# Patient Record
Sex: Female | Born: 1965 | Race: Black or African American | Hispanic: No | Marital: Married | Smoking: Never smoker
Health system: Southern US, Community
[De-identification: ages and names within clinical notes are randomized; demographics above are authoritative.]

## PROBLEM LIST (undated history)

## (undated) DIAGNOSIS — I10 Essential (primary) hypertension: Secondary | ICD-10-CM

## (undated) DIAGNOSIS — C50919 Malignant neoplasm of unspecified site of unspecified female breast: Secondary | ICD-10-CM

## (undated) DIAGNOSIS — H547 Unspecified visual loss: Secondary | ICD-10-CM

## (undated) DIAGNOSIS — N189 Chronic kidney disease, unspecified: Secondary | ICD-10-CM

## (undated) DIAGNOSIS — D571 Sickle-cell disease without crisis: Secondary | ICD-10-CM

## (undated) DIAGNOSIS — D649 Anemia, unspecified: Secondary | ICD-10-CM

## (undated) HISTORY — DX: Chronic kidney disease, unspecified: N18.9

## (undated) HISTORY — DX: Malignant neoplasm of unspecified site of unspecified female breast: C50.919

## (undated) HISTORY — PX: COLONOSCOPY: SHX174

## (undated) HISTORY — DX: Sickle-cell disease without crisis: D57.1

## (undated) HISTORY — PX: GANGLION CYST EXCISION: SHX1691

## (undated) HISTORY — PX: EYE SURGERY: SHX253

## (undated) HISTORY — PX: RETINAL DETACHMENT SURGERY: SHX105

---

## 1998-10-23 ENCOUNTER — Other Ambulatory Visit: Admission: RE | Admit: 1998-10-23 | Discharge: 1998-10-23 | Payer: Self-pay | Admitting: Obstetrics

## 1999-08-12 ENCOUNTER — Encounter: Payer: Self-pay | Admitting: Cardiology

## 1999-08-12 ENCOUNTER — Ambulatory Visit (HOSPITAL_COMMUNITY): Admission: RE | Admit: 1999-08-12 | Discharge: 1999-08-12 | Payer: Self-pay | Admitting: Cardiology

## 2000-05-19 ENCOUNTER — Other Ambulatory Visit: Admission: RE | Admit: 2000-05-19 | Discharge: 2000-05-19 | Payer: Self-pay | Admitting: Obstetrics

## 2000-05-27 ENCOUNTER — Encounter: Payer: Self-pay | Admitting: Obstetrics

## 2000-05-27 ENCOUNTER — Ambulatory Visit (HOSPITAL_COMMUNITY): Admission: RE | Admit: 2000-05-27 | Discharge: 2000-05-27 | Payer: Self-pay | Admitting: Obstetrics

## 2000-05-28 ENCOUNTER — Encounter (INDEPENDENT_AMBULATORY_CARE_PROVIDER_SITE_OTHER): Payer: Self-pay

## 2000-05-28 ENCOUNTER — Ambulatory Visit (HOSPITAL_COMMUNITY): Admission: RE | Admit: 2000-05-28 | Discharge: 2000-05-28 | Payer: Self-pay | Admitting: Obstetrics

## 2000-06-05 ENCOUNTER — Encounter: Admission: RE | Admit: 2000-06-05 | Discharge: 2000-06-05 | Payer: Self-pay | Admitting: Internal Medicine

## 2000-06-05 ENCOUNTER — Encounter: Payer: Self-pay | Admitting: Internal Medicine

## 2000-09-28 ENCOUNTER — Emergency Department (HOSPITAL_COMMUNITY): Admission: EM | Admit: 2000-09-28 | Discharge: 2000-09-28 | Payer: Self-pay | Admitting: Emergency Medicine

## 2000-12-24 ENCOUNTER — Encounter: Admission: RE | Admit: 2000-12-24 | Discharge: 2000-12-24 | Payer: Self-pay | Admitting: Gynecology

## 2000-12-24 ENCOUNTER — Encounter: Payer: Self-pay | Admitting: Gynecology

## 2003-11-24 ENCOUNTER — Emergency Department (HOSPITAL_COMMUNITY): Admission: EM | Admit: 2003-11-24 | Discharge: 2003-11-24 | Payer: Self-pay | Admitting: Emergency Medicine

## 2007-06-23 ENCOUNTER — Other Ambulatory Visit: Admission: RE | Admit: 2007-06-23 | Discharge: 2007-06-23 | Payer: Self-pay | Admitting: Gynecology

## 2007-06-28 ENCOUNTER — Ambulatory Visit (HOSPITAL_COMMUNITY): Admission: RE | Admit: 2007-06-28 | Discharge: 2007-06-28 | Payer: Self-pay | Admitting: Gynecology

## 2008-03-10 ENCOUNTER — Emergency Department (HOSPITAL_COMMUNITY): Admission: EM | Admit: 2008-03-10 | Discharge: 2008-03-10 | Payer: Self-pay | Admitting: Emergency Medicine

## 2009-12-07 ENCOUNTER — Ambulatory Visit (HOSPITAL_COMMUNITY): Admission: RE | Admit: 2009-12-07 | Discharge: 2009-12-07 | Payer: Self-pay | Admitting: Obstetrics & Gynecology

## 2011-02-04 ENCOUNTER — Other Ambulatory Visit: Payer: Self-pay | Admitting: Family Medicine

## 2011-02-04 DIAGNOSIS — Z1231 Encounter for screening mammogram for malignant neoplasm of breast: Secondary | ICD-10-CM

## 2011-02-07 ENCOUNTER — Ambulatory Visit (HOSPITAL_COMMUNITY)
Admission: RE | Admit: 2011-02-07 | Discharge: 2011-02-07 | Disposition: A | Discharge status: Home / Self Care | Payer: Medicaid Other | Source: Ambulatory Visit | Attending: Family Medicine | Admitting: Family Medicine

## 2011-02-07 DIAGNOSIS — Z1231 Encounter for screening mammogram for malignant neoplasm of breast: Secondary | ICD-10-CM | POA: Insufficient documentation

## 2011-04-18 NOTE — Op Note (Signed)
[facility]  Patient:    Sarah Kline                      MRN: [id number] Proc. Date: 05/28/00 Adm. Date:  [id number] Attending:  Beatrix Fetters                           Operative Report  PREOPERATIVE DIAGNOSIS:       Dysfunctional uterine bleeding.  POSTOPERATIVE DIAGNOSIS:  OPERATION:                    Diagnostic D&C.  SURGEON:                      Frederico Hamman, M.D.  ASSISTANT:  ANESTHESIA:                   General anesthesia.  ESTIMATED BLOOD LOSS:  DESCRIPTION OF PROCEDURE:     Under general anesthesia with the patient in the lithotomy position, the perineum and vagina were prepped and draped.  The bladder was emptied with a straight catheter.  Bimanual examination revealed the uterus to be 12 to 14 weeks size.  Weighted speculum was placed in the vagina.  The cervix injected with 1% Xylocaine at 3 and 9 oclock.  The endocervical canal was curetted.  A small amount of tissue obtained.  Endometrial cavity sounded to 10 cm and the cervix dilated to #29 [name].  Sharp curettage was performed.  A moderate amount of tissue obtained.  Polyp forceps were inserted and no polyps were obtained. The patient tolerated the procedure well and taken to the recovery room in good condition. DD:  05/28/00 TD:  05/28/00 Job: [id number]

## 2012-07-30 ENCOUNTER — Emergency Department (HOSPITAL_COMMUNITY)
Admission: EM | Admit: 2012-07-30 | Discharge: 2012-07-31 | Disposition: A | Discharge status: Home / Self Care | Payer: Medicaid Other | Attending: Emergency Medicine | Admitting: Emergency Medicine

## 2012-07-30 ENCOUNTER — Encounter (HOSPITAL_COMMUNITY): Payer: Self-pay | Admitting: Family Medicine

## 2012-07-30 DIAGNOSIS — R42 Dizziness and giddiness: Secondary | ICD-10-CM | POA: Insufficient documentation

## 2012-07-30 DIAGNOSIS — H532 Diplopia: Secondary | ICD-10-CM | POA: Insufficient documentation

## 2012-07-30 DIAGNOSIS — E119 Type 2 diabetes mellitus without complications: Secondary | ICD-10-CM | POA: Insufficient documentation

## 2012-07-30 DIAGNOSIS — R51 Headache: Secondary | ICD-10-CM | POA: Insufficient documentation

## 2012-07-30 LAB — BASIC METABOLIC PANEL
CO2: 27 mEq/L (ref 19–32)
Calcium: 9.5 mg/dL (ref 8.4–10.5)
Creatinine, Ser: 0.69 mg/dL (ref 0.50–1.10)
GFR calc Af Amer: >90 mL/min (ref >90)
GFR calc non Af Amer: >90 mL/min (ref >90)
Sodium: 135 mEq/L (ref 135–145)

## 2012-07-30 LAB — CBC
MCH: 29.1 pg (ref 26.0–34.0)
MCHC: 34.9 g/dL (ref 30.0–36.0)
MCV: 83.5 fL (ref 78.0–100.0)
Platelets: 322 10*3/uL (ref 150–400)
RBC: 4.12 MIL/uL (ref 3.87–5.11)
RDW: 11.9 % (ref 11.5–15.5)

## 2012-07-30 NOTE — ED Notes (Signed)
Pt waiting quietly, no complaints

## 2012-07-30 NOTE — ED Notes (Signed)
Resident at bedside.  

## 2012-07-30 NOTE — ED Notes (Addendum)
Patient complaining of dizziness upon standing, blurry/double vision, and left sided facial pain for the last two days.  Describes pain as "pressure" or a "weight"; rates pain 6/10.  Patient reports that most the pain feels that it is behind the eye.  Patient feels that she can see better out of her right eye than her left eye.  Patient denies weakness, sensory loss, chest pain, shortness of breath, and nausea/vomiting.  Patient alert and oriented x4; PERRL present.  Hand grips are bilaterally equal and strong.  No facial droop present; smile symmetrical.  Denies increased urination; denies increased thirst.  iNo slurred speech noted.  Denies history of diabetes.  Reports family history of stroke (mother).  Family present at bedside.  Will continue to monitor.

## 2012-07-30 NOTE — ED Notes (Signed)
Per pt she has had double vision x 2 days. Pt also having pain in left eye, face and pressure.

## 2012-07-30 NOTE — ED Provider Notes (Signed)
History     CSN: [id number]  Arrival date & time 07/30/12  1803   First MD Initiated Contact with Patient 07/30/12 2203      Chief Complaint  Patient presents with  . Diplopia    (Consider location/radiation/quality/duration/timing/severity/associated sxs/prior treatment) HPI This is a 46 year old woman, with a past medical history of untreated diabetes for the last 4 years, who presents with "pressure behind her left eye." The symptoms started suddenly yesterday, when she was driving her car and they were associated with sensations of tingling on the left side of her face. She also reports in seeing double in her eyes and reduced vision in both eyes. However, the left eye is more affected than the right. She denies difficulty with speech, tremors, facial asymmetry, or weakness in and of her limbs. She reports feeling dizzy on standing out, but she has not passed out. She has no previous history of a stroke. She denies headache. The symptoms have remained the same since yesterday. She does not have history of similar symptoms.  She has not been seeing doctors since she lost her job 4 years ago. However, she reports that she has been feeling well despite no treatment for Diabetes.   Past Medical History  Diagnosis Date  . Diabetes mellitus     History reviewed. No pertinent past surgical history.  History reviewed. No pertinent family history.  History  Substance Use Topics  . Smoking status: Never Smoker   . Smokeless tobacco: Not on file  . Alcohol Use: No    OB History    Grav Para Term Preterm Abortions TAB SAB Ect Mult Living                  Review of Systems  Constitutional: Negative.   Eyes: Negative.   Respiratory: Negative for stridor.   Cardiovascular: Negative.   Gastrointestinal: Negative.   Genitourinary: Negative.   Musculoskeletal: Negative.   Skin: Negative.   Neurological: Negative.   Hematological: Negative.   Psychiatric/Behavioral: Negative.       Allergies  Bactrim  Home Medications  No current outpatient prescriptions on file.  BP 170/76  Pulse 86  Temp 98.1 F (36.7 C) (Oral)  Resp 16  SpO2 99%  LMP 07/17/2012  Physical Exam  Constitutional: She is oriented to person, place, and time. She appears well-developed and well-nourished.  HENT:  Head: Normocephalic and atraumatic.  Eyes: Conjunctivae and EOM are normal. Pupils are equal, round, and reactive to light.  Neck: Normal range of motion.  Cardiovascular: Normal rate, regular rhythm and normal heart sounds.   Pulmonary/Chest: Effort normal and breath sounds normal.  Abdominal: Soft. Bowel sounds are normal.  Neurological: She is alert and oriented to person, place, and time. She has normal strength. She displays no tremor. No cranial nerve deficit or sensory deficit. She displays no seizure activity. GCS eye subscore is 4. GCS verbal subscore is 5. GCS motor subscore is 6.  Skin: Skin is warm and dry.  Psychiatric: She has a normal mood and affect.    ED Course  Procedures (including critical care time)  Labs Reviewed  CBC - Abnormal; Notable for the following:    HCT 34.4 (*)     All other components within normal limits  BASIC METABOLIC PANEL - Abnormal; Notable for the following:    Glucose, Bld 344 (*)     All other components within normal limits   Ct Head Wo Contrast  07/31/2012  *RADIOLOGY REPORT*  Clinical  Data: Blurry vision, dizziness and left-sided facial pain.  CT HEAD WITHOUT CONTRAST  Technique:  Contiguous axial images were obtained from the base of the skull through the vertex without contrast.  Comparison: None.  Findings: The lateral ventricles and third ventricle are somewhat prominent without evidence of surrounding edema.  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  No evidence of infarction, hemorrhage or extra-axial fluid collection.  No visible mass lesions.  The skull is  within normal limits.  IMPRESSION: Prominent lateral ventricles and third ventricle.  This is of unclear significance.  There is no evidence to suggest acute transependymal edema.  Prior imaging would be helpful, if available.   Original Report Authenticated By: Azzie Roup, M.D.      No diagnosis found.    MDM  In this 46 year old woman, who presents with unspecific symptoms including pressure behind her left eye, and numbness/tingling on the left side of her face, with a pretty normal physical examination, there is a remote possibility for this to be a stroke. Patient however, doesn't have significant risk factors except untreated diabetes. Her ABCD2 Score is 4 which indicates a 7 day risk of a stroke of 5.9%.  I would do brain CT scan. Patient has declined a CT scan even though she understands the risk for a stroke. She has been found to have high blood sugar. I have advised her to call the numbers provide in her discharge documents for a Primary care physician.  12:39 AM Negative Brain CT scan.  I have discharged her. She will follow up with an ophthalmologist Dr Zadie Rhine for more evaluation.           Jessee Avers, MD 07/31/12 [phone]

## 2012-07-31 ENCOUNTER — Emergency Department (HOSPITAL_COMMUNITY): Payer: Medicaid Other

## 2012-07-31 MED ORDER — OXYCODONE-ACETAMINOPHEN 7.5-500 MG PO TABS: 1.0000 | ORAL_TABLET | ORAL | Status: AC | PRN

## 2012-07-31 MED ORDER — OXYCODONE-ACETAMINOPHEN 5-325 MG PO TABS
2.0000 | ORAL_TABLET | Freq: Once | ORAL | Status: AC
  Administered 2012-07-31: 2 via ORAL
  Filled 2012-07-31: qty 2

## 2012-07-31 MED ORDER — BENAZEPRIL-HYDROCHLOROTHIAZIDE 10-12.5 MG PO TABS: 1.0000 | ORAL_TABLET | Freq: Every day | ORAL | Status: DC

## 2012-07-31 NOTE — ED Notes (Signed)
Resident at bedside speaking with pt.  Awaiting Rx for blood pressure medication.

## 2012-07-31 NOTE — ED Provider Notes (Signed)
Medical screening examination/treatment/procedure(s) were conducted as a shared visit with non-physician practitioner(s) and myself.  I personally evaluated the patient during the encounter.  No stroke-like findings reproduced on exam.  No clinical evidence of acute glaucoma.  Plan d/c home.  Perlie Mayo, MD 07/31/12 [phone]

## 2012-07-31 NOTE — ED Notes (Signed)
Resident states pt ready for discharge.  Informed resident of pt's BP and no additional orders received.  States pt can be discharged.

## 2012-11-12 ENCOUNTER — Ambulatory Visit (INDEPENDENT_AMBULATORY_CARE_PROVIDER_SITE_OTHER): Payer: Self-pay | Admitting: Family Medicine

## 2012-11-12 ENCOUNTER — Encounter: Payer: Self-pay | Admitting: Family Medicine

## 2012-11-12 VITALS — BP 168/86 | HR 100 | Temp 99.1°F | Ht 64.0 in | Wt 225.3 lb

## 2012-11-12 DIAGNOSIS — E119 Type 2 diabetes mellitus without complications: Secondary | ICD-10-CM | POA: Insufficient documentation

## 2012-11-12 DIAGNOSIS — H4920 Sixth [abducent] nerve palsy, unspecified eye: Secondary | ICD-10-CM | POA: Insufficient documentation

## 2012-11-12 DIAGNOSIS — I10 Essential (primary) hypertension: Secondary | ICD-10-CM | POA: Insufficient documentation

## 2012-11-12 DIAGNOSIS — E1165 Type 2 diabetes mellitus with hyperglycemia: Secondary | ICD-10-CM | POA: Insufficient documentation

## 2012-11-12 LAB — POCT GLYCOSYLATED HEMOGLOBIN (HGB A1C): Hemoglobin A1C: 7.7

## 2012-11-12 MED ORDER — LISINOPRIL-HYDROCHLOROTHIAZIDE 10-12.5 MG PO TABS: 1.0000 | ORAL_TABLET | Freq: Every day | ORAL | Status: DC

## 2012-11-12 MED ORDER — GLIPIZIDE 5 MG PO TABS: 5.0000 mg | ORAL_TABLET | Freq: Every day | ORAL | Status: DC

## 2012-11-12 MED ORDER — METFORMIN HCL 500 MG PO TABS: 1500.0000 mg | ORAL_TABLET | Freq: Every day | ORAL | Status: DC

## 2012-11-12 NOTE — Patient Instructions (Signed)
Thank you for coming in, today! It was nice to meet you. For now, keep taking your metformin and glipizide as before. We might adjust it in the future. We will check an A1c and your kidney and liver function labs, today. Make a lab appointment to come back next week for a fasting lab draw (come in the morning before breakfast and do not eat or drink anything after midnight). Make an appointment to come back to see me a week or two after that. If you have any questions/concerns, please feel free to call at any time. --Dr. Venetia Maxon

## 2012-11-12 NOTE — Assessment & Plan Note (Signed)
A: Uncontrolled, not currently taking any medications. Elevated today, systolic to [id number], diastolic to [id number]. P: Rx for lisinopril-HCTZ given. Will check CMP today for renal function baseline, then recheck in 1 week to assess for increased Cr or K. Will follow closely and may consider referral to Dr. Valentina Lucks for ambulatory monitoring.

## 2012-11-12 NOTE — Assessment & Plan Note (Signed)
A/P: Not specifically evaluated by me, today. Pt to keep following up with ophthalmology. Will request records/re-evaluate at follow-up, here.

## 2012-11-12 NOTE — Assessment & Plan Note (Signed)
A: Unclear control, but documented blood glucose over 300 in ED in August. Reports compliance with metformin but is only taking it once per day (non-XL formulation). Also on glipizide. P: Will check A1c today and fasting lipid panel at lab draw appointment for recheck of renal function after starting ACE (see HTN problem list note). Will adjust medications as appropriate.

## 2012-11-12 NOTE — Progress Notes (Signed)
  Subjective:    Patient ID: Sarah Kline, female    DOB: 10/20/1966, 46 y.o.   MRN: [id number]  HPI: Pt is a 46yo female presenting to establish care. She has not seen a physician in several years.  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 She denies headaches, chest pain, SOB, or swelling in her LE.  DM - Pt has known type 2 DM. She does not check her sugar at home regularly; her daughter has DM and a meter, and she occasionally uses her daughter's meter. She reports compliance with metformin 1500 mg once per day (regular, not XR) and glipizide 5 mg once per day. She takes these medications at night and does not report GI upset with the metformin. 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 Of note, BP was elevated at the time and pt had a headache with double vision/blurred vision. She is being followed by ophthalmology and had to cancel an appointment last week, which is to be rescheduled. She wears a left eye patch at home and glasses with a left darkened lens when she drives.  Review of Systems: As above. Otherwise denies fever/chills, N/V, but does have chronic constipation, with only "a couple BM's a week, usually." She states she occasionally breaks into a sweat when she eats, only in her face and head. She also complains of occasional numbness in the 4th and 5th digits of her left hand, usually worse when she wakes up.     Objective:   Physical Exam BP 156/102  Pulse 100  Temp 99.1 F (37.3 C) (Oral)  Ht 5\' 4"  (1.626 m)  Wt 225 lb 4.8 oz (102.195 kg)  BMI 38.67 kg/m2 BP rechecked manually by me to be 168/86,  172/88. Gen: well-appearing adult female in NAD, overweight Pulm: CTAB, no wheezes Card: RRR, soft blowing systolic murmur (known to pt, states she has had it "since she was a child") Abd: Soft, nontender, nondistended, BS+ Ext: distal pulses 2+ and intact/symmetric     Assessment & Plan:

## 2012-11-13 LAB — COMPREHENSIVE METABOLIC PANEL
ALT: 14 U/L (ref 0–35)
Alkaline Phosphatase: 97 U/L (ref 39–117)
Creat: 0.74 mg/dL (ref 0.50–1.10)
Sodium: 139 mEq/L (ref 135–145)
Total Bilirubin: 0.5 mg/dL (ref 0.3–1.2)
Total Protein: 6.6 g/dL (ref 6.0–8.3)

## 2012-11-19 ENCOUNTER — Encounter: Payer: Self-pay | Admitting: Emergency Medicine

## 2012-11-19 ENCOUNTER — Ambulatory Visit (INDEPENDENT_AMBULATORY_CARE_PROVIDER_SITE_OTHER): Payer: Self-pay | Admitting: Emergency Medicine

## 2012-11-19 VITALS — BP 155/95 | HR 91 | Ht 64.0 in | Wt 218.0 lb

## 2012-11-19 DIAGNOSIS — I10 Essential (primary) hypertension: Secondary | ICD-10-CM

## 2012-11-19 DIAGNOSIS — E1165 Type 2 diabetes mellitus with hyperglycemia: Secondary | ICD-10-CM

## 2012-11-19 LAB — BASIC METABOLIC PANEL
BUN: 17 mg/dL (ref 6–23)
Chloride: 104 mEq/L (ref 96–112)
Creat: 0.91 mg/dL (ref 0.50–1.10)
Potassium: 4.3 mEq/L (ref 3.5–5.3)

## 2012-11-19 LAB — LIPID PANEL
Cholesterol: 190 mg/dL (ref 0–200)
LDL Cholesterol: 132 mg/dL — ABNORMAL HIGH (ref 0–99)
Total CHOL/HDL Ratio: 4 Ratio
VLDL: 11 mg/dL (ref 0–40)

## 2012-11-19 MED ORDER — METFORMIN HCL 500 MG PO TABS: 1000.0000 mg | ORAL_TABLET | Freq: Two times a day (BID) | ORAL | Status: DC

## 2012-11-19 MED ORDER — LISINOPRIL-HYDROCHLOROTHIAZIDE 20-25 MG PO TABS: 1.0000 | ORAL_TABLET | Freq: Every day | ORAL | Status: DC

## 2012-11-19 NOTE — Progress Notes (Signed)
  Subjective:    Patient ID: Sarah Kline, female    DOB: 10/14/1966, 46 y.o.   MRN: [id number]  HPI RONEE RISHER is here for f/u of HTN and DM.  Hypertension Well controlled: yes Compliant with medication: yes Side effects from medication: no Check BP at home: no  Chest pain: no Palpitations: no Vision changes: no Leg edema: no Dizziness: no  Diabetes Well controlled: no Compliant with medications: yes Side effects from medications: no Check sugars at home: no  Hypoglycemic symptoms: yes sometimes feels jittery in the early afternoon  I have reviewed and updated the following as appropriate: allergies and current medications SHx: never smoker   Review of Systems see HPI   Objective:   Physical Exam BP 155/95  Pulse 91  Ht 5\' 4"  (1.626 m)  Wt 218 lb (98.884 kg)  BMI 37.42 kg/m2  LMP 11/19/2012 Gen: alert, cooperative, NAD HEENT: AT/[place], sclera white, MMM Neck: supple, no LAD CV: RRR, no murmurs Pulm: CTAB, no wheezes or rales Ext: no edema, 2+ DP pulses bilaterally      Assessment & Plan:

## 2012-11-19 NOTE — Assessment & Plan Note (Signed)
Poorly controlled today despite starting lisinopril-HCTZ 10-12.5mg .  Will increase to 20-25mg  daily.  BMP and lipids drawn today.  Follow up with Dr. Venetia Maxon in 1-2 weeks.

## 2012-11-19 NOTE — Patient Instructions (Addendum)
It was nice to see you! We are increasing your blood pressure medicine.  Please take 2 tablets daily.  When you refill the prescription it will be the stronger strength and you can go back to taking 1 a day. Please take Metformin 2 tablets with breakfast and 2 tablets with dinner.  If you are having a very light dinner just take 1 tablet. Follow up with Dr. Venetia Maxon in 1-2 weeks to go over your lab results.

## 2012-11-19 NOTE — Assessment & Plan Note (Signed)
A1c of 7.7%.  Will change metformin to 1,000mg  BID.  Discussed with patient that taking it once a day only covers breakfast and lunch.  Okay to take 1 tablet at dinner if eating a small meal.  Follow up with PCP.

## 2015-05-07 ENCOUNTER — Emergency Department (HOSPITAL_COMMUNITY): Payer: Medicaid Other

## 2015-05-07 ENCOUNTER — Emergency Department (HOSPITAL_COMMUNITY)
Admission: EM | Admit: 2015-05-07 | Discharge: 2015-05-08 | Disposition: A | Discharge status: Home / Self Care | Payer: Medicaid Other | Attending: Emergency Medicine | Admitting: Emergency Medicine

## 2015-05-07 DIAGNOSIS — Z79899 Other long term (current) drug therapy: Secondary | ICD-10-CM | POA: Insufficient documentation

## 2015-05-07 DIAGNOSIS — N179 Acute kidney failure, unspecified: Secondary | ICD-10-CM

## 2015-05-07 DIAGNOSIS — E119 Type 2 diabetes mellitus without complications: Secondary | ICD-10-CM | POA: Insufficient documentation

## 2015-05-07 DIAGNOSIS — Z862 Personal history of diseases of the blood and blood-forming organs and certain disorders involving the immune mechanism: Secondary | ICD-10-CM | POA: Insufficient documentation

## 2015-05-07 DIAGNOSIS — M549 Dorsalgia, unspecified: Secondary | ICD-10-CM | POA: Insufficient documentation

## 2015-05-07 DIAGNOSIS — I10 Essential (primary) hypertension: Secondary | ICD-10-CM | POA: Insufficient documentation

## 2015-05-07 LAB — BASIC METABOLIC PANEL
Anion gap: 12 (ref 5–15)
BUN: 25 mg/dL — AB (ref 6–20)
CALCIUM: 8.8 mg/dL — AB (ref 8.9–10.3)
CHLORIDE: 103 mmol/L (ref 101–111)
CO2: 22 mmol/L (ref 22–32)
CREATININE: 2.12 mg/dL — AB (ref 0.44–1.00)
GFR calc non Af Amer: 26 mL/min — ABNORMAL LOW (ref >60)
GFR, EST AFRICAN AMERICAN: 30 mL/min — AB (ref >60)
GLUCOSE: 147 mg/dL — AB (ref 65–99)
Potassium: 4.8 mmol/L (ref 3.5–5.1)
SODIUM: 137 mmol/L (ref 135–145)

## 2015-05-07 LAB — I-STAT TROPONIN, ED: TROPONIN I, POC: 0.04 ng/mL (ref 0.00–0.08)

## 2015-05-07 LAB — CBC
HCT: 30.3 % — ABNORMAL LOW (ref 36.0–46.0)
Hemoglobin: 10.2 g/dL — ABNORMAL LOW (ref 12.0–15.0)
MCH: 28.7 pg (ref 26.0–34.0)
MCHC: 33.7 g/dL (ref 30.0–36.0)
MCV: 85.4 fL (ref 78.0–100.0)
PLATELETS: 453 10*3/uL — AB (ref 150–400)
RBC: 3.55 MIL/uL — ABNORMAL LOW (ref 3.87–5.11)
RDW: 12.7 % (ref 11.5–15.5)
WBC: 11.1 10*3/uL — AB (ref 4.0–10.5)

## 2015-05-07 MED ORDER — LISINOPRIL 10 MG PO TABS: 10.0000 mg | ORAL_TABLET | Freq: Every day | ORAL | Status: DC

## 2015-05-07 MED ORDER — SODIUM CHLORIDE 0.9 % IV BOLUS (SEPSIS)
1000.0000 mL | Freq: Once | INTRAVENOUS | Status: AC
  Administered 2015-05-07: 1000 mL via INTRAVENOUS

## 2015-05-07 NOTE — Discharge Instructions (Signed)
As discussed, it is important that you follow up with your physician for continued management of your condition.  Please stop taking the Zestoretic, and initiate lisinopril therapy until you have seen your physician.  If you develop any new, or concerning changes in your condition, please return to the emergency department immediately.   Acute Kidney Injury Acute kidney injury is a disease in which there is sudden (acute) damage to the kidneys. The kidneys are 2 organs that lie on either side of the spine between the middle of the back and the front of the abdomen. The kidneys:  Remove wastes and extra water from the blood.   Produce important hormones. These help keep bones strong, regulate blood pressure, and help create red blood cells.   Balance the fluids and chemicals in the blood and tissues. A small amount of kidney damage may not cause problems, but a large amount of damage may make it difficult or impossible for the kidneys to work the way they should. Acute kidney injury may develop into long-lasting (chronic) kidney disease. It may also develop into a life-threatening disease called end-stage kidney disease. Acute kidney injury can get worse very quickly, so it should be treated right away. Early treatment may prevent other kidney diseases from developing.  CAUSES   A problem with blood flow to the kidneys. This may be caused by:   Blood loss.   Heart disease.   Severe burns.   Liver disease.  Direct damage to the kidneys. This may be caused by:  Some medicines.   A kidney infection.   Poisoning or consuming toxic substances.   A surgical wound.   A blow to the kidney area.   A problem with urine flow. This may be caused by:   Cancer.   Kidney stones.   An enlarged prostate. SYMPTOMS   Swelling (edema) of the legs, ankles, or feet.   Tiredness (lethargy).   Nausea or vomiting.   Confusion.   Problems with urination, such as:    Painful or burning feeling during urination.   Decreased urine production.   Frequent accidents in children who are potty trained.   Bloody urine.   Muscle twitches and cramps.   Shortness of breath.   Seizures.   Chest pain or pressure. Sometimes, no symptoms are present. DIAGNOSIS Acute kidney injury may be detected and diagnosed by tests, including blood, urine, imaging, or kidney biopsy tests.  TREATMENT Treatment of acute kidney injury varies depending on the cause and severity of the kidney damage. In mild cases, no treatment may be needed. The kidneys may heal on their own. If acute kidney injury is more severe, your caregiver will treat the cause of the kidney damage, help the kidneys heal, and prevent complications from occurring. Severe cases may require a procedure to remove toxic wastes from the body (dialysis) or surgery to repair kidney damage. Surgery may involve:   Repair of a torn kidney.   Removal of an obstruction. Most of the time, you will need to stay overnight at the hospital.  HOME CARE INSTRUCTIONS:  Follow your prescribed diet.  Only take over-the-counter or prescription medicines as directed by your caregiver.  Do not take any new medicines (prescription, over-the-counter, or nutritional supplements) unless approved by your caregiver. Many medicines can worsen your kidney damage or need to have the dose adjusted.   Keep all follow-up appointments as directed by your caregiver.  Observe your condition to make sure you are healing as expected. SEEK  IMMEDIATE MEDICAL CARE IF:  You are feeling ill or have severe pain in the back or side.   Your symptoms return or you have new symptoms.  You have any symptoms of end-stage kidney disease. These include:   Persistent itchiness.   Loss of appetite.   Headaches.   Abnormally dark or light skin.  Numbness in the hands or feet.   Easy bruising.   Frequent hiccups.    Menstruation stops.   You have a fever.  You have increased urine production.  You have pain or bleeding when urinating. MAKE SURE YOU:   Understand these instructions.  Will watch your condition.  Will get help right away if you are not doing well or get worse Document Released: 06/02/2011 Document Revised: 03/14/2013 Document Reviewed: 07/16/2012 [facility] Patient Information 2015 [place], [place]. This information is not intended to replace advice given to you by your health care provider. Make sure you discuss any questions you have with your health care provider.

## 2015-05-07 NOTE — ED Provider Notes (Signed)
CSN: [id number]     Arrival date & time 05/07/15  1746 History   First MD Initiated Contact with Patient 05/07/15 2045     Chief Complaint  Patient presents with  . Chest Pain     (Consider location/radiation/quality/duration/timing/severity/associated sxs/prior Treatment) HPI Patient presents with concern of urinary changes, intermittent abdominal pain, chest pain. Patient was well until about one week ago. Initially she has a left mid back pain. Subsequent to lower abdominal pain, and several days later has had episodic left chest pain. No pain is persistent, and currently she has no pain at all. During this time the patient has noticed decreased urine output. States all medication as directed, has not taken any new medication for her pain. No ongoing dyspnea, fever, chills, confusion, disorientation. History of renal disease, though she does have history of hypertension, and diabetes.  Past Medical History  Diagnosis Date  . Diabetes mellitus   . Sickle cell disease, type S    Past Surgical History  Procedure Laterality Date  . Ganglion cyst excision      Left dorsal wrist   Family History  Problem Relation Age of Onset  . Asthma Mother   . COPD Mother   . Hypertension Mother   . Stroke Mother    History  Substance Use Topics  . Smoking status: Never Smoker   . Smokeless tobacco: Never Used  . Alcohol Use: No   OB History    No data available     Review of Systems  Constitutional:       Per HPI, otherwise negative  HENT:       Per HPI, otherwise negative  Respiratory:       Per HPI, otherwise negative  Cardiovascular:       Per HPI, otherwise negative  Gastrointestinal: Negative for vomiting.  Endocrine:       Negative aside from HPI  Genitourinary:       Neg aside from HPI   Musculoskeletal:       Per HPI, otherwise negative  Skin: Negative.   Neurological: Negative for syncope.      Allergies  Bactrim  Home Medications   Prior to  Admission medications   Medication Sig Start Date End Date Taking? Authorizing Provider  lisinopril-hydrochlorothiazide (PRINZIDE,ZESTORETIC) 10-12.5 MG per tablet Take 1 tablet by mouth daily. 04/18/15  Yes Historical Provider, MD  metFORMIN (GLUCOPHAGE) 500 MG tablet Take 2 tablets (1,000 mg total) by mouth 2 (two) times daily with a meal. 11/19/12  Yes Melony Overly, MD  glipiZIDE (GLUCOTROL) 5 MG tablet Take 1 tablet (5 mg total) by mouth daily. Patient not taking: Reported on 05/07/2015 11/12/12   Sharon [address], MD   BP 150/69 mmHg  Pulse 86  Temp(Src) 99.4 F (37.4 C) (Oral)  Resp 19  SpO2 99%  LMP 04/06/2015 (Approximate) Physical Exam  Constitutional: She is oriented to person, place, and time. She appears well-developed and well-nourished. No distress.  HENT:  Head: Normocephalic and atraumatic.  Eyes: Conjunctivae and EOM are normal.  Cardiovascular: Normal rate and regular rhythm.   Pulmonary/Chest: Effort normal and breath sounds normal. No stridor. No respiratory distress.  Abdominal: She exhibits no distension. There is no tenderness. There is no rebound and no guarding.  Musculoskeletal: She exhibits no edema.  Neurological: She is alert and oriented to person, place, and time. No cranial nerve deficit.  Skin: Skin is warm and dry.  Psychiatric: She has a normal mood and affect.  Nursing note  and vitals reviewed.   ED Course  Procedures (including critical care time) Labs Review Labs Reviewed  CBC - Abnormal; Notable for the following:    WBC 11.1 (*)    RBC 3.55 (*)    Hemoglobin 10.2 (*)    HCT 30.3 (*)    Platelets 453 (*)    All other components within normal limits  BASIC METABOLIC PANEL - Abnormal; Notable for the following:    Glucose, Bld 147 (*)    BUN 25 (*)    Creatinine, Ser 2.12 (*)    Calcium 8.8 (*)    GFR calc non Af Amer 26 (*)    GFR calc Af Amer 30 (*)    All other components within normal limits  I-STAT TROPOININ, ED    Imaging  Review Dg Chest 2 View  05/07/2015   CLINICAL DATA:  Right-sided chest tightness.  Nonsmoker.  EXAM: CHEST  2 VIEW  COMPARISON:  None  FINDINGS: The heart size and mediastinal contours are within normal limits. Both lungs are clear. Spondylosis noted within the thoracic spine.  IMPRESSION: No active cardiopulmonary disease.   Electronically Signed   By: Kerby Moors M.D.   On: 05/07/2015 19:03     EKG Interpretation   Date/Time:  Monday May 07 2015 19:08:57 EDT Ventricular Rate:  79 PR Interval:  133 QRS Duration: 68 QT Interval:  374 QTC Calculation: 429 R Axis:   50 Text Interpretation:  Sinus rhythm Left atrial enlargement Sinus rhythm T  wave abnormality Left atrial enlargement Borderline ECG Confirmed by  Carmin Muskrat  MD ([id number]) on 05/07/2015 8:46:35 PM     The patient received IV fluids after initial labs were notable for decreased renal function. A lengthy conversation, and I advised for CT scan to exclude obstruction, versus other renal pathology. Patient deferred this recommendation, will have fluid resuscitation, follow up with her physician in 2 days.  11:31 PM Patient smiling MDM   Patient presents with new episodic abdominal and chest pain, as well as oliguria. Patient's initial labs demonstrate acute renal decline in function. With concern for possible obstruction or other etiology, I advocated for CT scan. Patient deferred this recommendation, did acquiesce to fluid resuscitation, monitoring. This occurred without complication. After a lengthy conversation with her aunt to family members about return precautions, follow-up instructions she was discharged in stable condition.   Carmin Muskrat, MD 05/07/15 [phone]

## 2015-05-07 NOTE — ED Notes (Signed)
Pt c/o right sided chest tightness and oliguria. Denies SOB.

## 2015-12-02 DIAGNOSIS — I639 Cerebral infarction, unspecified: Secondary | ICD-10-CM

## 2015-12-02 HISTORY — DX: Cerebral infarction, unspecified: I63.9

## 2016-10-30 ENCOUNTER — Encounter: Payer: Medicaid Other | Admitting: Family Medicine

## 2016-11-12 ENCOUNTER — Encounter: Payer: Self-pay | Admitting: Family Medicine

## 2016-11-12 ENCOUNTER — Ambulatory Visit (INDEPENDENT_AMBULATORY_CARE_PROVIDER_SITE_OTHER): Payer: Medicaid Other | Admitting: Family Medicine

## 2016-11-12 VITALS — BP 176/92 | HR 78 | Temp 98.9°F | Ht 64.0 in | Wt 215.8 lb

## 2016-11-12 DIAGNOSIS — R229 Localized swelling, mass and lump, unspecified: Secondary | ICD-10-CM

## 2016-11-12 DIAGNOSIS — Z23 Encounter for immunization: Secondary | ICD-10-CM | POA: Diagnosis not present

## 2016-11-12 DIAGNOSIS — H548 Legal blindness, as defined in USA: Secondary | ICD-10-CM | POA: Diagnosis not present

## 2016-11-12 DIAGNOSIS — E1165 Type 2 diabetes mellitus with hyperglycemia: Secondary | ICD-10-CM | POA: Diagnosis present

## 2016-11-12 DIAGNOSIS — Z1159 Encounter for screening for other viral diseases: Secondary | ICD-10-CM

## 2016-11-12 DIAGNOSIS — D649 Anemia, unspecified: Secondary | ICD-10-CM | POA: Diagnosis not present

## 2016-11-12 DIAGNOSIS — I10 Essential (primary) hypertension: Secondary | ICD-10-CM

## 2016-11-12 DIAGNOSIS — IMO0001 Reserved for inherently not codable concepts without codable children: Secondary | ICD-10-CM

## 2016-11-12 DIAGNOSIS — IMO0002 Reserved for concepts with insufficient information to code with codable children: Secondary | ICD-10-CM | POA: Insufficient documentation

## 2016-11-12 LAB — CBC
HCT: 34.3 % — ABNORMAL LOW (ref 35.0–45.0)
Hemoglobin: 10.5 g/dL — ABNORMAL LOW (ref 11.7–15.5)
MCH: 27.3 pg (ref 27.0–33.0)
MCHC: 30.6 g/dL — ABNORMAL LOW (ref 32.0–36.0)
MCV: 89.1 fL (ref 80.0–100.0)
MPV: 10.4 fL (ref 7.5–12.5)
PLATELETS: 426 10*3/uL — AB (ref 140–400)
RBC: 3.85 MIL/uL (ref 3.80–5.10)
RDW: 14.9 % (ref 11.0–15.0)
WBC: 6.9 10*3/uL (ref 3.8–10.8)

## 2016-11-12 LAB — BASIC METABOLIC PANEL WITH GFR
BUN: 34 mg/dL — ABNORMAL HIGH (ref 7–25)
CHLORIDE: 108 mmol/L (ref 98–110)
CO2: 23 mmol/L (ref 20–31)
Calcium: 9.3 mg/dL (ref 8.6–10.4)
Creat: 2.19 mg/dL — ABNORMAL HIGH (ref 0.50–1.05)
GFR, EST AFRICAN AMERICAN: 29 mL/min — AB (ref >=60)
GFR, EST NON AFRICAN AMERICAN: 25 mL/min — AB (ref >=60)
Glucose, Bld: 111 mg/dL — ABNORMAL HIGH (ref 65–99)
POTASSIUM: 4.6 mmol/L (ref 3.5–5.3)
Sodium: 139 mmol/L (ref 135–146)

## 2016-11-12 LAB — POCT GLYCOSYLATED HEMOGLOBIN (HGB A1C): HEMOGLOBIN A1C: 5.8

## 2016-11-12 MED ORDER — METFORMIN HCL 500 MG PO TABS: 1000.0000 mg | ORAL_TABLET | Freq: Two times a day (BID) | ORAL | 3 refills | Status: DC

## 2016-11-12 MED ORDER — LISINOPRIL 5 MG PO TABS: 5.0000 mg | ORAL_TABLET | Freq: Every day | ORAL | 0 refills | Status: DC

## 2016-11-12 MED ORDER — LISINOPRIL 5 MG PO TABS: 5.0000 mg | ORAL_TABLET | Freq: Every day | ORAL | 2 refills | Status: DC

## 2016-11-12 NOTE — Assessment & Plan Note (Signed)
Patient presenting with subcutaneous mass/nodule that has progressively gotten larger.  - advised heat for now, will re-assess size at next appt. - may ultimately need U/S to further characterize. Given the depth, this does not seem like something I could remove myself in clinic if needed in the future.

## 2016-11-12 NOTE — Assessment & Plan Note (Signed)
Due to retinal detachment

## 2016-11-12 NOTE — Assessment & Plan Note (Addendum)
A1c 5.8 today, improved from previous. - will write Rx for metfomin 1000mg  BID - will hold off on glipizide for now given A1c - dicussed lifestyle modifications  - given flu, Tdap, and pneumococcal vaccine today - f/u in 1 month for foot exam and pap smear

## 2016-11-12 NOTE — Progress Notes (Signed)
Subjective: CC: cpe HPI: Patient is a 50 y.o. female with a past medical history of HTN and DM presenting to clinic today for a CPE.  The last time she was seen in our clinic was 12/20/12013.  Current concerns include:  Small knot on her back that was present over the last year that has progressively gotten bigger and more painful. She denies warmth to touch, no drainage.  No fevers/chills. Hasn't done anything for it. No h/o of anything like this before.  Diabetes:  CBGs at home: 90-100s.  Taking medications: takes metformin 1000mg  daily (states she gets it from her husband who gets it from the [place] and doesn't check his regularly).  Side effects: no ROS: denies fever, chills, dizziness, LOC, polyuria, polydipsia, numbness or tingling in extremities or chest pain. Last eye exam: patient is legally blind since 2015 per her report due to retinal detachment, sees [facility]  Last foot exam: never in our system Last A1c: 7.7 (10/2012).  Nephropathy screen indicated?: was on lisinopril in the past.  Last flu, zoster and/or pneumovax: due for all vaccines.  Hypertension Blood pressure at home: doesn't check Meds: hasn't taken lisinopril in > 1 year (was getting from [facility]) Side effects: no ROS: Denies headache, dizziness, visual changes, nausea, vomiting, chest pain, abdominal pain or shortness of breath.   Social History: never smoker   Flu Vaccine: no, needs  Tdap Vaccine: no, needs  Pneumonia Vaccine: no, needs PPSV-23   ROS: All other systems reviewed and are negative.  Past Medical History Patient Active Problem List   Diagnosis Date Noted  . Legally blind 11/12/2016  . Mass 11/12/2016  . Diabetes mellitus ([place] [facility]) 11/12/2012  . HTN (hypertension) 11/12/2012  . 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 Asthma Mother   . COPD Mother   . Hypertension Mother   . Stroke Mother      Medications-  reviewed and updated  Objective: Office vital signs reviewed. BP (!) 176/92   Pulse 78   Temp 98.9 F (37.2 C) (Oral)   Ht 5\' 4"  (1.626 m)   Wt 215 lb 12.8 oz (97.9 kg)   LMP 11/02/2016   BMI 37.04 kg/m    Physical Examination:  General: Awake, alert, well- nourished, NAD Eyes: Conjunctiva non-injected. PERRL.  Cardio: RRR, no m/r/g noted.  Pulm: No increased WOB.  CTAB, without wheezes, rhonchi or crackles noted.  GI: soft, NT/ND,+BS x4, no hepatomegaly, no splenomegaly Extremities: no pitting edema. MSK: Normal gait and station Skin: dry, intact, no rashes or lesions over exposed skin. Back: 3x1cm mildly mobile deep subcutaneous mass on the right lateral thoracic region, mildly tender to palpation.  Assessment/Plan: Legally blind Due to retinal detachment  HTN (hypertension) BP not controlled, however not currently on any medications. - baseline BMET today - will re-start lisinopril at 5mg  daily. - pt to f/u with lab appt in 2 weeks for repeat BMET - pt to f/u in 1 month with me  Diabetes mellitus ([place]) A1c 5.8 today, improved from previous. - will write Rx for metfomin 1000mg  BID - will hold off on glipizide for now given A1c - dicussed lifestyle modifications  - given flu, Tdap, and pneumococcal vaccine today - f/u in 1 month for foot exam and pap smear  Mass Patient presenting with subcutaneous mass/nodule that has progressively gotten larger.  - advised heat for now, will re-assess size at next appt. -  may ultimately need U/S to further characterize. Given the depth, this does not seem like something I could remove myself in clinic if needed in the future.   Patient to f/u in 1 month for pap smear.   Orders Placed This Encounter  Procedures  . Flu Vaccine QUAD 36+ mos IM  . Tdap vaccine greater than or equal to 7yo IM  . Pneumococcal polysaccharide vaccine 23-valent greater than or equal to 2yo subcutaneous/IM  . BASIC METABOLIC PANEL WITH GFR  .  Hepatitis C antibody  . CBC  . HIV antibody  . BASIC METABOLIC PANEL WITH GFR    Standing Status:   Future    Standing Expiration Date:   11/12/2017  . HgB A1c    Meds ordered this encounter  Medications  . DISCONTD: lisinopril (PRINIVIL,ZESTRIL) 5 MG tablet    Sig: Take 1 tablet (5 mg total) by mouth daily.    Dispense:  30 tablet    Refill:  0  . metFORMIN (GLUCOPHAGE) 500 MG tablet    Sig: Take 2 tablets (1,000 mg total) by mouth 2 (two) times daily with a meal.    Dispense:  120 tablet    Refill:  3  . lisinopril (PRINIVIL,ZESTRIL) 5 MG tablet    Sig: Take 1 tablet (5 mg total) by mouth daily.    Dispense:  30 tablet    Refill:  [place] PGY-3, [place]

## 2016-11-12 NOTE — Patient Instructions (Signed)
I have refilled her lisinopril at a low dose. I see refilled metformin, take 1000 mg twice daily. I'm checking baseline lab work today. Please return in 2 weeks for a lab appointment for a repeat check on your kidneys.  Please follow-up with me in one month for a Pap smear and a follow-up in your blood pressure.  For the knot on your back, start using a heating pad at least 3 times a day for 15-20 minutes.

## 2016-11-12 NOTE — Assessment & Plan Note (Addendum)
BP not controlled, however not currently on any medications. - baseline BMET today - will re-start lisinopril at 5mg  daily. - pt to f/u with lab appt in 2 weeks for repeat BMET - pt to f/u in 1 month with me

## 2016-11-13 LAB — HIV ANTIBODY (ROUTINE TESTING W REFLEX): HIV: NONREACTIVE

## 2016-11-13 LAB — HEPATITIS C ANTIBODY: HCV AB: NEGATIVE

## 2016-11-17 ENCOUNTER — Encounter: Payer: Self-pay | Admitting: Family Medicine

## 2016-11-26 ENCOUNTER — Other Ambulatory Visit: Payer: Medicaid Other

## 2016-11-26 DIAGNOSIS — I10 Essential (primary) hypertension: Secondary | ICD-10-CM

## 2016-11-26 LAB — BASIC METABOLIC PANEL WITH GFR
BUN: 28 mg/dL — AB (ref 7–25)
CHLORIDE: 110 mmol/L (ref 98–110)
CO2: 19 mmol/L — ABNORMAL LOW (ref 20–31)
CREATININE: 1.85 mg/dL — AB (ref 0.50–1.05)
Calcium: 8.8 mg/dL (ref 8.6–10.4)
GFR, EST AFRICAN AMERICAN: 36 mL/min — AB (ref >=60)
GFR, Est Non African American: 31 mL/min — ABNORMAL LOW (ref >=60)
Glucose, Bld: 103 mg/dL — ABNORMAL HIGH (ref 65–99)
POTASSIUM: 4.9 mmol/L (ref 3.5–5.3)
SODIUM: 140 mmol/L (ref 135–146)

## 2016-11-27 ENCOUNTER — Encounter: Payer: Self-pay | Admitting: Family Medicine

## 2016-12-10 ENCOUNTER — Ambulatory Visit (INDEPENDENT_AMBULATORY_CARE_PROVIDER_SITE_OTHER): Payer: Medicaid Other | Admitting: Family Medicine

## 2016-12-10 ENCOUNTER — Encounter: Payer: Self-pay | Admitting: Family Medicine

## 2016-12-10 DIAGNOSIS — I10 Essential (primary) hypertension: Secondary | ICD-10-CM

## 2016-12-10 MED ORDER — LISINOPRIL 5 MG PO TABS: 5.0000 mg | ORAL_TABLET | Freq: Every day | ORAL | 2 refills | Status: DC

## 2016-12-10 MED ORDER — TRAMADOL HCL 50 MG PO TABS: ORAL_TABLET | ORAL | 1 refills | Status: DC

## 2016-12-10 MED ORDER — AMLODIPINE BESYLATE 2.5 MG PO TABS: 2.5000 mg | ORAL_TABLET | Freq: Every day | ORAL | 3 refills | Status: DC

## 2016-12-10 MED ORDER — LISINOPRIL 5 MG PO TABS: ORAL_TABLET | ORAL | 2 refills | Status: DC

## 2016-12-10 NOTE — Patient Instructions (Signed)
Continue on lisinopril at 5 mg a day Continue to check your BP---if it is over 180 on top number take a second lisinoril I am adding another BP medicine---amlodipine--start that today I am giving you some tramadol for headache Nice to meet you!

## 2016-12-10 NOTE — Assessment & Plan Note (Signed)
Uncontrolled blood pressure by her home readings. In recent ophthalmology visit. I will add amlodipine 2.5 mg daily. Opted to do this instead of increasing her lisinopril to 10 mg a day as we are also watching her creatinine. I did give her the option of adding a second 5 mg dose if her evening blood pressure readings are still elevated. She has follow-up later in the week with her PCP. I will let her get some lab recheck of creatinine at that point.

## 2016-12-10 NOTE — Progress Notes (Signed)
    CHIEF COMPLAINT / HPI:   On day of her eye surgery they had to give her some IV medication for her elevated blood pressure which that day was 220/110. Blood pressure elevations last night per her home meter to systolic 015. She's been having headache. She saw her eye doctor yesterday said that the headaches do not seem to be related to her recent eye surgery. She's not had chest pain, no shortness of breath. Headache feels a little bit like the migraine she used to have when she was a teenager, start with frontal headache and become global. No visual phenomenon with these. No nausea or vomiting. Last night when her blood pressure was that high she took a second lisinopril tablet. The headache improved some but did not totally resolve.  REVIEW OF SYSTEMS:  See history of present illness.  OBJECTIVE:  Vital signs are reviewed.   GEN.: Well-developed overweight female no acute distress NECK: No JVD, no bruits over the carotid arteries. CV: Regular rate and rhythm  ASSESSMENT / PLAN: #1. Headache: Unclear if this was triggered by hypertension, triggered by recent surgery or other. Does have some symptoms similar to her migraines that she had as an adolescent but not clearly migrainous. I do think this is impacting her blood pressure. We'll treat her pretty aggressively with tramadol for the next few days to see if we can get the headache to resolve.   Additionally, Please see problem oriented charting for details of hypertension

## 2016-12-12 ENCOUNTER — Encounter: Payer: Self-pay | Admitting: Internal Medicine

## 2016-12-12 ENCOUNTER — Ambulatory Visit (INDEPENDENT_AMBULATORY_CARE_PROVIDER_SITE_OTHER): Payer: Medicaid Other | Admitting: Internal Medicine

## 2016-12-12 ENCOUNTER — Other Ambulatory Visit (HOSPITAL_COMMUNITY)
Admission: RE | Admit: 2016-12-12 | Discharge: 2016-12-12 | Disposition: A | Discharge status: Home / Self Care | Payer: Medicaid Other | Source: Ambulatory Visit | Attending: Family Medicine | Admitting: Family Medicine

## 2016-12-12 VITALS — BP 165/90 | HR 84 | Temp 97.6°F | Ht 64.0 in | Wt 215.0 lb

## 2016-12-12 DIAGNOSIS — Z01419 Encounter for gynecological examination (general) (routine) without abnormal findings: Secondary | ICD-10-CM | POA: Diagnosis present

## 2016-12-12 DIAGNOSIS — I10 Essential (primary) hypertension: Secondary | ICD-10-CM | POA: Diagnosis not present

## 2016-12-12 DIAGNOSIS — Z1151 Encounter for screening for human papillomavirus (HPV): Secondary | ICD-10-CM | POA: Diagnosis present

## 2016-12-12 MED ORDER — AMLODIPINE BESYLATE 5 MG PO TABS: 5.0000 mg | ORAL_TABLET | Freq: Every day | ORAL | 0 refills | Status: DC

## 2016-12-12 NOTE — Patient Instructions (Addendum)
Sarah Kline,  I am going to increase your Norvasc to 5 mg. Continue lisinopril, as well. You can take two 2.5 mg tablets of the norvasc, then pick up the 5 mg prescription when you run out.  Please follow-up in the next 1-2 weeks for blood pressure recheck. This can be a full appointment if you have any questions or a nurse visit if things are going well.   I will call you with your pap smear results.  Best, Dr. Ola Spurr  DASH Eating Plan DASH stands for "Dietary Approaches to Stop Hypertension." The DASH eating plan is a healthy eating plan that has been shown to reduce high blood pressure (hypertension). Additional health benefits may include reducing the risk of type 2 diabetes mellitus, heart disease, and stroke. The DASH eating plan may also help with weight loss. What do I need to know about the DASH eating plan? For the DASH eating plan, you will follow these general guidelines:  Choose foods with less than 150 milligrams of sodium per serving (as listed on the food label).  Use salt-free seasonings or herbs instead of table salt or sea salt.  Check with your health care provider or pharmacist before using salt substitutes.  Eat lower-sodium products. These are often labeled as "low-sodium" or "no salt added."  Eat fresh foods. Avoid eating a lot of canned foods.  Eat more vegetables, fruits, and low-fat dairy products.  Choose whole grains. Look for the word "whole" as the first word in the ingredient list.  Choose fish and skinless chicken or [place] more often than red meat. Limit fish, poultry, and meat to 6 oz (170 g) each day.  Limit sweets, desserts, sugars, and sugary drinks.  Choose heart-healthy fats.  Eat more home-cooked food and less restaurant, buffet, and fast food.  Limit fried foods.  Do not fry foods. Cook foods using methods such as baking, boiling, grilling, and broiling instead.  When eating at a restaurant, ask that your food be prepared with  less salt, or no salt if possible. What foods can I eat? Seek help from a dietitian for individual calorie needs. Grains  Whole grain or whole wheat bread. Brown rice. Whole grain or whole wheat pasta. Quinoa, bulgur, and whole grain cereals. Low-sodium cereals. Corn or whole wheat flour tortillas. Whole grain cornbread. Whole grain crackers. Low-sodium crackers. Vegetables  Fresh or frozen vegetables (raw, steamed, roasted, or grilled). Low-sodium or reduced-sodium tomato and vegetable juices. Low-sodium or reduced-sodium tomato sauce and paste. Low-sodium or reduced-sodium canned vegetables. Fruits  All fresh, canned (in natural juice), or frozen fruits. Meat and Other Protein Products  Ground beef (85% or leaner), grass-fed beef, or beef trimmed of fat. Skinless chicken or [place]. Ground chicken or [place]. Pork trimmed of fat. All fish and seafood. Eggs. Dried beans, peas, or lentils. Unsalted nuts and seeds. Unsalted canned beans. Dairy  Low-fat dairy products, such as skim or 1% milk, 2% or reduced-fat cheeses, low-fat ricotta or cottage cheese, or plain low-fat yogurt. Low-sodium or reduced-sodium cheeses. Fats and Oils  Tub margarines without trans fats. Light or reduced-fat mayonnaise and salad dressings (reduced sodium). Avocado. Safflower, olive, or canola oils. Natural peanut or almond butter. Other  Unsalted popcorn and pretzels. The items listed above may not be a complete list of recommended foods or beverages. Contact your dietitian for more options.  What foods are not recommended? Grains  White bread. White pasta. White rice. Refined cornbread. Bagels and croissants. Crackers that contain trans fat. Vegetables  Creamed or fried vegetables. Vegetables in a cheese sauce. Regular canned vegetables. Regular canned tomato sauce and paste. Regular tomato and vegetable juices. Fruits  Canned fruit in light or heavy syrup. Fruit juice. Meat and Other Protein Products  Fatty cuts of  meat. Ribs, chicken wings, bacon, sausage, bologna, salami, chitterlings, fatback, hot dogs, bratwurst, and packaged luncheon meats. Salted nuts and seeds. Canned beans with salt. Dairy  Whole or 2% milk, cream, half-and-half, and cream cheese. Whole-fat or sweetened yogurt. Full-fat cheeses or blue cheese. Nondairy creamers and whipped toppings. Processed cheese, cheese spreads, or cheese curds. Condiments  Onion and garlic salt, seasoned salt, table salt, and sea salt. Canned and packaged gravies. Worcestershire sauce. Tartar sauce. Barbecue sauce. Teriyaki sauce. Soy sauce, including reduced sodium. Steak sauce. Fish sauce. Oyster sauce. Cocktail sauce. Horseradish. Ketchup and mustard. Meat flavorings and tenderizers. Bouillon cubes. Hot sauce. Tabasco sauce. Marinades. Taco seasonings. Relishes. Fats and Oils  Butter, stick margarine, lard, shortening, ghee, and bacon fat. Coconut, palm kernel, or palm oils. Regular salad dressings. Other  Pickles and olives. Salted popcorn and pretzels. The items listed above may not be a complete list of foods and beverages to avoid. Contact your dietitian for more information.  Where can I find more information? National Heart, Lung, and Blood Institute: travelstabloid.com This information is not intended to replace advice given to you by your health care provider. Make sure you discuss any questions you have with your health care provider. Document Released: 11/06/2011 Document Revised: 04/24/2016 Document Reviewed: 09/21/2013 Elsevier Interactive Patient Education  2017 [employer].

## 2016-12-12 NOTE — Progress Notes (Signed)
[facility] Progress Note  Subjective:  Sarah Kline is a 51 y.o. legally blind female who presents for pap smear and to follow-up high blood pressure.  Elevated BP: - Was seen at [facility] for this 1/10 and was started on amlodipine 2.5 mg in addition to her regular 5 mg lisinopril; has not missed any doses  - BP to 220/110 when had retinal detachment repair surgery of L eye 10/8 and had to have IV BP medication - BP readings at home have been elevated per patient, though she could not give specific readings - Headache has improved - Does not add salt to her food and only canned foods are green beans, which she washes off before eating - Had been off medication for years while not receiving medical care until 10/2016 ROS: No LE swelling, no improvement in vision since eye surgery  Need for pap smear: - Does not desire STD testing at same time - Denies increased vaginal discharge - Still having periods  Past Medical History:  Diagnosis Date  . Diabetes mellitus   . Sickle cell disease, type S ([place])    Social: Never smoker. Lives with husband.   Objective: Blood pressure (!) 165/90, pulse 84, temperature 97.6 F (36.4 C), temperature source Oral, height 5\' 4"  (1.626 m), weight 215 lb (97.5 kg), last menstrual period 11/02/2016, SpO2 95 %. Body mass index is 36.9 kg/m. Constitutional: Obese female, in NAD HENT: Scleral erythema of L eye Cardiovascular: RRR, S1, S2, no m/r/g.  Pulmonary/Chest: Effort normal and breath sounds normal. No respiratory distress.   Musculoskeletal: No LE edema Skin: Skin is warm and dry. No rash noted.  GU: No lesions or increased discharge noted on speculum exam. No cervical motion tenderness on bimanual exam.  Vitals reviewed  Assessment/Plan: HTN (hypertension) - Not at BP goal of < 140/90. Will increase amlodipine to 5 mg daily. Take 2 of the 2.5 mg tablets daily. Provided refills of 5 mg amlodipine. - Asked patient to keep log  of BP readings at home. - Return in 1-2 weeks for BP recheck. Anticipate needing to make further increases.   Pap smear performed. Will call patient with results.   Follow-up in 1-2 weeks for blood pressure recheck.  Olene Floss, MD [place], PGY-2

## 2016-12-13 NOTE — Assessment & Plan Note (Signed)
-   Not at BP goal of < 140/90. Will increase amlodipine to 5 mg daily. Take 2 of the 2.5 mg tablets daily. Provided refills of 5 mg amlodipine. - Asked patient to keep log of BP readings at home. - Return in 1-2 weeks for BP recheck. Anticipate needing to make further increases.

## 2016-12-16 LAB — CYTOLOGY - PAP
Diagnosis: NEGATIVE
HPV (WINDOPATH): NOT DETECTED

## 2016-12-23 ENCOUNTER — Ambulatory Visit (INDEPENDENT_AMBULATORY_CARE_PROVIDER_SITE_OTHER): Payer: Medicaid Other | Admitting: *Deleted

## 2016-12-23 VITALS — BP 142/72 | HR 75

## 2016-12-23 DIAGNOSIS — I10 Essential (primary) hypertension: Secondary | ICD-10-CM

## 2016-12-23 DIAGNOSIS — Z0130 Encounter for examination of blood pressure without abnormal findings: Secondary | ICD-10-CM

## 2016-12-23 NOTE — Progress Notes (Signed)
     Patient in nurse clinic for blood pressure check.  Patient was a little confused on what medications to take. Reviewed blood pressure medication when to take and how many to take a day.  Patient denies chest pain, SOB, dizziness, headache, visual changes.  Will forward to PCP.  Derl Barrow, RN   Today's Vitals   12/23/16 1530 12/23/16 1541  BP: (!) 160/82 (!) 142/72  Pulse: 75   SpO2: 98%   PainSc: 0-No pain

## 2017-01-19 ENCOUNTER — Encounter (HOSPITAL_COMMUNITY): Payer: Self-pay | Admitting: Emergency Medicine

## 2017-01-19 ENCOUNTER — Emergency Department (HOSPITAL_COMMUNITY): Payer: Medicaid Other

## 2017-01-19 ENCOUNTER — Observation Stay (HOSPITAL_COMMUNITY)
Admission: EM | Admit: 2017-01-19 | Discharge: 2017-01-20 | Disposition: A | Discharge status: Home / Self Care | Payer: Medicaid Other | Attending: Family Medicine | Admitting: Family Medicine

## 2017-01-19 DIAGNOSIS — H4920 Sixth [abducent] nerve palsy, unspecified eye: Secondary | ICD-10-CM | POA: Diagnosis present

## 2017-01-19 DIAGNOSIS — Z8249 Family history of ischemic heart disease and other diseases of the circulatory system: Secondary | ICD-10-CM | POA: Insufficient documentation

## 2017-01-19 DIAGNOSIS — Z882 Allergy status to sulfonamides status: Secondary | ICD-10-CM | POA: Insufficient documentation

## 2017-01-19 DIAGNOSIS — S0502XA Injury of conjunctiva and corneal abrasion without foreign body, left eye, initial encounter: Secondary | ICD-10-CM

## 2017-01-19 DIAGNOSIS — Z8673 Personal history of transient ischemic attack (TIA), and cerebral infarction without residual deficits: Secondary | ICD-10-CM | POA: Diagnosis not present

## 2017-01-19 DIAGNOSIS — Z823 Family history of stroke: Secondary | ICD-10-CM | POA: Diagnosis not present

## 2017-01-19 DIAGNOSIS — R32 Unspecified urinary incontinence: Secondary | ICD-10-CM | POA: Diagnosis not present

## 2017-01-19 DIAGNOSIS — Z7984 Long term (current) use of oral hypoglycemic drugs: Secondary | ICD-10-CM | POA: Diagnosis not present

## 2017-01-19 DIAGNOSIS — K5900 Constipation, unspecified: Secondary | ICD-10-CM | POA: Insufficient documentation

## 2017-01-19 DIAGNOSIS — I10 Essential (primary) hypertension: Secondary | ICD-10-CM | POA: Diagnosis not present

## 2017-01-19 DIAGNOSIS — Z825 Family history of asthma and other chronic lower respiratory diseases: Secondary | ICD-10-CM | POA: Insufficient documentation

## 2017-01-19 DIAGNOSIS — X58XXXA Exposure to other specified factors, initial encounter: Secondary | ICD-10-CM | POA: Diagnosis not present

## 2017-01-19 DIAGNOSIS — G459 Transient cerebral ischemic attack, unspecified: Principal | ICD-10-CM | POA: Diagnosis present

## 2017-01-19 DIAGNOSIS — H4020X0 Unspecified primary angle-closure glaucoma, stage unspecified: Secondary | ICD-10-CM | POA: Insufficient documentation

## 2017-01-19 DIAGNOSIS — H548 Legal blindness, as defined in USA: Secondary | ICD-10-CM | POA: Diagnosis not present

## 2017-01-19 DIAGNOSIS — Z79899 Other long term (current) drug therapy: Secondary | ICD-10-CM | POA: Diagnosis not present

## 2017-01-19 DIAGNOSIS — E119 Type 2 diabetes mellitus without complications: Secondary | ICD-10-CM | POA: Insufficient documentation

## 2017-01-19 DIAGNOSIS — D571 Sickle-cell disease without crisis: Secondary | ICD-10-CM | POA: Diagnosis not present

## 2017-01-19 DIAGNOSIS — E1165 Type 2 diabetes mellitus with hyperglycemia: Secondary | ICD-10-CM

## 2017-01-19 DIAGNOSIS — Z9889 Other specified postprocedural states: Secondary | ICD-10-CM | POA: Insufficient documentation

## 2017-01-19 DIAGNOSIS — H5712 Ocular pain, left eye: Secondary | ICD-10-CM

## 2017-01-19 HISTORY — DX: Essential (primary) hypertension: I10

## 2017-01-19 LAB — CBC WITH DIFFERENTIAL/PLATELET
BASOS ABS: 0.1 10*3/uL (ref 0.0–0.1)
BASOS PCT: 1 %
EOS ABS: 0.1 10*3/uL (ref 0.0–0.7)
EOS PCT: 2 %
HCT: 32.2 % — ABNORMAL LOW (ref 36.0–46.0)
Hemoglobin: 10.7 g/dL — ABNORMAL LOW (ref 12.0–15.0)
LYMPHS ABS: 1.6 10*3/uL (ref 0.7–4.0)
LYMPHS PCT: 21 %
MCH: 28 pg (ref 26.0–34.0)
MCHC: 33.2 g/dL (ref 30.0–36.0)
MCV: 84.3 fL (ref 78.0–100.0)
Monocytes Absolute: 0.3 10*3/uL (ref 0.1–1.0)
Monocytes Relative: 4 %
Neutro Abs: 5.5 10*3/uL (ref 1.7–7.7)
Neutrophils Relative %: 72 %
Platelets: 341 10*3/uL (ref 150–400)
RBC: 3.82 MIL/uL — ABNORMAL LOW (ref 3.87–5.11)
RDW: 14.2 % (ref 11.5–15.5)
WBC: 7.6 10*3/uL (ref 4.0–10.5)

## 2017-01-19 LAB — COMPREHENSIVE METABOLIC PANEL
ALBUMIN: 3.7 g/dL (ref 3.5–5.0)
ALT: 14 U/L (ref 14–54)
ANION GAP: 10 (ref 5–15)
AST: 16 U/L (ref 15–41)
Alkaline Phosphatase: 86 U/L (ref 38–126)
BUN: 23 mg/dL — AB (ref 6–20)
CHLORIDE: 109 mmol/L (ref 101–111)
CO2: 21 mmol/L — AB (ref 22–32)
Calcium: 9.3 mg/dL (ref 8.9–10.3)
Creatinine, Ser: 1.87 mg/dL — ABNORMAL HIGH (ref 0.44–1.00)
GFR calc Af Amer: 35 mL/min — ABNORMAL LOW (ref >60)
GFR calc non Af Amer: 30 mL/min — ABNORMAL LOW (ref >60)
Glucose, Bld: 112 mg/dL — ABNORMAL HIGH (ref 65–99)
POTASSIUM: 4.2 mmol/L (ref 3.5–5.1)
SODIUM: 140 mmol/L (ref 135–145)
Total Bilirubin: 1.1 mg/dL (ref 0.3–1.2)
Total Protein: 7.1 g/dL (ref 6.5–8.1)

## 2017-01-19 MED ORDER — FLUORESCEIN SODIUM 0.6 MG OP STRP
1.0000 | ORAL_STRIP | Freq: Once | OPHTHALMIC | Status: AC
  Administered 2017-01-19: 1 via OPHTHALMIC
  Filled 2017-01-19: qty 1

## 2017-01-19 MED ORDER — ERYTHROMYCIN 5 MG/GM OP OINT
TOPICAL_OINTMENT | Freq: Four times a day (QID) | OPHTHALMIC | Status: DC
  Administered 2017-01-20: 1 via OPHTHALMIC
  Administered 2017-01-20 (×3): via OPHTHALMIC
  Filled 2017-01-19: qty 3.5

## 2017-01-19 MED ORDER — TETRACAINE HCL 0.5 % OP SOLN
2.0000 [drp] | Freq: Once | OPHTHALMIC | Status: AC
  Administered 2017-01-19: 2 [drp] via OPHTHALMIC
  Filled 2017-01-19: qty 2

## 2017-01-19 NOTE — ED Triage Notes (Addendum)
Pt to ED with c/o HTN.  St's she check it at home because she just didn't feel well.  Pt st's she has not missed any of her HTN meds.  Pt also c/o redness in left eye (had eye surgery 6 weeks ago) Pt also c/o pain to this eye

## 2017-01-19 NOTE — H&P (Signed)
Family Medicine Teaching [facility] Admission History and Physical Service Pager: [phone]  Patient name: Sarah Kline Medical record number: [id number] Date of birth: [birth year] Age: 51 y.o. Gender: female  Primary Care Provider: Rodrigo Ran, MD Consultants: none Code Status: full  Chief Complaint: high blood pressure, episode of weakness  Assessment and Plan: Sarah Kline is a 51 y.o. female presenting with an episode of weakness and high blood pressures. PMH is significant for DM, HTN, Blindness.   Hx of transient left arm weakness, left facial droop, and slurred speech- resolved- Concern for TIA. Patient has several risk factors including uncontrolled HTN and T2DM. Not on a statin or daily aspirin. Has had one other episode of hand weakness in December that also resolved. Initial head CT negative. BP Elevated up to 204/100 in ED.  182/89 on our exam.  Will admit for TIA work up and accelerated HTN.  -place in observation, attending Dr. Gwendolyn Grant -monitor on telemetry -vitals per floor protocol -up with assistance  -risk stratification labs, A1C, TSH, lipid panel -carotid US -echocardiogram -MRI/ MRA head ordered, pending -initiate daily aspirin -am CBC, BMP -PT/OT consult -bedside SLP eval.  Ok to order HH/Carb mod diet.  Accelerated HTN- Has been 190-200's/90's at home. 183/93 in ED. Has been taking 10 mg lisinopril daily, has run out of Norvasc in beginning of February.  Took Lisinopril this am.  -permissive HTN for now for next 24 hrs. -Hydralazine IV 5mg  q4h PRN SBP>220, DBP>110 -resume home BP meds in morning, titrate up as needed  Left corneal abrasion- wood's lamp test demonstrated corneal abrasion. had recent retinal reattachment surgery January 8th. Eye has been irritated since but in past week has become more red and painful. Spoke with [facility] opthalmology on call, Dr Vernona Rieger. With patient's permission, photographs taken and sent to opthalmologist.   Ocular pressure at follow up on 1/16 was 12.  Pressure normal in ED (15, 16, 11).  Closed angle glaucoma unlikely to be cause of ocular pain at this time.  No evidence of endophthalmitis.  Discussed findings with Dr Vernona Rieger, who agreed with lubricating abx ointment.  No need for pseudomonal coverage at this time.  [name].  Inpatient team encouraged to call if any needs arise. -erythromycin ointment QID, can increase to 6x daily if needed for comfort -continue to monitor  T2DM- last A1C December 2017 5.8. Takes metformin 1000 mg BID.  Last dose 2/18 pm -hold home metformin -recheck A1C -CBGs ACHS -sensitive SSI   Elevated Creatinine- on admission Cr 1.87, similar value in December 2017. Previously elevated to 2.12-2.19. Patient without diagnosis of CKD.   -continue to monitor -avoid nephrotoxic agents -consider outpatient workup/ renal ultrasound  Urinary incontinence: Per patient, she was unaware that she wet herself.  No back pain. No saddle anaesthesia. No fecal incontinence.  At this time, I doubt cauda equina. -obtain bladder scan/ PVR to evaluate for possible overflow incontinence  FEN/GI: NPO pending swallow study, then heart healthy carb modified diet Prophylaxis: SCD's  Disposition: pending clinical improvement.  Anticipate dc in next 24-48 hours.  History of Present Illness:  Sarah Kline is a 52 y.o. female presenting with accelerated HTN, left eye pain and resolved neurologic symptoms.  She notes that on Saturday, [name].  She reports left sided facial numbness/ tingling.  She notes that symptoms lasted about 2 minutes.  She reports that last evening she noted her left eye was irritating  her a little more.  She reports that she felt generalized weakness last night.  She reports she came to the ED because her BPs were elevated at home (to 200s systolic).  She reports compliance with Norvasc and am  Lisinopril.  She reports that she ran out of Norvasc about 2 weeks ago because she was doubling up on Norvasc for a while instead of Lisinopril.  She lately has been taking Lisinopril 10mg  now.  Last dose this morning.  Last dose of Metformin was last night.  She denies CP, SOB, headaches.  She has had Left ocular pain in the past such that it has caused headaches and made her nauseous.  She reports she is experiencing pain in that eye today.  She had surgery to attempt to reattach the retina 12/08/16.  She denies nausea, pain elsewhere.  She reports constipation at baseline.  She is legally blind at baseline.  Review Of Systems: Per HPI with the following additions:   Review of Systems  Constitutional: Negative for chills and fever.  Eyes: Positive for pain and redness.  Respiratory: Negative for cough, shortness of breath and wheezing.   Cardiovascular: Negative for chest pain, palpitations and leg swelling.  Gastrointestinal: Positive for constipation. Negative for abdominal pain, blood in stool, diarrhea, nausea and vomiting.  Genitourinary: Negative for dysuria.  Musculoskeletal: Negative for falls and myalgias.  Neurological: Positive for sensory change, speech change, focal weakness and headaches.  Psychiatric/Behavioral: Negative for substance abuse.    Patient Active Problem List   Diagnosis Date Noted  . TIA (transient ischemic attack) 01/19/2017  . Accelerated hypertension 01/19/2017  . Left eye pain 01/19/2017  . Legally blind 11/12/2016  . Mass 11/12/2016  . Diabetes mellitus (HCC) 11/12/2012  . HTN (hypertension) 11/12/2012  . 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 Diabetes mellitus   . Hypertension   . Sickle cell disease, type [facility])     Past Surgical History: Past Surgical History:  Procedure Laterality Date  . EYE SURGERY    . GANGLION CYST EXCISION     Left dorsal wrist    Social History: Social  History  Substance Use Topics  . Smoking status: Never Smoker  . Smokeless tobacco: Never Used  . Alcohol use 1.2 oz/week    2 Shots of liquor per week     Comment: twice per month   Additional social history: resides with husband , daughter Please also refer to relevant sections of EMR.  Family History: Family History  Problem Relation Age of Onset  . Asthma Mother   . COPD Mother   . Hypertension Mother   . Stroke Mother     Allergies and Medications: Allergies  Allergen Reactions  . Bactrim [Sulfamethoxazole-Trimethoprim] Rash   No current facility-administered medications on file prior to encounter.    Current Outpatient Prescriptions on File Prior to Encounter  Medication Sig Dispense Refill  . lisinopril (PRINIVIL,ZESTRIL) 5 MG tablet Take on by mouth in morning and take a second tab in evening if systolic BP is above 180 (Patient taking differently: Take 5 mg by mouth daily. And may take another 5 mg in the evening if Systolic B/P is 180 or greater) 60 tablet 2  . metFORMIN (GLUCOPHAGE) 500 MG tablet Take 2 tablets (1,000 mg total) by mouth 2 (two) times daily with a meal. (Patient taking differently: Take 500 mg by mouth 2 (two) times daily. ) 120 tablet 3  .  traMADol (ULTRAM) 50 MG tablet Take or or two tabs by mouth  at beginning of headache and may repeat every 8 hours max 6 tabs a day (Patient taking differently: Take 50-100 mg by mouth See admin instructions. At the beginning of a headache and may repeat every 8 hours if needed (max 6 tabs a day)) 45 tablet 1  . amLODipine (NORVASC) 5 MG tablet Take 1 tablet (5 mg total) by mouth daily. (Patient not taking: Reported on 01/19/2017) 90 tablet 0    Objective: BP 182/89   Pulse 80   Temp 98.2 F (36.8 C)   Resp 18   Ht 5\' 4"  (1.626 m)   Wt 215 lb (97.5 kg)   LMP 01/01/2017 (Exact Date)   SpO2 95%   BMI 36.90 kg/m  Exam: General: pleasant lady sitting up in bed in NAD, family at bedside Eyes: left eye with  conjunctival injection without edema. EOMI. PERRL. No increased pain with extraocular movement.  No hypopyon.  Under wood's lamp evaluation, a corneal abrasion appreciated around the 6 o'clock position ENTM: moist mucous membranes no erythema or discharge noted in oropharynx, symmetric rise of palate  Neck: supple, non-tender, no lymphadenopathy  Cardiovascular: RRR. 3/6 systolic murmur. Respiratory: CTAB no increased work of breathing Gastrointestinal: +BS. Soft, non-tender, non-distended MSK: no edema or cyanosis Derm: warm , dry, no rashes or lesions noted Neuro: CN2-12 grossly in tact with the exception of vision, UE/ LE strength 5/5 bilaterally, light touch sensation grossly in tact throughout. Heel to shin testing normal bilaterally. Speech normal.  Follows commands. Patellar DTRs 1/4 bilaterally. Psych: pleasant, appropriate mood and affect  Labs and Imaging: CBC BMET   Recent Labs Lab 01/19/17 1646  WBC 7.6  HGB 10.7*  HCT 32.2*  PLT 341    Recent Labs Lab 01/19/17 1646  NA 140  K 4.2  CL 109  CO2 21*  BUN 23*  CREATININE 1.87*  GLUCOSE 112*  CALCIUM 9.3     Ct Head Wo Contrast  Result Date: 01/19/2017 CLINICAL DATA:  Left arm, leg and face weakness and tingling since 01/17/2017. EXAM: CT HEAD WITHOUT CONTRAST TECHNIQUE: Contiguous axial images were obtained from the base of the skull through the vertex without intravenous contrast. COMPARISON:  Head CT scan 07/31/2012. FINDINGS: Brain: Prominence of the ventricular system is stable in appearance. No evidence of hemorrhage, infarct, mass lesion, midline shift or abnormal extra-axial fluid collection is identified. Vascular: Negative. Skull: Intact. Sinuses/Orbits: Prosthetic left globe is noted. Other: None. 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 No acute intracranial abnormality. Electronically Signed   By: Drusilla Kanner M.D.   On: 01/19/2017  20:41     Tillman Sers, DO 01/19/2017, 11:39 PM PGY-1, [place] [facility] FPTS Intern pager: [phone], text pages welcome  I have separately seen and examined the patient. I have discussed the findings and exam with Dr Wonda Olds and agree with the above note.  My changes/additions are outlined in BLUE.   Jovanie Verge M. Nadine Counts, DO PGY-3, [facility] Residency

## 2017-01-19 NOTE — ED Notes (Signed)
PT states on Saturday you have numbness to left arm from elbow down, slurred speech, and left facial numbness and last 2 minutes then resolved.  All symptoms have resolved

## 2017-01-19 NOTE — ED Notes (Signed)
EDP at bedside doing eye exam, checking pressure, and cornea.

## 2017-01-20 ENCOUNTER — Observation Stay (HOSPITAL_COMMUNITY): Payer: Medicaid Other

## 2017-01-20 ENCOUNTER — Observation Stay (HOSPITAL_BASED_OUTPATIENT_CLINIC_OR_DEPARTMENT_OTHER): Payer: Medicaid Other

## 2017-01-20 DIAGNOSIS — G459 Transient cerebral ischemic attack, unspecified: Secondary | ICD-10-CM

## 2017-01-20 DIAGNOSIS — E1122 Type 2 diabetes mellitus with diabetic chronic kidney disease: Secondary | ICD-10-CM | POA: Diagnosis not present

## 2017-01-20 DIAGNOSIS — H548 Legal blindness, as defined in USA: Secondary | ICD-10-CM

## 2017-01-20 DIAGNOSIS — I10 Essential (primary) hypertension: Secondary | ICD-10-CM | POA: Diagnosis not present

## 2017-01-20 DIAGNOSIS — H5712 Ocular pain, left eye: Secondary | ICD-10-CM

## 2017-01-20 DIAGNOSIS — R32 Unspecified urinary incontinence: Secondary | ICD-10-CM

## 2017-01-20 DIAGNOSIS — H4920 Sixth [abducent] nerve palsy, unspecified eye: Secondary | ICD-10-CM | POA: Diagnosis not present

## 2017-01-20 DIAGNOSIS — S0502XA Injury of conjunctiva and corneal abrasion without foreign body, left eye, initial encounter: Secondary | ICD-10-CM

## 2017-01-20 LAB — GLUCOSE, CAPILLARY
GLUCOSE-CAPILLARY: 129 mg/dL — AB (ref 65–99)
GLUCOSE-CAPILLARY: 87 mg/dL (ref 65–99)

## 2017-01-20 LAB — ECHOCARDIOGRAM COMPLETE
Height: 64 in
Weight: 3440 oz

## 2017-01-20 LAB — LIPID PANEL
CHOL/HDL RATIO: 4.5 ratio
Cholesterol: 181 mg/dL (ref 0–200)
HDL: 40 mg/dL — AB (ref >40)
LDL CALC: 130 mg/dL — AB (ref 0–99)
TRIGLYCERIDES: 57 mg/dL (ref <150)
VLDL: 11 mg/dL (ref 0–40)

## 2017-01-20 LAB — CBC
HCT: 28.4 % — ABNORMAL LOW (ref 36.0–46.0)
HEMOGLOBIN: 9.5 g/dL — AB (ref 12.0–15.0)
MCH: 28.2 pg (ref 26.0–34.0)
MCHC: 33.5 g/dL (ref 30.0–36.0)
MCV: 84.3 fL (ref 78.0–100.0)
PLATELETS: 320 10*3/uL (ref 150–400)
RBC: 3.37 MIL/uL — AB (ref 3.87–5.11)
RDW: 14 % (ref 11.5–15.5)
WBC: 7.5 10*3/uL (ref 4.0–10.5)

## 2017-01-20 LAB — BASIC METABOLIC PANEL
Anion gap: 8 (ref 5–15)
BUN: 25 mg/dL — AB (ref 6–20)
CHLORIDE: 109 mmol/L (ref 101–111)
CO2: 20 mmol/L — ABNORMAL LOW (ref 22–32)
Calcium: 8.8 mg/dL — ABNORMAL LOW (ref 8.9–10.3)
Creatinine, Ser: 1.79 mg/dL — ABNORMAL HIGH (ref 0.44–1.00)
GFR calc non Af Amer: 31 mL/min — ABNORMAL LOW (ref >60)
GFR, EST AFRICAN AMERICAN: 36 mL/min — AB (ref >60)
Glucose, Bld: 123 mg/dL — ABNORMAL HIGH (ref 65–99)
POTASSIUM: 4.1 mmol/L (ref 3.5–5.1)
SODIUM: 137 mmol/L (ref 135–145)

## 2017-01-20 LAB — TSH: TSH: 0.553 u[IU]/mL (ref 0.350–4.500)

## 2017-01-20 MED ORDER — ATORVASTATIN CALCIUM 80 MG PO TABS
80.0000 mg | ORAL_TABLET | Freq: Every day | ORAL | Status: DC
  Administered 2017-01-20: 80 mg via ORAL
  Filled 2017-01-20: qty 1

## 2017-01-20 MED ORDER — ERYTHROMYCIN 5 MG/GM OP OINT: TOPICAL_OINTMENT | Freq: Four times a day (QID) | OPHTHALMIC | 0 refills | Status: DC

## 2017-01-20 MED ORDER — TRAMADOL HCL 50 MG PO TABS
50.0000 mg | ORAL_TABLET | Freq: Three times a day (TID) | ORAL | Status: DC | PRN
Start: 2017-01-20 — End: 2017-01-21
  Administered 2017-01-20: 50 mg via ORAL
  Filled 2017-01-20: qty 1

## 2017-01-20 MED ORDER — ASPIRIN 325 MG PO TABS: 325.0000 mg | ORAL_TABLET | Freq: Every day | ORAL | 0 refills | Status: DC

## 2017-01-20 MED ORDER — LISINOPRIL 5 MG PO TABS
5.0000 mg | ORAL_TABLET | Freq: Every day | ORAL | Status: DC
  Administered 2017-01-20: 5 mg via ORAL
  Filled 2017-01-20: qty 1

## 2017-01-20 MED ORDER — ASPIRIN 300 MG RE SUPP
300.0000 mg | Freq: Every day | RECTAL | Status: DC
  Filled 2017-01-20: qty 1

## 2017-01-20 MED ORDER — STROKE: EARLY STAGES OF RECOVERY BOOK
Freq: Once | Status: AC
  Administered 2017-01-20: 03:00:00
  Filled 2017-01-20 (×2): qty 1

## 2017-01-20 MED ORDER — AMLODIPINE BESYLATE 5 MG PO TABS
5.0000 mg | ORAL_TABLET | Freq: Every day | ORAL | Status: DC
  Administered 2017-01-20: 5 mg via ORAL
  Filled 2017-01-20: qty 1

## 2017-01-20 MED ORDER — ACETAMINOPHEN 160 MG/5ML PO SOLN: 650.0000 mg | ORAL | Status: DC | PRN

## 2017-01-20 MED ORDER — ACETAMINOPHEN 650 MG RE SUPP: 650.0000 mg | RECTAL | Status: DC | PRN

## 2017-01-20 MED ORDER — INSULIN ASPART 100 UNIT/ML ~~LOC~~ SOLN
0.0000 [IU] | Freq: Three times a day (TID) | SUBCUTANEOUS | Status: DC
  Administered 2017-01-20 (×2): 1 [IU] via SUBCUTANEOUS

## 2017-01-20 MED ORDER — LISINOPRIL 5 MG PO TABS: 5.0000 mg | ORAL_TABLET | Freq: Every day | ORAL | Status: DC

## 2017-01-20 MED ORDER — SENNOSIDES-DOCUSATE SODIUM 8.6-50 MG PO TABS: 1.0000 | ORAL_TABLET | Freq: Every evening | ORAL | Status: DC | PRN

## 2017-01-20 MED ORDER — ATORVASTATIN CALCIUM 80 MG PO TABS: 80.0000 mg | ORAL_TABLET | Freq: Every day | ORAL | 0 refills | Status: DC

## 2017-01-20 MED ORDER — ACETAMINOPHEN 325 MG PO TABS: 650.0000 mg | ORAL_TABLET | ORAL | Status: DC | PRN

## 2017-01-20 MED ORDER — ASPIRIN 325 MG PO TABS
325.0000 mg | ORAL_TABLET | Freq: Every day | ORAL | Status: DC
  Administered 2017-01-20: 325 mg via ORAL
  Filled 2017-01-20: qty 1

## 2017-01-20 MED ORDER — HYDRALAZINE HCL 20 MG/ML IJ SOLN: 5.0000 mg | INTRAMUSCULAR | Status: DC | PRN

## 2017-01-20 MED ORDER — AMLODIPINE BESYLATE 5 MG PO TABS: 5.0000 mg | ORAL_TABLET | Freq: Every day | ORAL | Status: DC

## 2017-01-20 NOTE — Progress Notes (Addendum)
Transitions of Care Pharmacy Note  Plan:  Educated on new ASA, atorvastatin; PTA amlodipine, lisinopril Addressed concerns regarding statin side effects, hypertension  Per MD note, permissive HTN for next 24 hours, but recommend increasing amlodipine and/or lisinopril before discharge. Counseled pt to monitor BP at home and always contact MD for refills before medication runs out. --------------------------------------------- Sarah Kline is an 51 y.o. female who presents with TIA. In anticipation of discharge, pharmacy has reviewed this patient's prior to admission medication history, as well as current inpatient medications listed per the [facility].  Current medication indications, dosing, frequency, and notable side effects reviewed with patient and caregiver. patient and caregiver verbalized understanding of current inpatient medication regimen and are aware that the After Visit Summary when presented, will represent the most accurate medication list at discharge.   Sarah Kline expressed concerns regarding statin side effects and her blood pressure being consistently high.  Assessment: Understanding of regimen: good Understanding of indications: good Potential of compliance: good Barriers to Obtaining Medications: Yes - pt ran out of amlodipine prior to admission  Patient instructed to contact inpatient pharmacy team with further questions or concerns if needed.    Time spent preparing for discharge counseling: 15 mins Time spent counseling patient: 15 mins   Thank you for allowing pharmacy to be a part of this patient's care.  Gwenlyn Perking, PharmD PGY1 Pharmacy Resident Pager: [phone] 01/20/2017 5:42 PM

## 2017-01-20 NOTE — Progress Notes (Signed)
Patient arrived to unit via ED staff with daughter and belongings at bedside.  Vitals stable, questions answered, admission completed. Tele applied and verified. No complaints at this time. Continue to monitor patient.

## 2017-01-20 NOTE — Care Management Note (Signed)
Case Management Note  Patient Details  Name: Sarah Kline MRN: [id number] Date of Birth: 1966/09/13  Subjective/Objective:    Pt in with TIA. She is from home with spouse. Pt is legally blind at baseline.                 Action/Plan: No f/u per PT/OT but cane recommended. CM following for d/c needs, MD orders.  Expected Discharge Date:                  Expected Discharge Plan:  Home/Self Care  In-House Referral:     Discharge planning Services     Post Acute Care Choice:    Choice offered to:     DME Arranged:    DME Agency:     HH Arranged:    HH Agency:     Status of Service:  In process, will continue to follow  If discussed at Long Length of Stay Meetings, dates discussed:    Additional Comments:  Pollie Friar, RN 01/20/2017, 4:16 PM

## 2017-01-20 NOTE — Progress Notes (Signed)
OT Cancellation Note  Patient Details Name: Sarah Kline MRN: [id number] DOB: Sep 22, 1966   Cancelled Treatment:    Reason Eval/Treat Not Completed: Patient at procedure or test/ unavailable. Pt off unit at [facility] at this time. Will check back as able for OT evaluation. Thank you! Norman Herrlich, MS OTR/L  Pager: [name] 01/20/2017, 9:10 AM

## 2017-01-20 NOTE — Evaluation (Signed)
Occupational Therapy Evaluation Patient Details Name: Sarah Kline MRN: [id number] DOB: [birth year] Today's Date: 01/20/2017    History of Present Illness Patient presented to the hospital with left sided weakness and facial droop. Her MRA showed potential hydrocephalus but no occlusion. She is blind and her left eye and has difficulty seeing out of her right. She can seemostly shadows. PMH: DMII, HTN, Sickle cell trait,    Clinical Impression   PTA, pt was independent with basic ADL and functional mobility. Pt's husband completes all home management tasks. Pt is legally blind at baseline and required supervision during functional mobility in unfamiliar environment but continues to decline use of cane. Pt is functioning at baseline for ADL otherwise and feel in her typicaly home environment she will be independent. Pt and family report no questions or concerns. Sensation deficits and weakness of her L side have resolved. No further acute OT needs identified and OT will sign off acutely.    Follow Up Recommendations  No OT follow up;Supervision - Intermittent    Equipment Recommendations  None recommended by OT    Recommendations for Other Services       Precautions / Restrictions Precautions Precautions: Fall Precaution Comments: Pt legally blind Restrictions Weight Bearing Restrictions: No      Mobility Bed Mobility Overal bed mobility: Independent             General bed mobility comments: No difficulty   Transfers Overall transfer level: Needs assistance Equipment used: None Transfers: Sit to/from Stand Sit to Stand: Supervision         General transfer comment: Due to visual impairment at baseline.    Balance Overall balance assessment: Needs assistance Sitting-balance support: No upper extremity supported Sitting balance-Leahy Scale: Good     Standing balance support: Single extremity supported Standing balance-Leahy Scale: Good Standing balance  comment: Does reach for wall and furniture to guide herself rather than for support due to visual deficits.                            ADL Overall ADL's : At baseline                                       General ADL Comments: Requires verbal cues to navigate environment due to visual problems in new environment.     Vision Baseline Vision/History: Legally blind Patient Visual Report: No change from baseline Additional Comments: Pt legally blind at baseline.     Perception     Praxis      Pertinent Vitals/Pain Pain Assessment: No/denies pain     Hand Dominance Right   Extremity/Trunk Assessment Upper Extremity Assessment Upper Extremity Assessment: Overall WFL for tasks assessed   Lower Extremity Assessment Lower Extremity Assessment: Overall WFL for tasks assessed       Communication Communication Communication: No difficulties   Cognition Arousal/Alertness: Awake/alert Behavior During Therapy: WFL for tasks assessed/performed Overall Cognitive Status: Within Functional Limits for tasks assessed                     General Comments       Exercises       Shoulder Instructions      Home Living Family/patient expects to be discharged to:: Private residence Living Arrangements: Spouse/significant other;Children Available Help at Discharge: Family Type of Home: House Home Access:  Stairs to enter [employer] of Steps: 3   Home Layout: One level;Laundry or work area in basement     [employer] Shower/Tub: Teacher, early years/pre: [employer]: None          Prior Functioning/Environment Level of Independence: Independent        Comments: Pt able to ambulate in her home independently. Able to dress herself independently. Husband completes home making tasks.        OT Problem List: Impaired vision/perception      OT Treatment/Interventions:      OT Goals(Current goals can  be found in the care plan section) Acute Rehab OT Goals Patient Stated Goal: to go home OT Goal Formulation: With patient/family Time For Goal Achievement: 01/27/17 Potential to Achieve Goals: Good  OT Frequency:     Barriers to D/C:            Co-evaluation              End of Session    Activity Tolerance: Patient tolerated treatment well Patient left: in bed;with bed alarm set;with family/visitor present  OT Visit Diagnosis: Low vision, both eyes (H54.2);Hemiplegia and hemiparesis Hemiplegia - Right/Left: Left Hemiplegia - dominant/non-dominant: Non-Dominant Hemiplegia - caused by: Unspecified (Has since resolved)                ADL either performed or assessed with clinical judgement  Time: 1152-1208 OT Time Calculation (min): 16 min Charges:  OT General Charges $OT Visit: 1 Procedure OT Evaluation $OT Eval Moderate Complexity: 1 Procedure G-Codes: OT G-codes **NOT FOR INPATIENT CLASS** Functional Assessment Tool Used: AM-PAC 6 Clicks Daily Activity Functional Limitation: Self care Self Care Current Status (Z6109): At least 1 percent but less than 20 percent impaired, limited or restricted Self Care Goal Status (U0454): At least 1 percent but less than 20 percent impaired, limited or restricted Self Care Discharge Status [phone]): At least 1 percent but less than 20 percent impaired, limited or restricted   Norman Herrlich, MS OTR/L  Pager: [place] 01/20/2017, 1:56 PM

## 2017-01-20 NOTE — Progress Notes (Signed)
Physical Therapy Evaluation Patient Details Name: Sarah Kline MRN: [id number] DOB: January 31, 1966 Today's Date: 01/20/2017   History of Present Illness  Patient presented to the hospital with left sided weakness and facial droop. 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 She can seemostly shadows. PMH: DMII, HTN, Sickle cell trait,   Clinical Impression  Patient requires supervision and tactile cuing for ambulation and transfers but this is baseline 2nd to vision. Therapy suggested the use of a cane but patient feels like when she is home in her own environment she will likely not need it. The patient has no need for further skilled therapy at this time.     Follow Up Recommendations No PT follow up    Equipment Recommendations  Cane    Recommendations for Other Services       Precautions / Restrictions Restrictions Weight Bearing Restrictions: No      Mobility  Bed Mobility Overal bed mobility: Independent             General bed mobility comments: No difficulty   Transfers Overall transfer level: Needs assistance Equipment used: None Transfers: Sit to/from Stand Sit to Stand: Supervision         General transfer comment: family helped patient to the bathroom 2nd to vision impairment  Ambulation/Gait Ambulation/Gait assistance: Supervision Ambulation Distance (Feet): 100 Feet Assistive device: None     Gait velocity interpretation: Below normal speed for age/gender General Gait Details: Patien required guidance of therpist 2nd to vision. She had more of a drift to the left nut had no balance loss. She may benefit from the use of a cane at home    Stairs            Wheelchair Mobility    Modified Rankin (Stroke Patients Only) Modified Rankin (Stroke Patients Only) Pre-Morbid Rankin Score: No significant disability Modified Rankin: Slight disability      Balance Overall balance assessment: Needs assistance Sitting-balance support: No upper extremity supported Sitting balance-Leahy Scale: Good     Standing balance support: Single extremity supported Standing balance-Leahy Scale: Fair Standing balance comment: felt more comfortable holding onto the wall but that may have been more for direction.                              Pertinent Vitals/Pain Pain Assessment: No/denies pain (No haedache today )    Home Living Family/patient expects to be discharged to:: Private residence Living Arrangements: Spouse/significant other;Children Available Help at Discharge: Family Type of Home: House Home Access: Stairs to enter   Technical brewer of Steps: 3 Home Layout: One level;Laundry or work area in basement        Prior Function Level of Independence: Independent         Comments: was able to walk around her room indepdently. Her husband reported he helped her but patient reports she was able to do most things on her own.      Hand Dominance   Dominant Hand: Right    Extremity/Trunk Assessment   Upper Extremity Assessment Upper Extremity Assessment: Defer to OT evaluation    Lower Extremity Assessment Lower Extremity Assessment: Overall WFL for tasks assessed       Communication   Communication: No difficulties  Cognition Arousal/Alertness: Awake/alert Behavior During Therapy: WFL for tasks assessed/performed Overall Cognitive Status: Within Functional Limits for tasks  assessed                      General Comments      Exercises     Assessment/Plan    PT Assessment Patent does not need any further PT services  PT Problem List         PT Treatment Interventions      PT Goals (Current goals can be found in the Care Plan section)  Acute Rehab PT Goals Patient Stated Goal: to go home PT Goal Formulation: With patient Time For Goal Achievement: 01/27/17 Potential to Achieve  Goals: Good    Frequency     Barriers to discharge        Co-evaluation               End of Session Equipment Utilized During Treatment: Gait belt Activity Tolerance: Patient tolerated treatment well Patient left: in bed (sitting edge of bed with student nurse and family ) Nurse Communication: Mobility status PT Visit Diagnosis: Other abnormalities of gait and mobility (R26.89)    Functional Assessment Tool Used: AM-PAC 6 Clicks Basic Mobility;Clinical judgement Functional Limitation: Mobility: Walking and moving around Mobility: Walking and Moving Around Current Status (P5916): At least 20 percent but less than 40 percent impaired, limited or restricted Mobility: Walking and Moving Around Goal Status [phone]): At least 20 percent but less than 40 percent impaired, limited or restricted Mobility: Walking and Moving Around Discharge Status [phone]): At least 20 percent but less than 40 percent impaired, limited or restricted    Time: 1052-1106 PT Time Calculation (min) (ACUTE ONLY): 14 min   Charges:   PT Evaluation $PT Eval Moderate Complexity: 1 Procedure     PT G Codes:   PT G-Codes **NOT FOR INPATIENT CLASS** Functional Assessment Tool Used: AM-PAC 6 Clicks Basic Mobility;Clinical judgement Functional Limitation: Mobility: Walking and moving around Mobility: Walking and Moving Around Current Status (T0177): At least 20 percent but less than 40 percent impaired, limited or restricted Mobility: Walking and Moving Around Goal Status [phone]): At least 20 percent but less than 40 percent impaired, limited or restricted Mobility: Walking and Moving Around Discharge Status [phone]): At least 20 percent but less than 40 percent impaired, limited or restricted     Carney Living PT DPT  01/20/2017, 11:35 AM

## 2017-01-20 NOTE — Progress Notes (Signed)
  Echocardiogram 2D Echocardiogram has been performed.  Diamond Nickel 01/20/2017, 9:27 AM

## 2017-01-20 NOTE — Progress Notes (Signed)
*  PRELIMINARY RESULTS* Vascular Ultrasound Carotid Duplex (Doppler) has been completed.  Preliminary findings: Bilateral: No significant (1-39%) ICA stenosis. Antegrade vertebral flow.     Landry Mellow, RDMS, RVT  01/20/2017, 1:45 PM

## 2017-01-20 NOTE — Progress Notes (Signed)
MD orders to D/c patient. IV removed. Tele d/c'd.  Discharge instructions given, questions answered; daughter signed on patient's behalf as patient is legally blind.  Patient taken to car via wheelchair.

## 2017-01-20 NOTE — Progress Notes (Signed)
SLP Cancellation Note  Patient Details Name: RONDA RAJKUMAR MRN: [id number] DOB: 06-Aug-1966   Cancelled treatment:       Reason Eval/Treat Not Completed: SLP screened, no needs identified, will sign off  Nimesh Riolo B. Rutherford Nail, M.S., CCC-SLP Speech-Language Pathologist  Yanuel Tagg 01/20/2017, 12:32 PM

## 2017-01-20 NOTE — Progress Notes (Signed)
Family Medicine Teaching Service Daily Progress Note Intern Pager: [phone]  Patient name: Sarah Kline Medical record number: [id number] Date of birth: 11-03-1966 Age: 51 y.o. Gender: female  Primary Care Provider: Kathrine Cords, MD Consultants: None Code Status: Full code  Pt Overview and Major Events to Date:  1. Admitted to [facility] on 01/19/17   Assessment and Plan: Sarah Kline is a 51 y.o. female presenting with an episode of weakness and high blood pressures. PMH is significant for DM, HTN, Blindness.   Hx of transient left arm weakness, left facial droop, and slurred speech- resolved: Concern for TIA. Patient has several risk factors including uncontrolled HTN and T2DM and >20% ASCVD risk. Not on a statin or daily aspirin. Has had one other episode of hand weakness in December that also resolved. Initial head CT negative. MRI pending. BP Elevated up to 204/100 in ED and 171/90 this AM. -monitor on telemetry - start amlodipine and lisinopril 5mg  -vitals per floor protocol -up with assistance  -risk stratification labs, A1C, TSH, lipid panel- abnormal - started on lipitor 80mg  daily - f/u carotid US -echocardiogram- pending -MRI/ MRA head ordered, pending - ASA 325mg  daily - PT/OT consult  Accelerated HTN- Has been 190-200's/90's at home. BP 171/90 this AM. Denies any CP, SOB or headaches.  -permissive HTN for now for next 24 hrs. -Hydralazine IV 5mg  q4h PRN SBP>220, DBP>110 -amlodipine 5mg  and lisinopril 5mg  daily  Left corneal abrasion- wood's lamp test demonstrated corneal abrasion. had recent retinal reattachment surgery January 8th. Eye has been irritated since but in past week has become more red and painful. Spoke with [facility] opthalmology on call, Dr Hedwig Morton. With patient's permission, photographs taken and sent to opthalmologist.  Ocular pressure at follow up on 1/16 was 12.  Pressure normal in ED (15, 16, 11).  Closed angle glaucoma unlikely to be cause of  ocular pain at this time.  No evidence of endophthalmitis.  Discussed findings with Dr Hedwig Morton, who agreed with lubricating abx ointment.  No need for pseudomonal coverage at this time.  [name].  -erythromycin ointment QID, can increase to 6x daily if needed for comfort -continue to monitor  T2DM- last A1C December 2017 5.8. Takes metformin 1000 mg BID.  Last dose 2/18 pm. CBG this AM 123 -hold home metformin -recheck A1C -CBGs ACHS -sensitive SSI   Elevated Creatinine- on admission Cr 1.87 down to 1.79 this AM. Previously elevated to 2.12-2.19. Patient without diagnosis of CKD.   -continue to monitor -avoid nephrotoxic agents -consider outpatient workup/ renal ultrasound  Urinary incontinence: Per patient, she was unaware that she wet herself.  No back pain. No saddle anaesthesia. No fecal incontinence.  -obtain bladder scan/ PVR to evaluate for possible overflow incontinence  FEN/GI: NPO pending swallow study, then heart healthy carb modified diet Prophylaxis: SCD's  Disposition: pending clinical improvement.  Anticipate dc in next 24-48 hours.  Subjective:  Patient feels well this morning and denies any weakness, chest pain or shortness of breath.   Objective: Temp:  [97.9 F (36.6 C)-99.2 F (37.3 C)] 98 F (36.7 C) (02/20 0645) Pulse Rate:  [67-83] 77 (02/20 0645) Resp:  [12-20] 16 (02/20 0645) BP: (155-205)/(79-100) 171/90 (02/20 0645) SpO2:  [93 %-100 %] 100 % (02/20 0645) Weight:  [215 lb (97.5 kg)] 215 lb (97.5 kg) (02/19 1619) Physical Exam:  General: 51 year old woman sitting up in bed appearing comfortable, in no acute distress Cardiovascular: Regular rate and rhythm, no murmurs  rubs or gallops, 2+ pulses palpable  Respiratory: No increased work of breathing, clear to auscultation bilaterally, no wheezing or rhonchi Abdomen: Soft, nontender nondistended, no organomegaly, positive bowel sounds MSK: No gross deformities,  full range of motion Neuro: CN 2-12 grossly intact with exception of vision, 5 out of 5 strength in upper and lower extremities bilaterally, sensation intact throughout, speech normal and alert and oriented 3 Psych: Normal mood and affect  Laboratory:  Recent Labs Lab 01/19/17 1646 01/20/17 0450  WBC 7.6 7.5  HGB 10.7* 9.5*  HCT 32.2* 28.4*  PLT 341 320    Recent Labs Lab 01/19/17 1646 01/20/17 0450  NA 140 137  K 4.2 4.1  CL 109 109  CO2 21* 20*  BUN 23* 25*  CREATININE 1.87* 1.79*  CALCIUM 9.3 8.8*  PROT 7.1  --   BILITOT 1.1  --   ALKPHOS 86  --   ALT 14  --   AST 16  --   GLUCOSE 112* 123*    Imaging/Diagnostic Tests: Ct Head Wo Contrast  Result Date: 01/19/2017 CLINICAL DATA:  Left arm, leg and face weakness and tingling since 01/17/2017. EXAM: CT HEAD WITHOUT CONTRAST TECHNIQUE: Contiguous axial images were obtained from the base of the skull through the vertex without intravenous contrast. COMPARISON:  Head CT scan 07/31/2012. FINDINGS: Brain: Prominence of the ventricular system is stable in appearance. No evidence of hemorrhage, infarct, mass lesion, midline shift or abnormal extra-axial fluid collection is identified. Vascular: Negative. Skull: Intact. Sinuses/Orbits: Prosthetic left globe is noted. Other: None. 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 No acute intracranial abnormality. Electronically Signed   By: Inge Rise M.D.   On: 01/19/2017 20:41   Mr Brain Wo Contrast  Result Date: 01/20/2017 CLINICAL DATA:  High blood pressure. Episodic weakness. History of diabetes. History of sickle cell. EXAM: MRI HEAD WITHOUT CONTRAST MRA HEAD WITHOUT CONTRAST TECHNIQUE: Multiplanar, multiecho pulse sequences of the brain and surrounding structures were obtained without intravenous contrast. Angiographic images of the head were obtained using MRA technique without contrast. COMPARISON:  CT  head 01/19/2017.  CT head 07/31/2012. FINDINGS: MRI HEAD FINDINGS Brain: No evidence for acute stroke, acute hemorrhage, or mass lesion. Abnormally prominent lateral, third, and fourth ventricles, out of proportion to white matter disease or cortical volume loss, suggesting communicating hydrocephalus. Small extra-axial collection LEFT middle cranial fossa, mass effect on the medial temporal lobe, 7 x 21 mm, consistent with a small incidental arachnoid cyst. Prominent cisterna magna. T2 and FLAIR hyperintensities throughout the periventricular and subcortical white matter could represent a combination of small vessel disease as well as transependymal absorption. Small chronic lacunar infarcts in the RIGHT thalamus. Vascular: Flow voids are maintained throughout the carotid, basilar, and vertebral arteries. There are no areas of chronic hemorrhage. Skull and upper cervical spine: Unremarkable visualized calvarium, skullbase, and cervical vertebrae. Pituitary, pineal, cerebellar tonsils unremarkable. No upper cervical cord lesions. Sinuses/Orbits: Prosthetic LEFT globe.  No acute findings. Other: None. Compared with 2013, biventricular diameter is fairly stable. MRA HEAD FINDINGS The internal carotid arteries are widely patent. The basilar artery is widely patent. Vertebrals are codominant. No intracranial stenosis or aneurysm. IMPRESSION: Abnormally prominent lateral, third, and fourth ventricles, out of proportion to white matter disease and cortical volume loss suggesting communicating hydrocephalus. Small arachnoid cyst LEFT middle cranial fossa. T2 and FLAIR hyperintensities throughout the white matter could represent a combination of small vessel disease as well as transependymal absorption. No proximal  stenosis or occlusion on MRA. Electronically Signed   By: Staci Righter M.D.   On: 01/20/2017 09:19   Mr Jodene Nam Head/brain GP Cm  Result Date: 01/20/2017 CLINICAL DATA:  High blood pressure. Episodic weakness.  History of diabetes. History of sickle cell. EXAM: MRI HEAD WITHOUT CONTRAST MRA HEAD WITHOUT CONTRAST TECHNIQUE: Multiplanar, multiecho pulse sequences of the brain and surrounding structures were obtained without intravenous contrast. Angiographic images of the head were obtained using MRA technique without contrast. COMPARISON:  CT head 01/19/2017.  CT head 07/31/2012. FINDINGS: MRI HEAD FINDINGS Brain: No evidence for acute stroke, acute hemorrhage, or mass lesion. Abnormally prominent lateral, third, and fourth ventricles, out of proportion to white matter disease or cortical volume loss, suggesting communicating hydrocephalus. Small extra-axial collection LEFT middle cranial fossa, mass effect on the medial temporal lobe, 7 x 21 mm, consistent with a small incidental arachnoid cyst. Prominent cisterna magna. T2 and FLAIR hyperintensities throughout the periventricular and subcortical white matter could represent a combination of small vessel disease as well as transependymal absorption. Small chronic lacunar infarcts in the RIGHT thalamus. Vascular: Flow voids are maintained throughout the carotid, basilar, and vertebral arteries. There are no areas of chronic hemorrhage. Skull and upper cervical spine: Unremarkable visualized calvarium, skullbase, and cervical vertebrae. Pituitary, pineal, cerebellar tonsils unremarkable. No upper cervical cord lesions. Sinuses/Orbits: Prosthetic LEFT globe.  No acute findings. Other: None. Compared with 2013, biventricular diameter is fairly stable. MRA HEAD FINDINGS The internal carotid arteries are widely patent. The basilar artery is widely patent. Vertebrals are codominant. No intracranial stenosis or aneurysm. IMPRESSION: Abnormally prominent lateral, third, and fourth ventricles, out of proportion to white matter disease and cortical volume loss suggesting communicating hydrocephalus. Small arachnoid cyst LEFT middle cranial fossa. T2 and FLAIR hyperintensities  throughout the white matter could represent a combination of small vessel disease as well as transependymal absorption. No proximal stenosis or occlusion on MRA. Electronically Signed   By: Staci Righter M.D.   On: 01/20/2017 09:19    Eloise Levels, MD 01/20/2017, 9:26 AM PGY-1, [place] Intern pager: [phone], text pages welcome

## 2017-01-20 NOTE — Discharge Summary (Signed)
[facility] Discharge Summary  Patient name: Sarah Kline Medical record number: [id number] Date of birth: [birth year] Age: 51 y.o. Gender: female Date of Admission: 01/19/2017  Date of Discharge: 01/20/17  Admitting Physician: Alveda Reasons, MD  Primary Care Provider: Kathrine Cords, MD Consultants: None  Indication for Hospitalization:  Hypertension, TIA  Discharge Diagnoses/Problem List:  TIA Accelerated HTN Left corneal abrasion T2DM   Disposition: discharge home  Discharge Condition: stable, improved  Discharge Exam:  General: 51 year old woman sitting up in bed appearing comfortable, in no acute distress Cardiovascular: Regular rate and rhythm, no murmurs rubs or gallops, 2+ pulses palpable  Respiratory: No increased work of breathing, clear to auscultation bilaterally, no wheezing or rhonchi Abdomen: Soft, nontender nondistended, no organomegaly, positive bowel sounds MSK: No gross deformities, full range of motion Neuro: CN 2-12 grossly intact with exception of vision, 5 out of 5 strength in upper and lower extremities bilaterally, sensation intact throughout, speech normal and alert and oriented 3 Psych: Normal mood and affect  Brief Hospital Course:  Presented to emergency department for concern for elevated blood pressure at home that were high with systolic to the 244W.  Also noted symptoms consistent with TIA, with left upper extremity weakness and numbness and left-sided facial numbness and tingling that lasted about 2 minutes on the Saturday prior to admission.  Treated with permissive hypertension and then started on her home medications the morning after admission, due to her TIA symptoms being several days PTA.  Also had stroke work up that was negative. The patient also noted L eye pain that caused her headaches and made her nauseas in the past. Reported similar pain in the eye in ED.  Had a recent surgery to reattach her retina  on 12/08/16. Wood's lamp showed corneal abrasion. Spoke with [facility] opthalmology on call, Dr Hedwig Morton. With patient's permission, photographs taken and sent to opthalmologist.  Ocular pressure at follow up on 1/16 was 12.  Pressure normal in ED (15, 16, 11).  Closed angle glaucoma unlikely to be cause of ocular pain at this time.  No evidence of endophthalmitis.  Discussed findings with Dr Hedwig Morton, who agreed with lubricating abx ointment.  No need for pseudomonal coverage at this time.  [name]. At the time of discharge, patient was asymptomatic and blood pressure was at her baseline on her home regimen.  She will follow up at [facility] for nurse BP check in two days and has an appointment with her PCP next week and Opthalmology follow up next week as well.   Issues for Follow Up:  1. TIA:  ASCVD risk >7.5%, started on high intensity statin and ASA.  2. Hypertension: continued on lisinopril 5mg  and amlodipine 5mg  daily and highly encouraged patient to make nursing BP check in two days.  PCP can discuss futher management next week at appointment 3. Corneal abrasion: Prescribed erythomycin ointment. Has follow up scheduled on 01/27/17  Significant Procedures: none  Significant Labs and Imaging:   Recent Labs Lab 01/19/17 1646 01/20/17 0450  WBC 7.6 7.5  HGB 10.7* 9.5*  HCT 32.2* 28.4*  PLT 341 320    Recent Labs Lab 01/19/17 1646 01/20/17 0450  NA 140 137  K 4.2 4.1  CL 109 109  CO2 21* 20*  GLUCOSE 112* 123*  BUN 23* 25*  CREATININE 1.87* 1.79*  CALCIUM 9.3 8.8*  ALKPHOS 86  --   AST 16  --   ALT 14  --  ALBUMIN 3.7  --     Results/Tests Pending at Time of Discharge: none  Discharge Medications:  Allergies as of 01/20/2017      Reactions   Bactrim [sulfamethoxazole-trimethoprim] Rash      Medication List    TAKE these medications   amLODipine 5 MG tablet Commonly known as:  NORVASC Take 1 tablet (5 mg total) by mouth daily. What changed:   Another medication with the same name was removed. Continue taking this medication, and follow the directions you see here.   aspirin 325 MG tablet Take 1 tablet (325 mg total) by mouth daily.   atorvastatin 80 MG tablet Commonly known as:  LIPITOR Take 1 tablet (80 mg total) by mouth daily at 6 PM.   erythromycin ophthalmic ointment Place into the left eye every 6 (six) hours.   lisinopril 5 MG tablet Commonly known as:  PRINIVIL,ZESTRIL Take on by mouth in morning and take a second tab in evening if systolic BP is above 572 What changed:  how much to take  how to take this  when to take this  additional instructions   metFORMIN 500 MG tablet Commonly known as:  GLUCOPHAGE Take 2 tablets (1,000 mg total) by mouth 2 (two) times daily with a meal. What changed:  how much to take  when to take this   traMADol 50 MG tablet Commonly known as:  ULTRAM Take or or two tabs by mouth  at beginning of headache and may repeat every 8 hours max 6 tabs a day What changed:  how much to take  how to take this  when to take this  additional instructions       Discharge Instructions: Please refer to Patient Instructions section of EMR for full details.  Patient was counseled important signs and symptoms that should prompt return to medical care, changes in medications, dietary instructions, activity restrictions, and follow up appointments.   Follow-Up Appointments:   Eloise Levels, MD 01/21/2017, 2:23 PM PGY-1, [place]

## 2017-01-20 NOTE — Discharge Instructions (Signed)
You were admitted for high blood pressure and a likely mini-stroke, called a transient ischemic attack (TIA).  Fortunately all of your symptoms have resolved. Your blood pressure is now down to 168/71 at discharge.  I want you to continue taking 5mg  amlodipine and 5mg  lisinopril daily.  Please call the office to schedule a nursing visit to have a blood pressure check.  Also you have a follow up appointment with Dr. Lorenso Courier on 01/30/17 at 10:30AM.   For your corneal abrasion, I would use the erythromycin ointment as needed, up to 6 times daily. Please rememember to follow up with your opthalmologist on Tuesday.  Take care

## 2017-01-20 NOTE — ED Provider Notes (Signed)
[facility] DEPT Provider Note   CSN: [id number] Arrival date & time: 01/19/17  1558     History   Chief Complaint Chief Complaint  Patient presents with  . Hypertension    HPI Sarah Kline is a 51 y.o. female.  HPI Pt comes in with cc of elevated BP and slurred speech. PT here with family. She has hx of sickle cell, DM, HTN. Pt also has hx of TIA and is legally blind. Pt reports that on Sat she had an episode 2-3 minutes where she had slurred speech, and some facial and LUE numbness. PT hasnt had any repeat episodes. Pt also has L eye redness. Pt has had recent eye surgery for retinal detachment. Pt also reports that his BP has been more elevated than usual - > 180 SBP.    Past Medical History:  Diagnosis Date  . Diabetes mellitus   . Hypertension   . Sickle cell disease, type [facility])     Patient Active Problem List   Diagnosis Date Noted  . Corneal abrasion, left 01/20/2017  . TIA (transient ischemic attack) 01/19/2017  . Accelerated hypertension 01/19/2017  . Left eye pain 01/19/2017  . Urinary incontinence 01/19/2017  . Legally blind 11/12/2016  . Mass 11/12/2016  . Diabetes mellitus ([place]) 11/12/2012  . HTN (hypertension) 11/12/2012  . 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 EYE SURGERY    . GANGLION CYST EXCISION     Left dorsal wrist    OB History    No data available       Home Medications    Prior to Admission medications   Medication Sig Start Date End Date Taking? Authorizing Provider  amLODipine (NORVASC) 2.5 MG tablet Take 5 mg by mouth daily.   Yes Historical Provider, MD  lisinopril (PRINIVIL,ZESTRIL) 5 MG tablet Take on by mouth in morning and take a second tab in evening if systolic BP is above 440 Patient taking differently: Take 5 mg by mouth daily. And may take another 5 mg in the evening if Systolic B/P is 347 or greater 12/10/16  Yes Dickie La, MD  metFORMIN (GLUCOPHAGE) 500 MG  tablet Take 2 tablets (1,000 mg total) by mouth 2 (two) times daily with a meal. Patient taking differently: Take 500 mg by mouth 2 (two) times daily.  11/12/16  Yes Archie Patten, MD  traMADol (ULTRAM) 50 MG tablet Take or or two tabs by mouth  at beginning of headache and may repeat every 8 hours max 6 tabs a day Patient taking differently: Take 50-100 mg by mouth See admin instructions. At the beginning of a headache and may repeat every 8 hours if needed (max 6 tabs a day) 12/10/16  Yes Dickie La, MD  amLODipine (NORVASC) 5 MG tablet Take 1 tablet (5 mg total) by mouth daily. Patient not taking: Reported on 01/19/2017 12/12/16   Rogue Bussing, MD    Family History Family History  Problem Relation Age of Onset  . Asthma Mother   . COPD Mother   . Hypertension Mother   . Stroke Mother     Social History Social History  Substance Use Topics  . Smoking status: Never Smoker  . Smokeless tobacco: Never Used  . Alcohol use 1.2 oz/week    2 Shots of liquor per week     Comment: twice per month     Allergies   Bactrim [sulfamethoxazole-trimethoprim]   Review  of Systems Review of Systems  Constitutional: Negative for fever.  Eyes: Positive for pain, discharge and redness.  Allergic/Immunologic: Positive for immunocompromised state.  Neurological: Positive for dizziness and numbness.    ROS 10 Systems reviewed and are negative for acute change except as noted in the HPI.     Physical Exam Updated Vital Signs BP 182/89   Pulse 80   Temp 98.2 F (36.8 C)   Resp 18   Ht 5\' 4"  (1.626 m)   Wt 215 lb (97.5 kg)   LMP 01/01/2017 (Exact Date)   SpO2 95%   BMI 36.90 kg/m   Physical Exam  Constitutional: She is oriented to person, place, and time. She appears well-developed and well-nourished.  HENT:  Head: Normocephalic and atraumatic.  Eyes:  L eye is erythematous.  No photophobia Gross bedside visual acuity: pt is legally blind Eye lid eversion  reveals no foreign body Pt has +++ chemosis Fluorecin test under woods lamp reveals conjunctival hemorrhage EYE PRESSURES on L side 15, 16 and 11.    Neck: Neck supple.  Cardiovascular: Normal rate, regular rhythm and normal heart sounds.   Pulmonary/Chest: Effort normal. No respiratory distress.  Abdominal: Soft. She exhibits no distension. There is no tenderness. There is no rebound and no guarding.  Neurological: She is alert and oriented to person, place, and time.  Skin: Skin is warm and dry.  Nursing note and vitals reviewed.    ED Treatments / Results  Labs (all labs ordered are listed, but only abnormal results are displayed) Labs Reviewed  CBC WITH DIFFERENTIAL/PLATELET - Abnormal; Notable for the following:       Result Value   RBC 3.82 (*)    Hemoglobin 10.7 (*)    HCT 32.2 (*)    All other components within normal limits  COMPREHENSIVE METABOLIC PANEL - Abnormal; Notable for the following:    CO2 21 (*)    Glucose, Bld 112 (*)    BUN 23 (*)    Creatinine, Ser 1.87 (*)    GFR calc non Af Amer 30 (*)    GFR calc Af Amer 35 (*)    All other components within normal limits    EKG  EKG Interpretation None       Radiology Ct Head Wo Contrast  Result Date: 01/19/2017 CLINICAL DATA:  Left arm, leg and face weakness and tingling since 01/17/2017. EXAM: CT HEAD WITHOUT CONTRAST TECHNIQUE: Contiguous axial images were obtained from the base of the skull through the vertex without intravenous contrast. COMPARISON:  Head CT scan 07/31/2012. FINDINGS: Brain: Prominence of the ventricular system is stable in appearance. No evidence of hemorrhage, infarct, mass lesion, midline shift or abnormal extra-axial fluid collection is identified. Vascular: Negative. Skull: Intact. Sinuses/Orbits: Prosthetic left globe is noted. Other: None. 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 No acute intracranial  abnormality. Electronically Signed   By: Inge Rise M.D.   On: 01/19/2017 20:41    Procedures Procedures (including critical care time)  Medications Ordered in ED Medications  erythromycin ophthalmic ointment (not administered)  tetracaine (PONTOCAINE) 0.5 % ophthalmic solution 2 drop (2 drops Left Eye Given by Other 01/19/17 2319)  fluorescein ophthalmic strip 1 strip (1 strip Left Eye Given by Other 01/19/17 2328)     Initial Impression / Assessment and Plan / ED Course  I have reviewed the triage vital signs and the nursing notes.  Pertinent labs & imaging results that were available during  my care of the patient were reviewed by me and considered in my medical decision making (see chart for details).     Pt comes in with TIA like symptoms. Pt has hx of HTN, DM, Sickle cell, TIA. BP is elevated. We will consult medicine team to see if they can admit pt for TIA as her ABCD2 score is 4 and she has sickle cell and TIA hx with uncontrolled BP.  Pt also has corneal abrasion. Deferring antibiotics to admitting team as they will contact optho.  Final Clinical Impressions(s) / ED Diagnoses   Final diagnoses:  Abrasion of left cornea, initial encounter  Transient cerebral ischemia, unspecified type    New Prescriptions New Prescriptions   No medications on file     Varney Biles, MD 01/20/17 0015

## 2017-01-21 LAB — VAS US CAROTID
LCCADSYS: -90 cm/s
LCCAPDIAS: 15 cm/s
LCCAPSYS: 127 cm/s
LEFT ECA DIAS: -12 cm/s
LEFT VERTEBRAL DIAS: 10 cm/s
Left CCA dist dias: -13 cm/s
Left ICA dist dias: -21 cm/s
Left ICA dist sys: -89 cm/s
Left ICA prox dias: -24 cm/s
Left ICA prox sys: -95 cm/s
RCCADSYS: -69 cm/s
RCCAPDIAS: 16 cm/s
RCCAPSYS: 89 cm/s
RIGHT ECA DIAS: -12 cm/s
RIGHT VERTEBRAL DIAS: 21 cm/s

## 2017-01-21 LAB — HEMOGLOBIN A1C
Hgb A1c MFr Bld: 5.7 % — ABNORMAL HIGH (ref 4.8–5.6)
MEAN PLASMA GLUCOSE: 117

## 2017-01-21 NOTE — Care Management Note (Signed)
Case Management Note  Patient Details  Name: Sarah Kline MRN: [id number] Date of Birth: 09/24/1966  Subjective/Objective:                    Action/Plan: 01/21/2017 at 0828 am: Pt discharged home late yesterday with family. No f/u per PT/OT. Pt with insurance and PCP. No further needs per CM.   Expected Discharge Date:  01/20/17               Expected Discharge Plan:  Home/Self Care  In-House Referral:     Discharge planning Services     Post Acute Care Choice:    Choice offered to:     DME Arranged:    DME Agency:     HH Arranged:    HH Agency:     Status of Service:  Completed, signed off  If discussed at [employer] of Stay Meetings, dates discussed:    Additional Comments:  Pollie Friar, RN 01/21/2017, 8:28 AM

## 2017-01-23 ENCOUNTER — Ambulatory Visit (INDEPENDENT_AMBULATORY_CARE_PROVIDER_SITE_OTHER): Payer: Medicaid Other | Admitting: *Deleted

## 2017-01-23 VITALS — BP 148/92 | HR 75

## 2017-01-23 DIAGNOSIS — Z0130 Encounter for examination of blood pressure without abnormal findings: Secondary | ICD-10-CM

## 2017-01-23 DIAGNOSIS — I10 Essential (primary) hypertension: Secondary | ICD-10-CM

## 2017-01-23 NOTE — Progress Notes (Signed)
   Patient in nurse clinic for blood pressure check. Patient is feeling well today.  Pt denies chest pain, SOB, dizziness, numbness/tingling of extremity, headache or n/v.  Patient stated she just started her new medication regimen two days ago.  Advised patient to schedule another visit for Monday 01/26/2017 to see if blood pressure is any better.  Patient was recently released from the hospital for high blood pressure.  Per Patient blood pressure taken at home was 200s/100s.  Patient also has a follow up on Friday 01/30/2017 for follow up. Will forward to PCP.   Derl Barrow, RN   Today's Vitals   01/23/17 0932 01/23/17 0944  BP: (!) 152/90 (!) 148/92  Pulse: 76 75  SpO2: 98% 98%  PainSc: 0-No pain 0-No pain

## 2017-01-26 ENCOUNTER — Ambulatory Visit (INDEPENDENT_AMBULATORY_CARE_PROVIDER_SITE_OTHER): Payer: Medicaid Other | Admitting: *Deleted

## 2017-01-26 VITALS — BP 156/82 | HR 84

## 2017-01-26 DIAGNOSIS — Z0130 Encounter for examination of blood pressure without abnormal findings: Secondary | ICD-10-CM

## 2017-01-26 DIAGNOSIS — I10 Essential (primary) hypertension: Secondary | ICD-10-CM

## 2017-01-26 NOTE — Progress Notes (Signed)
   Patient brought into clinic by her husband for blood pressure check.  Pt is feeling good.  Pt denies chest pain, headache, dizziness, visual changes, SOB or numbness/weakness in arms/legs.  Patient has follow up appt 01/30/17 at 10:30 AM with PCP.  Derl Barrow, RN  Today's Vitals   01/26/17 1425  BP: (!) 156/82  Pulse: 84  SpO2: 98%  PainSc: 0-No pain

## 2017-01-30 ENCOUNTER — Encounter: Payer: Self-pay | Admitting: Family Medicine

## 2017-01-30 ENCOUNTER — Ambulatory Visit (INDEPENDENT_AMBULATORY_CARE_PROVIDER_SITE_OTHER): Payer: Medicaid Other | Admitting: Family Medicine

## 2017-01-30 VITALS — BP 164/90 | HR 80 | Temp 98.2°F | Ht 64.0 in | Wt 211.0 lb

## 2017-01-30 DIAGNOSIS — E1122 Type 2 diabetes mellitus with diabetic chronic kidney disease: Secondary | ICD-10-CM | POA: Diagnosis not present

## 2017-01-30 DIAGNOSIS — N393 Stress incontinence (female) (male): Secondary | ICD-10-CM | POA: Diagnosis present

## 2017-01-30 DIAGNOSIS — S0502XD Injury of conjunctiva and corneal abrasion without foreign body, left eye, subsequent encounter: Secondary | ICD-10-CM | POA: Diagnosis not present

## 2017-01-30 DIAGNOSIS — I10 Essential (primary) hypertension: Secondary | ICD-10-CM | POA: Diagnosis not present

## 2017-01-30 DIAGNOSIS — R32 Unspecified urinary incontinence: Secondary | ICD-10-CM

## 2017-01-30 DIAGNOSIS — H5712 Ocular pain, left eye: Secondary | ICD-10-CM | POA: Diagnosis not present

## 2017-01-30 MED ORDER — LISINOPRIL 10 MG PO TABS: 10.0000 mg | ORAL_TABLET | Freq: Every day | ORAL | 0 refills | Status: DC

## 2017-01-30 NOTE — Assessment & Plan Note (Addendum)
D/c metformin. Continue to monitor CBGs intermittently- will attempt to get talking glucometer. Repeat A1c in June.

## 2017-01-30 NOTE — Assessment & Plan Note (Signed)
No saddle paresthesias, back pain, fecal incontinence. Most of her symptoms are suggestive of stress incontinence. GU exam deferred today as she has a pap smear last month with another provider that did not note any abnormalities. - will refer for pelvic floor PT.  - if no improvement or new symptoms, may benefit from complete urologic evaluation and/or urodynamics.

## 2017-01-30 NOTE — Progress Notes (Signed)
Subjective: CC: Hospital follow-up HPI: Patient is a 51 y.o. female with a past medical history of hypertension, diabetes, VI nerve palsy, legally blind presenting to clinic today for hospital follow-up.  The patient presented to the hospital with elevated blood pressures and symptoms of TIA. She underwent an negative stroke workup and her blood pressure was slowly improved. The hospitalization, she was also noted to have a left corneal abrasion for which she was supposed to follow-up with ophthalmology.  Since her discharge the pt notes that her BPs are still elevated. She states it was 213/102 yesterday, she took another lisinopril and it was still SBP 180. She notes she's only taken and extra dose of lisinopril once since discharge. She is asymptomatic. No headaches. No weakness or numbness. No dizziness on standing.    No left eye pain. She f/u with ophthalmology on Tuesday. They are going to see her again in May. She stopped the erythromycin ointment.   Taking metformin twice per week as her CBGs are good, wondering if she can stop.   She would like a blood pressure cuff that talks; glucometer that talks.   Social History: married  Flu Vaccine: UTD  Pneumonia Vaccine: UTD   ROS: All other systems reviewed and are negative besides above and urinary incontinence. She states that one hospital, she urinated on herself unknowingly. She's had an issue with incontinence in the past, most specifically with coughing, sneezing, or laughing. In the past, we topped about Kegel exercises, the patient states that she is unable to perform these. No dysuria or hematuria.  Past Medical History Patient Active Problem List   Diagnosis Date Noted  . Corneal abrasion, left 01/20/2017  . TIA (transient ischemic attack) 01/19/2017  . Accelerated hypertension 01/19/2017  . Left eye pain 01/19/2017  . Urinary incontinence 01/19/2017  . Legally blind 11/12/2016  . Mass 11/12/2016  . Diabetes  mellitus (Sarah Kline) 11/12/2012  . HTN (hypertension) 11/12/2012  . 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 amLODipine (NORVASC) 5 MG tablet Take 1 tablet (5 mg total) by mouth daily. 90 tablet 0  . aspirin 325 MG tablet Take 1 tablet (325 mg total) by mouth daily. 30 tablet 0  . atorvastatin (LIPITOR) 80 MG tablet Take 1 tablet (80 mg total) by mouth daily at 6 PM. 30 tablet 0  . erythromycin ophthalmic ointment Place into the left eye every 6 (six) hours. 3.5 g 0  . lisinopril (PRINIVIL,ZESTRIL) 10 MG tablet Take 1 tablet (10 mg total) by mouth daily. 30 tablet 0  . metFORMIN (GLUCOPHAGE) 500 MG tablet Take 2 tablets (1,000 mg total) by mouth 2 (two) times daily with a meal. (Patient taking differently: Take 500 mg by mouth 2 (two) times daily. ) 120 tablet 3  . traMADol (ULTRAM) 50 MG tablet Take or or two tabs by mouth  at beginning of headache and may repeat every 8 hours max 6 tabs a day (Patient taking differently: Take 50-100 mg by mouth See admin instructions. At the beginning of a headache and may repeat every 8 hours if needed (max 6 tabs a day)) 45 tablet 1   No current facility-administered medications for this visit.     Objective: Office vital signs reviewed. BP (!) 164/90   Pulse 80   Temp 98.2 F (36.8 C) (Oral)   Ht 5\' 4"  (1.626 m)   Wt 211 lb (95.7 kg)  LMP 01/01/2017 (Exact Date)   BMI 36.22 kg/m    Physical Examination:  General: Awake, alert, well- nourished, NAD Cardio: RRR, no m/r/g noted. No pitting edema Pulm: No increased WOB.  CTAB, without wheezes, rhonchi or crackles noted.   Assessment/Plan: Left eye pain Resolved.   Corneal abrasion, left Following up with opthalmology in May, per her report healing well.  Urinary incontinence No saddle paresthesias, back pain, fecal incontinence. Most of her symptoms are suggestive of stress incontinence. GU exam deferred  today as she has a pap smear last month with another provider that did not note any abnormalities. - will refer for pelvic floor PT.  - if no improvement or new symptoms, may benefit from complete urologic evaluation and/or urodynamics.   Diabetes mellitus ([place]) D/c metformin. Continue to monitor CBGs intermittently- will attempt to get talking glucometer. Repeat A1c in June.  HTN (hypertension) Asymptomatic HTN, sometimes with SBP as high as 200s per her report. - increase lisinopril to 10mg  daily - continue amlodipine. - f/u in 2 weeks with me for BP check and labs - will attempt to get her a talking BP cuff so she can take her BPs by herself.   Orders Placed This Encounter  Procedures  . Ambulatory referral to Physical Therapy    Referral Priority:   Routine    Referral Type:   Physical Medicine    Referral Reason:   Specialty Services Required    Requested Specialty:   Physical Therapy    Number of Visits Requested:   1    Meds ordered this encounter  Medications  . lisinopril (PRINIVIL,ZESTRIL) 10 MG tablet    Sig: Take 1 tablet (10 mg total) by mouth daily.    Dispense:  30 tablet    Refill:  [facility]Y-[facility] [place]

## 2017-01-30 NOTE — Patient Instructions (Addendum)
Take Tylenol 650mg  every 8 hours as needed for pain for your shoulder. Start taking lisinopril 10mg  daily. I will write a new prescription for the 10mg  tablets, until you get this filled you can take 2 of the 5mg  tablets. Continue amlodipine. Follow up with me in 2 weeks.  Your ophthalmologist would need to certify that you are totally blind or have significant vision impairment.    DASH Eating Plan DASH stands for "Dietary Approaches to Stop Hypertension." The DASH eating plan is a healthy eating plan that has been shown to reduce high blood pressure (hypertension). It may also reduce your risk for type 2 diabetes, heart disease, and stroke. The DASH eating plan may also help with weight loss. What are tips for following this plan? General guidelines   Avoid eating more than 2,300 mg (milligrams) of salt (sodium) a day. If you have hypertension, you may need to reduce your sodium intake to 1,500 mg a day.  Limit alcohol intake to no more than 1 drink a day for nonpregnant women and 2 drinks a day for men. One drink equals 12 oz of beer, 5 oz of wine, or 1 oz of hard liquor.  Work with your health care provider to maintain a healthy body weight or to lose weight. Ask what an ideal weight is for you.  Get at least 30 minutes of exercise that causes your heart to beat faster (aerobic exercise) most days of the week. Activities may include walking, swimming, or biking.  Work with your health care provider or diet and nutrition specialist (dietitian) to adjust your eating plan to your individual calorie needs. Reading food labels   Check food labels for the amount of sodium per serving. Choose foods with less than 5 percent of the Daily Value of sodium. Generally, foods with less than 300 mg of sodium per serving fit into this eating plan.  To find whole grains, look for the word "whole" as the first word in the ingredient list. Shopping   Buy products labeled as "low-sodium" or "no salt  added."  Buy fresh foods. Avoid canned foods and premade or frozen meals. Cooking   Avoid adding salt when cooking. Use salt-free seasonings or herbs instead of table salt or sea salt. Check with your health care provider or pharmacist before using salt substitutes.  Do not fry foods. Cook foods using healthy methods such as baking, boiling, grilling, and broiling instead.  Cook with heart-healthy oils, such as olive, canola, soybean, or sunflower oil. Meal planning    Eat a balanced diet that includes:  5 or more servings of fruits and vegetables each day. At each meal, try to fill half of your plate with fruits and vegetables.  Up to 6-8 servings of whole grains each day.  Less than 6 oz of lean meat, poultry, or fish each day. A 3-oz serving of meat is about the same size as a deck of cards. One egg equals 1 oz.  2 servings of low-fat dairy each day.  A serving of nuts, seeds, or beans 5 times each week.  Heart-healthy fats. Healthy fats called Omega-3 fatty acids are found in foods such as flaxseeds and coldwater fish, like sardines, salmon, and mackerel.  Limit how much you eat of the following:  Canned or prepackaged foods.  Food that is high in trans fat, such as fried foods.  Food that is high in saturated fat, such as fatty meat.  Sweets, desserts, sugary drinks, and other foods with  added sugar.  Full-fat dairy products.  Do not salt foods before eating.  Try to eat at least 2 vegetarian meals each week.  Eat more home-cooked food and less restaurant, buffet, and fast food.  When eating at a restaurant, ask that your food be prepared with less salt or no salt, if possible. What foods are recommended? The items listed may not be a complete list. Talk with your dietitian about what dietary choices are best for you. Grains  Whole-grain or whole-wheat bread. Whole-grain or whole-wheat pasta. Brown rice. [place]. Bulgur. Whole-grain and low-sodium  cereals. Pita bread. Low-fat, low-sodium crackers. Whole-wheat flour tortillas. Vegetables  Fresh or frozen vegetables (raw, steamed, roasted, or grilled). Low-sodium or reduced-sodium tomato and vegetable juice. Low-sodium or reduced-sodium tomato sauce and tomato paste. Low-sodium or reduced-sodium canned vegetables. Fruits  All fresh, dried, or frozen fruit. Canned fruit in natural juice (without added sugar). Meat and other protein foods  Skinless chicken or [place]. Ground chicken or [place]. Pork with fat trimmed off. Fish and seafood. Egg whites. Dried beans, peas, or lentils. Unsalted nuts, nut butters, and seeds. Unsalted canned beans. Lean cuts of beef with fat trimmed off. Low-sodium, lean deli meat. Dairy  Low-fat (1%) or fat-free (skim) milk. Fat-free, low-fat, or reduced-fat cheeses. Nonfat, low-sodium ricotta or cottage cheese. Low-fat or nonfat yogurt. Low-fat, low-sodium cheese. Fats and oils  Soft margarine without trans fats. Vegetable oil. Low-fat, reduced-fat, or light mayonnaise and salad dressings (reduced-sodium). Canola, safflower, olive, soybean, and sunflower oils. Avocado. Seasoning and other foods  Herbs. Spices. Seasoning mixes without salt. Unsalted popcorn and pretzels. Fat-free sweets. What foods are not recommended? The items listed may not be a complete list. Talk with your dietitian about what dietary choices are best for you. Grains  Baked goods made with fat, such as croissants, muffins, or some breads. Dry pasta or rice meal packs. Vegetables  Creamed or fried vegetables. Vegetables in a cheese sauce. Regular canned vegetables (not low-sodium or reduced-sodium). Regular canned tomato sauce and paste (not low-sodium or reduced-sodium). Regular tomato and vegetable juice (not low-sodium or reduced-sodium). Angie Fava. Olives. Fruits  Canned fruit in a light or heavy syrup. Fried fruit. Fruit in cream or butter sauce. Meat and other protein foods  Fatty cuts of  meat. Ribs. Fried meat. Berniece Salines. Sausage. Bologna and other processed lunch meats. Salami. Fatback. Hotdogs. Bratwurst. Salted nuts and seeds. Canned beans with added salt. Canned or smoked fish. Whole eggs or egg yolks. Chicken or [place] with skin. Dairy  Whole or 2% milk, cream, and half-and-half. Whole or full-fat cream cheese. Whole-fat or sweetened yogurt. Full-fat cheese. Nondairy creamers. Whipped toppings. Processed cheese and cheese spreads. Fats and oils  Butter. Stick margarine. Lard. Shortening. Ghee. Bacon fat. Tropical oils, such as coconut, palm kernel, or palm oil. Seasoning and other foods  Salted popcorn and pretzels. Onion salt, garlic salt, seasoned salt, table salt, and sea salt. Worcestershire sauce. Tartar sauce. Barbecue sauce. Teriyaki sauce. Soy sauce, including reduced-sodium. Steak sauce. Canned and packaged gravies. Fish sauce. Oyster sauce. Cocktail sauce. Horseradish that you find on the shelf. Ketchup. Mustard. Meat flavorings and tenderizers. Bouillon cubes. Hot sauce and Tabasco sauce. Premade or packaged marinades. Premade or packaged taco seasonings. Relishes. Regular salad dressings. Where to find more information:  National Heart, Lung, and [place]: https://wilson-eaton.com/  American Heart Association: www.heart.org Summary  The DASH eating plan is a healthy eating plan that has been shown to reduce high blood pressure (hypertension). It may also reduce  your risk for type 2 diabetes, heart disease, and stroke.  With the DASH eating plan, you should limit salt (sodium) intake to 2,300 mg a day. If you have hypertension, you may need to reduce your sodium intake to 1,500 mg a day.  When on the DASH eating plan, aim to eat more fresh fruits and vegetables, whole grains, lean proteins, low-fat dairy, and heart-healthy fats.  Work with your health care provider or diet and nutrition specialist (dietitian) to adjust your eating plan to your individual calorie  needs. This information is not intended to replace advice given to you by your health care provider. Make sure you discuss any questions you have with your health care provider. Document Released: 11/06/2011 Document Revised: 11/10/2016 Document Reviewed: 11/10/2016 Elsevier Interactive Patient Education  2017 [employer].

## 2017-01-30 NOTE — Assessment & Plan Note (Addendum)
Asymptomatic HTN, sometimes with SBP as high as 200s per her report. - increase lisinopril to 10mg  daily - continue amlodipine. - f/u in 2 weeks with me for BP check and labs - will attempt to get her a talking BP cuff so she can take her BPs by herself.

## 2017-01-30 NOTE — Assessment & Plan Note (Signed)
Following up with opthalmology in May, per her report healing well.

## 2017-01-30 NOTE — Assessment & Plan Note (Signed)
Resolved

## 2017-02-11 ENCOUNTER — Ambulatory Visit: Payer: Medicaid Other | Admitting: Physical Therapy

## 2017-02-12 ENCOUNTER — Encounter: Payer: Self-pay | Admitting: Physical Therapy

## 2017-02-12 ENCOUNTER — Ambulatory Visit: Payer: Medicaid Other | Attending: Family Medicine | Admitting: Physical Therapy

## 2017-02-12 DIAGNOSIS — M6281 Muscle weakness (generalized): Secondary | ICD-10-CM | POA: Diagnosis not present

## 2017-02-12 DIAGNOSIS — R279 Unspecified lack of coordination: Secondary | ICD-10-CM | POA: Diagnosis present

## 2017-02-12 NOTE — Patient Instructions (Addendum)
Certain foods and liquids will decrease the pH making the urine more acidic.  Urinary urgency increases when the urine has a low pH.  Most common irritants: alcohol, carbonated beverages and caffinated beverages.  Foods to avoid: apple juice, apples, ascorbic acid, canteloupes, chili, citrus fruits, coffee, cranberries, grapes, guava, peaches, pepper, pineapple, plums, strawberries, tea, tomatoes, and vinegar.  Drinking plenty of water may help to increase the pH and dilute out any of the effects of specific irritants.  Foods that are NOT irritating to the bladder include: Pears, papayas, sun-brewed teas, watermelons, non-citrus herbal teas, apricots, kava and low-acid instant drinks (Postum)  Adduction: Hip - Knees Together (Hook-Lying)    Lie with hips and knees bent, towel roll between knees. Push knees together. Hold for _3__ seconds. Rest for _10__ seconds. Repeat _10__ times. 5 quick flicks.  Do __3_ times a day. Initially hold 3 sec after 4 weeks increase by 2 sec until you get to 10 second.  Do not hold your breath.  Use towel to gently pull up pelvic floor. Do not contract the buttocks.  Can use finger in the vaginal canal to feel the muscle work.  Feel the tension in the lower abdominal area.  Copyright  VHI. All rights reserved.   When you get good at this exercise and place your finger in the vagina circular contraction and pull up .  You are ready for the exercise above in sitting.   [facility] [address], [place], [place] [address] Phone # [phone] Fax [phone] [email or web address]

## 2017-02-12 NOTE — Therapy (Signed)
[facility] [address] [place], [place], [address] Phone: [phone]   Fax:  [phone]  Physical Therapy Evaluation  Patient Details  Name: Sarah Kline MRN: [id number] Date of Birth: 13-Aug-1966 Referring Provider: Dr. Kathrine Cords  Encounter Date: 02/12/2017      PT End of Session - 02/12/17 1040    Visit Number 1   PT Start Time 0930   PT Stop Time 1040   PT Time Calculation (min) 70 min   Activity Tolerance Patient tolerated treatment well;Other (comment)  blind   Behavior During Therapy WFL for tasks assessed/performed      Past Medical History:  Diagnosis Date  . Diabetes mellitus   . Hypertension   . Sickle cell disease, type [facility])     Past Surgical History:  Procedure Laterality Date  . EYE SURGERY    . GANGLION CYST EXCISION     Left dorsal wrist    There were no vitals filed for this visit.       Subjective Assessment - 02/12/17 0941    Subjective Patient reports her urinary leakage is getting worse.  Leaks urine with laugh, cough, sneeze, scooting chair and getting in bed. When going out will wear a pad.  No work.    Pertinent History legally blind   Patient Stated Goals be educated on the issue   Currently in Pain? No/denies            [facility] PT Assessment - 02/12/17 0001      Assessment   Medical Diagnosis N39.3 Stress incontinence   Referring Provider Dr. Kathrine Cords   Onset Date/Surgical Date 08/01/16   Prior Therapy None     Precautions   Precautions Other (comment)   Precaution Comments legally blind     Restrictions   Weight Bearing Restrictions No     Balance Screen   Has the patient fallen in the past 6 months No   Has the patient had a decrease in activity level because of a fear of falling?  No   Is the patient reluctant to leave their home because of a fear of falling?  No     Home Ecologist residence     Prior Function   Level of Independence Other (comment)  leagally blind therefore needs assistance   Vocation Other (comment)     Cognition   Overall Cognitive Status Within Functional Limits for tasks assessed     Observation/Other Assessments   Focus on Therapeutic Outcomes (FOTO)  therapist discrection is limited by 50% due to incontinence and blindness     ROM / Strength   AROM / PROM / Strength Strength;PROM     PROM   Overall PROM Comments bil. hip flexion 90 degrees, bil. hip rotation decreased by 25%     Strength   Overall Strength Within functional limits for tasks performed     Palpation   SI assessment  pelvis in correct alignment                 Pelvic Floor Special Questions - 02/12/17 0001    Currently Sexually Active Yes  leaks urine with sex   Is this Painful No   Urinary Leakage Yes   Pad use 1  when go out   Activities that cause leaking Coughing;Sneezing;With strong urge;Laughing;Lifting;Other  sit to stand   Urinary urgency Yes   Urinary frequency --  does not fully empty her  bladder   Skin Integrity Intact  patient on her cycle   Pelvic Floor Internal Exam Patient confirms identification and approves PT to assess    Exam Type Vaginal   Palpation patient needed many tactile cues to understand how to perform a pelvic floor contraction due to level of weakness, she needed tapping and quick stretch of the pelvic floor muscles   Strength Flicker                  PT Education - 02/12/17 1037    Education provided Yes   Education Details education on bladder irritants, pelvic floor contraction with facilitation and how to progress   Person(s) Educated Patient   Methods Explanation;Demonstration;Verbal cues;Handout;Tactile cues   Comprehension Verbalized understanding;Returned demonstration;Need further instruction;Other (comment)  patient is blind             PT Long Term Goals - 02/12/17 1046      PT LONG TERM GOAL #1    Title understand correct pelvic floor contraction and ways to check at home if she is doing it right   Time 1   Period Days   Status Achieved     PT LONG TERM GOAL #2   Title understand what bladder irritants are and howt they affect the bladder   Time 1   Period Days   Status Achieved     PT LONG TERM GOAL #3   Title understand how to progress her pelvic floor exericses as she gets stronger   Time 1   Period Days   Status Achieved               Plan - 02/12/17 1040    Clinical Impression Statement Patient is a 51 year old blind female with stress incontinence.  Patient reports no pain.  Patient leaks urine with coughing, laughing, activity, sneezing and when she has the urge.  Pelvic floor sterngth is 1/5 and has trouble sensing she is contracting her pelvic floor.  She needs muscle tapping and hip adductors to work on the contraction.  Patient was on her cycle during the evaluation.  Patient was able to return demonstration and her husband can read the exercises to her.  Patient understands what bladder irritants are and how they can increase urinary leakage. Patient is moderatly complex evaluation due to an evolving condition and comforbidities such as blindness, diabetes, and difficulty sensing a pelvic floor contraction that can impact her care.    Rehab Potential Good   Clinical Impairments Affecting Rehab Potential blind    PT Frequency One time visit   PT Duration Other (comment)  1 time visit for initial eval and discharge   PT Treatment/Interventions Therapeutic activities;Therapeutic exercise;Patient/family education   PT Next Visit Plan Discharge from physical therapy with HEP due to 1 time visit   PT Home Exercise Plan Current HEP   Recommended Other Services In the future if her insurance changes then come for more therapy   Consulted and Agree with Plan of Care Patient      Patient will benefit from skilled therapeutic intervention in order to improve the  following deficits and impairments:  Decreased strength  Visit Diagnosis: Muscle weakness (generalized) - Plan: PT plan of care cert/re-cert  Unspecified lack of coordination - Plan: PT plan of care cert/re-cert     Problem List Patient Active Problem List   Diagnosis Date Noted  . Corneal abrasion, left 01/20/2017  . TIA (transient ischemic attack) 01/19/2017  . Accelerated hypertension  01/19/2017  . Left eye pain 01/19/2017  . Urinary incontinence 01/19/2017  . Legally blind 11/12/2016  . Mass 11/12/2016  . Diabetes mellitus ([place]) 11/12/2012  . HTN (hypertension) 11/12/2012  . 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, [place], [place], [address] Phone: [phone]   Fax:  [phone]  Name: Sarah Kline MRN: [id number] Date of Birth: 27-Dec-1965

## 2017-02-13 ENCOUNTER — Encounter: Payer: Self-pay | Admitting: Family Medicine

## 2017-02-13 ENCOUNTER — Ambulatory Visit (INDEPENDENT_AMBULATORY_CARE_PROVIDER_SITE_OTHER): Payer: Medicaid Other | Admitting: Family Medicine

## 2017-02-13 VITALS — BP 166/94 | HR 79 | Temp 98.5°F | Ht 64.0 in | Wt 214.0 lb

## 2017-02-13 DIAGNOSIS — I10 Essential (primary) hypertension: Secondary | ICD-10-CM

## 2017-02-13 DIAGNOSIS — IMO0002 Reserved for concepts with insufficient information to code with codable children: Secondary | ICD-10-CM

## 2017-02-13 DIAGNOSIS — R229 Localized swelling, mass and lump, unspecified: Secondary | ICD-10-CM

## 2017-02-13 DIAGNOSIS — G459 Transient cerebral ischemic attack, unspecified: Secondary | ICD-10-CM | POA: Diagnosis not present

## 2017-02-13 LAB — BASIC METABOLIC PANEL WITH GFR
BUN: 25 mg/dL (ref 7–25)
CHLORIDE: 110 mmol/L (ref 98–110)
CO2: 22 mmol/L (ref 20–31)
CREATININE: 1.73 mg/dL — AB (ref 0.50–1.05)
Calcium: 9 mg/dL (ref 8.6–10.4)
GFR, Est African American: 39 mL/min — ABNORMAL LOW (ref >=60)
GFR, Est Non African American: 33 mL/min — ABNORMAL LOW (ref >=60)
Glucose, Bld: 113 mg/dL — ABNORMAL HIGH (ref 65–99)
POTASSIUM: 4.8 mmol/L (ref 3.5–5.3)
SODIUM: 139 mmol/L (ref 135–146)

## 2017-02-13 MED ORDER — LISINOPRIL 10 MG PO TABS: 10.0000 mg | ORAL_TABLET | Freq: Every day | ORAL | 0 refills | Status: DC

## 2017-02-13 MED ORDER — AMLODIPINE BESYLATE 10 MG PO TABS: 10.0000 mg | ORAL_TABLET | Freq: Every day | ORAL | 0 refills | Status: DC

## 2017-02-13 NOTE — Patient Instructions (Signed)
It was nice to see you again. We are working on getting you the talking blood sugar monitor. Increase amlodipine to 10 mg daily. I have sent in a prescription. You can take 2 of amlodipine 5 mg until you run out of that bottle. Continue with lisinopril 10 mg daily Follow-up with me in one month or sooner as needed.  Use heat on the nodule daily. It is starting to bother you more, or you feel like the area is enlarging, let me know and we can place an order for an ultrasound. This would be something that surgery would ultimately take care of.

## 2017-02-13 NOTE — Assessment & Plan Note (Signed)
This deep subcutaneous mass/nodule appears to be stable in size versus a little smaller on my measurement today. No red flags. No fevers, chills, or weight loss. -Discussed that we could proceed with an ultrasound to further characterize this now versus in the future if there's no improvement. Patient would like to wait continue using heat, except this time daily, to see if this improves the mass. -Ultimately, if the patient does wish to have this removed, this would be a surgical consult rather than something performed in the [facility] clinic.

## 2017-02-13 NOTE — Progress Notes (Signed)
Subjective: CC: f/u BP HPI: Patient is a 51 y.o. female with a past medical history of hypertension, diabetes, VI nerve palsy, legally blind presenting to clinic today for f/u BP  Hypertension Blood pressure at home: hasn't checked recently Meds: Compliant with amlodipine 5mg  and lisinopril 10mg  (recently increased) Side effects: none  ROS: Denies headache, dizziness, visual changes, nausea, vomiting, chest pain, abdominal pain or shortness of breath.  Back: Back in December, she was noted to have a small mass around her left thoracic region. She states that it is intermittently painful. She does not feel it has enlarged in size. She has intermittently been putting heat on the area which seems to help some. No fevers or chills.  Social History: married  ROS: All other systems reviewed and are negative besides above and urinary incontinence for which she saw pelvic PT yesterday.   Past Medical History Patient Active Problem List   Diagnosis Date Noted  . Corneal abrasion, left 01/20/2017  . TIA (transient ischemic attack) 01/19/2017  . Accelerated hypertension 01/19/2017  . Left eye pain 01/19/2017  . Urinary incontinence 01/19/2017  . Legally blind 11/12/2016  . Mass 11/12/2016  . Diabetes mellitus ([place]) 11/12/2012  . HTN (hypertension) 11/12/2012  . 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 amLODipine (NORVASC) 10 MG tablet Take 1 tablet (10 mg total) by mouth daily. 90 tablet 0  . aspirin 325 MG tablet Take 1 tablet (325 mg total) by mouth daily. 30 tablet 0  . atorvastatin (LIPITOR) 80 MG tablet Take 1 tablet (80 mg total) by mouth daily at 6 PM. 30 tablet 0  . erythromycin ophthalmic ointment Place into the left eye every 6 (six) hours. 3.5 g 0  . lisinopril (PRINIVIL,ZESTRIL) 10 MG tablet Take 1 tablet (10 mg total) by mouth daily. 90 tablet 0  . metFORMIN (GLUCOPHAGE) 500 MG  tablet Take 2 tablets (1,000 mg total) by mouth 2 (two) times daily with a meal. (Patient taking differently: Take 500 mg by mouth 2 (two) times daily. ) 120 tablet 3  . traMADol (ULTRAM) 50 MG tablet Take or or two tabs by mouth  at beginning of headache and may repeat every 8 hours max 6 tabs a day (Patient taking differently: Take 50-100 mg by mouth See admin instructions. At the beginning of a headache and may repeat every 8 hours if needed (max 6 tabs a day)) 45 tablet 1   No current facility-administered medications for this visit.     Objective: Office vital signs reviewed. BP (!) 166/94   Pulse 79   Temp 98.5 F (36.9 C) (Oral)   Ht 5\' 4"  (1.626 m)   Wt 214 lb (97.1 kg)   BMI 36.73 kg/m    Physical Examination:  General: Awake, alert, well- nourished, NAD Cardio: RRR, no m/r/g noted. No pitting edema Pulm: No increased WOB.  CTAB, without wheezes, rhonchi or crackles noted. Ext: no pitting edema Back: 2.5x1cm slightly mobile deep subcutaneous mass on the lateral throacic region on the R that is slightly tender.   Assessment/Plan: HTN (hypertension) Blood pressure is still not at goal on repeat. She is asymptomatic. -BMP today given increase in lisinopril 2 weeks ago -Increase amlodipine from 5-10 mg daily -Patient follow-up with me in one month or sooner as necessary.   Mass This deep subcutaneous mass/nodule appears to be stable in size versus a little smaller on my measurement  today. No red flags. No fevers, chills, or weight loss. -Discussed that we could proceed with an ultrasound to further characterize this now versus in the future if there's no improvement. Patient would like to wait continue using heat, except this time daily, to see if this improves the mass. -Ultimately, if the patient does wish to have this removed, this would be a surgical consult rather than something performed in the [facility] clinic.   Orders Placed This Encounter  Procedures  . BASIC  METABOLIC PANEL WITH GFR    Meds ordered this encounter  Medications  . amLODipine (NORVASC) 10 MG tablet    Sig: Take 1 tablet (10 mg total) by mouth daily.    Dispense:  90 tablet    Refill:  0  . lisinopril (PRINIVIL,ZESTRIL) 10 MG tablet    Sig: Take 1 tablet (10 mg total) by mouth daily.    Dispense:  90 tablet    Refill:  [place] PGY-3, [place]

## 2017-02-13 NOTE — Assessment & Plan Note (Signed)
Blood pressure is still not at goal on repeat. She is asymptomatic. -BMP today given increase in lisinopril 2 weeks ago -Increase amlodipine from 5-10 mg daily -Patient follow-up with me in one month or sooner as necessary.

## 2017-02-18 ENCOUNTER — Encounter: Payer: Self-pay | Admitting: Family Medicine

## 2017-03-16 ENCOUNTER — Ambulatory Visit (INDEPENDENT_AMBULATORY_CARE_PROVIDER_SITE_OTHER): Payer: Medicaid Other | Admitting: Family Medicine

## 2017-03-16 ENCOUNTER — Encounter: Payer: Self-pay | Admitting: Family Medicine

## 2017-03-16 DIAGNOSIS — R011 Cardiac murmur, unspecified: Secondary | ICD-10-CM | POA: Insufficient documentation

## 2017-03-16 DIAGNOSIS — I10 Essential (primary) hypertension: Secondary | ICD-10-CM | POA: Diagnosis not present

## 2017-03-16 MED ORDER — HYDROCHLOROTHIAZIDE 12.5 MG PO TABS: 12.5000 mg | ORAL_TABLET | Freq: Every day | ORAL | 0 refills | Status: DC

## 2017-03-16 NOTE — Progress Notes (Signed)
Subjective: CC: f/u BP HPI: Patient is a 51 y.o. female with a past medical history of hypertension, diabetes, VI nerve palsy, legally blind presenting to clinic today for f/u BP  Hypertension Blood pressure at home: hasn't checked recently Meds: Compliant with lisinopril 10mg  and amlodipine 10mg  (recently increased 1 month ago). Took all meds this AM. Side effects: none  ROS: Denies headache, dizziness, visual changes, nausea, vomiting, chest pain, abdominal pain or shortness of breath.   Social History: married  ROS: All other systems reviewed and are negative besides shuffling back and falling twice in the last month. This is normally right after standing. No dizziness, light headedness, warmth, palpitations, vision changes before hand. She fell on her buttocks both times without injury. No imbalance. No neurologic deficits.    Past Medical History Patient Active Problem List   Diagnosis Date Noted  . Systolic murmur 34/19/6222  . Corneal abrasion, left 01/20/2017  . TIA (transient ischemic attack) 01/19/2017  . Accelerated hypertension 01/19/2017  . Left eye pain 01/19/2017  . Urinary incontinence 01/19/2017  . Legally blind 11/12/2016  . Mass 11/12/2016  . Diabetes mellitus ([place]) 11/12/2012  . HTN (hypertension) 11/12/2012  . 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 amLODipine (NORVASC) 10 MG tablet Take 1 tablet (10 mg total) by mouth daily. 90 tablet 0  . aspirin 325 MG tablet Take 1 tablet (325 mg total) by mouth daily. 30 tablet 0  . atorvastatin (LIPITOR) 80 MG tablet Take 1 tablet (80 mg total) by mouth daily at 6 PM. 30 tablet 0  . erythromycin ophthalmic ointment Place into the left eye every 6 (six) hours. 3.5 g 0  . hydrochlorothiazide (HYDRODIURIL) 12.5 MG tablet Take 1 tablet (12.5 mg total) by mouth daily. 30 tablet 0  . lisinopril (PRINIVIL,ZESTRIL) 10 MG tablet  Take 1 tablet (10 mg total) by mouth daily. 90 tablet 0  . metFORMIN (GLUCOPHAGE) 500 MG tablet Take 2 tablets (1,000 mg total) by mouth 2 (two) times daily with a meal. (Patient taking differently: Take 500 mg by mouth 2 (two) times daily. ) 120 tablet 3  . traMADol (ULTRAM) 50 MG tablet Take or or two tabs by mouth  at beginning of headache and may repeat every 8 hours max 6 tabs a day (Patient taking differently: Take 50-100 mg by mouth See admin instructions. At the beginning of a headache and may repeat every 8 hours if needed (max 6 tabs a day)) 45 tablet 1   No current facility-administered medications for this visit.     Objective: Office vital signs reviewed. BP (!) 152/92   Pulse 77   Temp 98.9 F (37.2 C) (Oral)   Ht 5\' 4"  (1.626 m)   Wt 216 lb (98 kg)   BMI 37.08 kg/m    Physical Examination:  General: Awake, alert, well- nourished, NAD Cardio: RRR, III/VI systolic murmur No pitting edema Pulm: No increased WOB.  CTAB, without wheezes, rhonchi or crackles noted. Ext: no pitting edema A&O x4. Speech clear. Uvula and tongue midline. Facial movements symmetric. 5/5 strength in the upper extremities and lower extremities bilaterally. Sensation intact bilaterally. Normal DTRs. Gait normal.   Assessment/Plan: Diabetes mellitus ([place]) Off metformin.  Will check A1c in late May. Continue to discuss life style modifications. Needs foot exam at next visit.   HTN (hypertension) BP still not at goal. Continue lisinopril and amlodipine Add HCTZ 12.5mg  daily.  Pt to f/u in 4 weeks  Falls No injury note. Neurologic exam normal. Pt to f/u is symptoms become more recurrent or new symptoms occur.  No orders of the defined types were placed in this encounter.   Meds ordered this encounter  Medications  . hydrochlorothiazide (HYDRODIURIL) 12.5 MG tablet    Sig: Take 1 tablet (12.5 mg total) by mouth daily.    Dispense:  30 tablet    Refill:  [place] [facility] PGY-3, [place]

## 2017-03-16 NOTE — Patient Instructions (Signed)
Continue to hold metformin. I have prescribed hydrochlorothiazide for your blood pressure.  We will recheck you A1c and your blood pressure in 1 month.

## 2017-03-16 NOTE — Assessment & Plan Note (Signed)
Off metformin.  Will check A1c in late May. Continue to discuss life style modifications. Needs foot exam at next visit.

## 2017-03-16 NOTE — Assessment & Plan Note (Signed)
BP still not at goal. Continue lisinopril and amlodipine Add HCTZ 12.5mg  daily. Pt to f/u in 4 weeks

## 2017-03-31 ENCOUNTER — Other Ambulatory Visit: Payer: Self-pay | Admitting: Family Medicine

## 2017-04-15 ENCOUNTER — Other Ambulatory Visit: Payer: Self-pay | Admitting: Family Medicine

## 2017-04-15 DIAGNOSIS — I10 Essential (primary) hypertension: Secondary | ICD-10-CM

## 2017-04-16 NOTE — Telephone Encounter (Signed)
Refilled BP medications for 1 month supply, pls have pt make an appt in our clinic.  Thanks, Archie Patten, MD [facility] Family Medicine Resident  04/16/2017, 11:55 AM

## 2017-04-16 NOTE — Telephone Encounter (Signed)
Patient has appointment already scheduled for 5/23 with PCP.

## 2017-04-22 ENCOUNTER — Ambulatory Visit (INDEPENDENT_AMBULATORY_CARE_PROVIDER_SITE_OTHER): Payer: Medicaid Other | Admitting: Family Medicine

## 2017-04-22 ENCOUNTER — Encounter: Payer: Self-pay | Admitting: Family Medicine

## 2017-04-22 VITALS — BP 138/72 | HR 81 | Temp 98.4°F | Ht 64.0 in | Wt 212.6 lb

## 2017-04-22 DIAGNOSIS — I10 Essential (primary) hypertension: Secondary | ICD-10-CM

## 2017-04-22 DIAGNOSIS — E1122 Type 2 diabetes mellitus with diabetic chronic kidney disease: Secondary | ICD-10-CM

## 2017-04-22 LAB — POCT GLYCOSYLATED HEMOGLOBIN (HGB A1C): Hemoglobin A1C: 7.3

## 2017-04-22 NOTE — Assessment & Plan Note (Signed)
Patient had a trial off metformin, unfortunately A1c went up to 7.3. - will restart metformin - on an ACE inhibitor  - on ASA - getting regular exams by ophtho, asked for those records - foot exam done today and normal; however pt would like L great toenail removed and treatment to try to prevent regrowth in the future

## 2017-04-22 NOTE — Progress Notes (Signed)
Subjective: CC: f/u BP HPI: Patient is a 51 y.o. female with a past medical history of hypertension, diabetes, VI nerve palsy, legally blind presenting to clinic today for f/u BP  Hypertension Blood pressure at home: hasn't checked recently Meds: Compliant with lisinopril 10mg , HCTZ and amlodipine 10mg . Took all meds this AM. Side effects: none  ROS: Denies headache, dizziness, visual changes, nausea, vomiting, chest pain, abdominal pain or shortness of breath.  Diabetes:  CBGs at home: doesn't check Taking medications:  Off metformin as last A1c was at goal  Side effects: n/a ROS: denies fever, chills, dizziness, LOC, polyuria, polydipsia, numbness or tingling in extremities or chest pain. Last eye exam: sees eye doctor regularly Last foot exam:  Last A1c: 5.7  Nephropathy screen indicated?: no Last flu, zoster and/or pneumovax: up to date  Social History: married  ROS: All other systems reviewed and are negative.  Past Medical History Patient Active Problem List   Diagnosis Date Noted  . Systolic murmur 41/93/7902  . Corneal abrasion, left 01/20/2017  . TIA (transient ischemic attack) 01/19/2017  . Accelerated hypertension 01/19/2017  . Left eye pain 01/19/2017  . Urinary incontinence 01/19/2017  . Legally blind 11/12/2016  . Mass 11/12/2016  . Diabetes mellitus ([place]) 11/12/2012  . HTN (hypertension) 11/12/2012  . 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 amLODipine (NORVASC) 10 MG tablet TAKE 1 TABLET BY MOUTH EVERY DAY 90 tablet -0  . aspirin 325 MG tablet Take 1 tablet (325 mg total) by mouth daily. 30 tablet 0  . atorvastatin (LIPITOR) 80 MG tablet Take 1 tablet by mouth daily at 6pm 30 tablet 2  . erythromycin ophthalmic ointment Place into the left eye every 6 (six) hours. 3.5 g 0  . hydrochlorothiazide (HYDRODIURIL) 12.5 MG tablet TAKE 1 TABLET BY MOUTH EVERY DAY  30 tablet 0  . lisinopril (PRINIVIL,ZESTRIL) 10 MG tablet Take 1 tablet (10 mg total) by mouth daily. 90 tablet 0  . metFORMIN (GLUCOPHAGE) 500 MG tablet Take 2 tablets (1,000 mg total) by mouth 2 (two) times daily with a meal. (Patient taking differently: Take 500 mg by mouth 2 (two) times daily. ) 120 tablet 3  . traMADol (ULTRAM) 50 MG tablet Take or or two tabs by mouth  at beginning of headache and may repeat every 8 hours max 6 tabs a day (Patient taking differently: Take 50-100 mg by mouth See admin instructions. At the beginning of a headache and may repeat every 8 hours if needed (max 6 tabs a day)) 45 tablet 1   No current facility-administered medications for this visit.     Objective: Office vital signs reviewed. BP 138/72   Pulse 81   Temp 98.4 F (36.9 C) (Oral)   Ht 5\' 4"  (1.626 m)   Wt 212 lb 9.6 oz (96.4 kg)   SpO2 97%   BMI 36.49 kg/m    Physical Examination:  General: Awake, alert, well- nourished, NAD Cardio: RRR, III/VI systolic murmur No pitting edema Pulm: No increased WOB.  CTAB, without wheezes, rhonchi or crackles noted. Ext: no pitting edema. Left great toenail black/blue, very thin and abnormally shaped, no evidence of infection and no pain. Normal monofilament exam  A1c 7.3  Assessment/Plan: Diabetes mellitus ([place]) Patient had a trial off metformin, unfortunately A1c went up to 7.3. - will restart metformin - on an ACE inhibitor  - on ASA - getting regular exams  by ophtho, asked for those records - foot exam done today and normal; however pt would like L great toenail removed and treatment to try to prevent regrowth in the future  HTN (hypertension) BP well controlled. - continue current regimen - will check BMET   Orders Placed This Encounter  Procedures  . Basic metabolic panel  . HgB A1c    No orders of the defined types were placed in this encounter.   Archie Patten PGY-3, [place]

## 2017-04-22 NOTE — Patient Instructions (Signed)
I will call you with an appointment time for the toenail removal.  Continue with the current regimen.  Start taking metformin again.  Follow up in 3 months for your blood pressure.

## 2017-04-22 NOTE — Assessment & Plan Note (Signed)
BP well controlled. - continue current regimen - will check BMET

## 2017-04-23 LAB — BASIC METABOLIC PANEL
BUN / CREAT RATIO: 18 (ref 9–23)
BUN: 37 mg/dL — AB (ref 6–24)
CO2: 19 mmol/L (ref 18–29)
CREATININE: 2.05 mg/dL — AB (ref 0.57–1.00)
Calcium: 9.2 mg/dL (ref 8.7–10.2)
Chloride: 107 mmol/L — ABNORMAL HIGH (ref 96–106)
GFR calc non Af Amer: 27 mL/min/{1.73_m2} — ABNORMAL LOW (ref >59)
GFR, EST AFRICAN AMERICAN: 31 mL/min/{1.73_m2} — AB (ref >59)
GLUCOSE: 108 mg/dL — AB (ref 65–99)
Potassium: 5.1 mmol/L (ref 3.5–5.2)
SODIUM: 137 mmol/L (ref 134–144)

## 2017-05-01 ENCOUNTER — Telehealth: Payer: Self-pay | Admitting: Family Medicine

## 2017-05-01 MED ORDER — GLIPIZIDE 5 MG PO TABS: 2.5000 mg | ORAL_TABLET | Freq: Every day | ORAL | 0 refills | Status: DC

## 2017-05-01 NOTE — Telephone Encounter (Signed)
Patient's renal function noted to be worsening.   Will d/c metformin.  Has taken glipizide in the past without issues (despite allergy to Bactrim). Discussed they both have a sulfa component.   Will start glipizide 2.5mg  daily. Pt to f/u in clinic in 2 weeks to see how her CBGs are doing and f/u on SCr.  Archie Patten, MD [facility] Family Medicine Resident  05/01/2017, 4:01 PM

## 2017-05-01 NOTE — Telephone Encounter (Signed)
Attempted to call patient, no answer.   Given worsening renal function, she should discontinue metformin.  She will need to f/u in 2-4 weeks for repeat testing.  I will attempt to call her again.  Archie Patten, MD [facility] Resident

## 2017-05-13 ENCOUNTER — Other Ambulatory Visit: Payer: Self-pay | Admitting: Family Medicine

## 2017-09-25 ENCOUNTER — Encounter (HOSPITAL_COMMUNITY): Payer: Self-pay | Admitting: *Deleted

## 2017-09-25 ENCOUNTER — Ambulatory Visit (HOSPITAL_COMMUNITY)
Admission: EM | Admit: 2017-09-25 | Discharge: 2017-09-25 | Disposition: A | Discharge status: Home / Self Care | Payer: Medicaid Other | Attending: Emergency Medicine | Admitting: Emergency Medicine

## 2017-09-25 DIAGNOSIS — S39012A Strain of muscle, fascia and tendon of lower back, initial encounter: Secondary | ICD-10-CM | POA: Diagnosis not present

## 2017-09-25 DIAGNOSIS — S161XXA Strain of muscle, fascia and tendon at neck level, initial encounter: Secondary | ICD-10-CM | POA: Diagnosis not present

## 2017-09-25 HISTORY — DX: Unspecified visual loss: H54.7

## 2017-09-25 NOTE — Discharge Instructions (Signed)
Heat to the muscles.Recommend starting with heat to the areas of soreness. Start with dental stretches and range of motion movements to help with neck pain and stiffness. Remember that she will be sore more tomorrow than today most likely. Ibuprofen 800 mg every 8 hours as needed.
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
## 2017-10-01 NOTE — Progress Notes (Signed)
Subjective:    Patient ID: Sarah Kline, female    DOB: 07-15-1966, 51 y.o.   MRN: [id number]  CC: Meet new PCP, HTN, DM, Constipation   HPI: Meet new PCP Patient presents today to establish care. Patient has never been seen in clinic by me and needs refills on all her medications, has been out of medications x2 months.  Health maintenance concerns discussed. Mammogram obtained this am, patient requested breast center transfer records to PCP. Patient in need of colonoscopy.   Hypertension Blood pressure at home: hasn't checked recently Meds: has not taken in 2 mo. Prescribed lisinopril 10mg , HCTZ and amlodipine 10mg .  ROS: +headache but associated with stress, -dizziness, -nausea, -vomiting, +chest pain when laying down on right side sporadic feels hard to breath last time was 1 week ago, sitting up helps, not SOB, no hx of blood clots, -abdominal pain or shortness of breath.  Constipated Chronic problem, which has occurred for years. Patient states she has BM maybe once a week but not often. Takes generic senna with some releif.    Diabetes CBGs at home: doesn't check Diet: husband now cooks all her food Exercise: none  Medications: has not taken medications x 42mo. Prescribed glipizide 5 mg   Side effects: low blood sugar, shaky, weak  ROS: denies fever, chills, dizziness, LOC, polyuria, polydipsia, numbness or tingling in extremities or chest pain. Last eye exam: sees eye doctor regularly, last appointment 09/22/2017  Last foot exam: 04/28/2017 Last A1c: 6.1 on 10/02/17. POC glucose of 94.  Last flu, zoster and/or pneumovax: will get flu shot today, pneumovax on 11/12/16  Objective:  BP (!) 166/80   Pulse 82   Temp 98.5 F (36.9 C) (Oral)   Wt 229 lb (103.9 kg)   LMP 08/01/2017   SpO2 99%   BMI 39.31 kg/m  Vitals and nursing note reviewed  General: well nourished, in no acute distress Cardiac: RRR, clear S1 and S2, no murmurs, rubs, or gallops Respiratory: clear  to auscultation bilaterally, no increased work of breathing Abdomen: soft, nontender, nondistended, no masses or organomegaly. Bowel sounds present Skin: warm and dry, no rashes noted   Assessment & Plan:    Healthcare maintenance -will obtain records or mammogram -will request records from opthalmology  -have given patient form on scheduling colonoscopy. Patient verbalized that she will schedule.  -encouraged consistent follow up with PCP for medication management  -advised patient that she should not go 2 months without medications and can call clinic for refill requests as needed if unable to make an appointment  -ASA decreased to 81mg  -refilled atorvastatin  Constipation -prescribed miralax daily -advised to take senna once a week -continue to monitor -follow up as needed   HTN (hypertension) Poorly controlled, BP of 166/80 today. Likely 2/2 medication non-compliance. Stressed importance of taking medications. EKG wnl.  -discontinue lisinopril until repeat BMP results given previous elevated Cr. If Cr at normal level can consider restarting -follow up in 1 week for nursing visit for BP check -follow up in 1 month for MD BP check -have refilled amlodipine and HCTZ -have asked Laurenze Ducatte for more information regarding meter that can vocalize readings   Diabetes mellitus ([place]) A1C 6.1 with POC glucose 94. Patient stated only change is that husband now cooks all her food. Encouraged healthy eating and daily exercise.  -discontinued all medications as she is diet controlled  -will continue to monitor  -have asked Laurenze Ducatte for more information regarding meter that can vocalize  readings    Return in about 1 week (around 10/09/2017) for Nursing BP check. And follow up in 1 month for BP f/u.   Caroline More, DO, PGY-1

## 2017-10-02 ENCOUNTER — Ambulatory Visit (INDEPENDENT_AMBULATORY_CARE_PROVIDER_SITE_OTHER): Payer: Medicaid Other | Admitting: Family Medicine

## 2017-10-02 ENCOUNTER — Encounter: Payer: Self-pay | Admitting: Family Medicine

## 2017-10-02 ENCOUNTER — Ambulatory Visit (HOSPITAL_COMMUNITY)
Admission: RE | Admit: 2017-10-02 | Discharge: 2017-10-02 | Disposition: A | Discharge status: Home / Self Care | Payer: Medicaid Other | Source: Ambulatory Visit | Attending: Family Medicine | Admitting: Family Medicine

## 2017-10-02 VITALS — BP 166/80 | HR 82 | Temp 98.5°F | Wt 229.0 lb

## 2017-10-02 DIAGNOSIS — I10 Essential (primary) hypertension: Secondary | ICD-10-CM | POA: Diagnosis not present

## 2017-10-02 DIAGNOSIS — K5900 Constipation, unspecified: Secondary | ICD-10-CM

## 2017-10-02 DIAGNOSIS — E1122 Type 2 diabetes mellitus with diabetic chronic kidney disease: Secondary | ICD-10-CM

## 2017-10-02 DIAGNOSIS — Z23 Encounter for immunization: Secondary | ICD-10-CM

## 2017-10-02 DIAGNOSIS — Z0000 Encounter for general adult medical examination without abnormal findings: Secondary | ICD-10-CM

## 2017-10-02 LAB — GLUCOSE, POCT (MANUAL RESULT ENTRY): POC GLUCOSE: 94 mg/dL (ref 70–99)

## 2017-10-02 LAB — POCT GLYCOSYLATED HEMOGLOBIN (HGB A1C): Hemoglobin A1C: 6.1

## 2017-10-02 MED ORDER — HYDROCHLOROTHIAZIDE 12.5 MG PO TABS: 12.5000 mg | ORAL_TABLET | Freq: Every day | ORAL | 2 refills | Status: DC

## 2017-10-02 MED ORDER — ATORVASTATIN CALCIUM 80 MG PO TABS: ORAL_TABLET | ORAL | 2 refills | Status: DC

## 2017-10-02 MED ORDER — ASPIRIN 81 MG PO TABS: 81.0000 mg | ORAL_TABLET | Freq: Every day | ORAL | 2 refills | Status: DC

## 2017-10-02 MED ORDER — AMLODIPINE BESYLATE 10 MG PO TABS: 10.0000 mg | ORAL_TABLET | Freq: Every day | ORAL | 2 refills | Status: DC

## 2017-10-02 MED ORDER — POLYETHYLENE GLYCOL 3350 17 GM/SCOOP PO POWD: 17.0000 g | Freq: Every day | ORAL | 1 refills | Status: DC

## 2017-10-02 NOTE — Assessment & Plan Note (Addendum)
-  will obtain records or mammogram -will request records from opthalmology  -have given patient form on scheduling colonoscopy. Patient verbalized that she will schedule.  -encouraged consistent follow up with PCP for medication management  -advised patient that she should not go 2 months without medications and can call clinic for refill requests as needed if unable to make an appointment  -ASA decreased to 81mg  -refilled atorvastatin

## 2017-10-02 NOTE — Assessment & Plan Note (Addendum)
A1C 6.1 with POC glucose 94. Patient stated only change is that husband now cooks all her food. Encouraged healthy eating and daily exercise.  -discontinued all medications as she is diet controlled  -will continue to monitor  -have asked Laurenze Ducatte for more information regarding meter that can vocalize readings

## 2017-10-02 NOTE — Assessment & Plan Note (Addendum)
Poorly controlled, BP of 166/80 today. Likely 2/2 medication non-compliance. Stressed importance of taking medications. EKG wnl.  -discontinue lisinopril until repeat BMP results given previous elevated Cr. If Cr at normal level can consider restarting -follow up in 1 week for nursing visit for BP check -follow up in 1 month for MD BP check -have refilled amlodipine and HCTZ -have asked Laurenze Ducatte for more information regarding meter that can vocalize readings

## 2017-10-02 NOTE — Patient Instructions (Signed)
Hypertension Hypertension is another name for high blood pressure. High blood pressure forces your heart to work harder to pump blood. This can cause problems over time. There are two numbers in a blood pressure reading. There is a top number (systolic) over a bottom number (diastolic). It is best to have a blood pressure below 120/80. Healthy choices can help lower your blood pressure. You may need medicine to help lower your blood pressure if:  Your blood pressure cannot be lowered with healthy choices.  Your blood pressure is higher than 130/80.  Follow these instructions at home: Eating and drinking  If directed, follow the DASH eating plan. This diet includes: ? Filling half of your plate at each meal with fruits and vegetables. ? Filling one quarter of your plate at each meal with whole grains. Whole grains include whole wheat pasta, brown rice, and whole grain bread. ? Eating or drinking low-fat dairy products, such as skim milk or low-fat yogurt. ? Filling one quarter of your plate at each meal with low-fat (lean) proteins. Low-fat proteins include fish, skinless chicken, eggs, beans, and tofu. ? Avoiding fatty meat, cured and processed meat, or chicken with skin. ? Avoiding premade or processed food.  Eat less than 1,500 mg of salt (sodium) a day.  Limit alcohol use to no more than 1 drink a day for nonpregnant women and 2 drinks a day for men. One drink equals 12 oz of beer, 5 oz of wine, or 1 oz of hard liquor. Lifestyle  Work with your doctor to stay at a healthy weight or to lose weight. Ask your doctor what the best weight is for you.  Get at least 30 minutes of exercise that causes your heart to beat faster (aerobic exercise) most days of the week. This may include walking, swimming, or biking.  Get at least 30 minutes of exercise that strengthens your muscles (resistance exercise) at least 3 days a week. This may include lifting weights or pilates.  Do not use any  products that contain nicotine or tobacco. This includes cigarettes and e-cigarettes. If you need help quitting, ask your doctor.  Check your blood pressure at home as told by your doctor.  Keep all follow-up visits as told by your doctor. This is important. Medicines  Take over-the-counter and prescription medicines only as told by your doctor. Follow directions carefully.  Do not skip doses of blood pressure medicine. The medicine does not work as well if you skip doses. Skipping doses also puts you at risk for problems.  Ask your doctor about side effects or reactions to medicines that you should watch for. Contact a doctor if:  You think you are having a reaction to the medicine you are taking.  You have headaches that keep coming back (recurring).  You feel dizzy.  You have swelling in your ankles.  You have trouble with your vision. Get help right away if:  You get a very bad headache.  You start to feel confused.  You feel weak or numb.  You feel faint.  You get very bad pain in your: ? Chest. ? Belly (abdomen).  You throw up (vomit) more than once.  You have trouble breathing. Summary  Hypertension is another name for high blood pressure.  Making healthy choices can help lower blood pressure. If your blood pressure cannot be controlled with healthy choices, you may need to take medicine. This information is not intended to replace advice given to you by your health care   provider. Make sure you discuss any questions you have with your health care provider. Document Released: 05/05/2008 Document Revised: 10/15/2016 Document Reviewed: 10/15/2016   It was a pleasure meeting you today.   Today we discussed your hight blood pressure, diabetes, and constipation   Please follow up in 1 week for a nursing visit and 1 month for a visit with a physician to follow up on Blood pressure or sooner if symptoms persist or worsen. Please call the clinic immediately if you  have any concerns.   Our clinic's number is [phone]. Please call with questions or concerns.   Thank you,  Caroline More, DO  [employer] Interactive Patient Education  [employer].

## 2017-10-02 NOTE — Assessment & Plan Note (Signed)
-  prescribed miralax daily -advised to take senna once a week -continue to monitor -follow up as needed

## 2017-10-03 LAB — BASIC METABOLIC PANEL
BUN/Creatinine Ratio: 15 (ref 9–23)
BUN: 39 mg/dL — ABNORMAL HIGH (ref 6–24)
CO2: 16 mmol/L — ABNORMAL LOW (ref 20–29)
Calcium: 9.1 mg/dL (ref 8.7–10.2)
Chloride: 109 mmol/L — ABNORMAL HIGH (ref 96–106)
Creatinine, Ser: 2.64 mg/dL — ABNORMAL HIGH (ref 0.57–1.00)
GFR, EST AFRICAN AMERICAN: 23 mL/min/{1.73_m2} — AB (ref >59)
GFR, EST NON AFRICAN AMERICAN: 20 mL/min/{1.73_m2} — AB (ref >59)
Glucose: 91 mg/dL (ref 65–99)
POTASSIUM: 5.2 mmol/L (ref 3.5–5.2)
SODIUM: 138 mmol/L (ref 134–144)

## 2017-10-07 NOTE — Progress Notes (Deleted)
   Subjective:    Patient ID: Sarah Kline, female    DOB: 1966/03/24, 51 y.o.   MRN: [id number]   CC: HTN follow up & elevated Cr  HPI: Hypertension: - Medications: amlodipine 10mg  daily, HCTZ 12.5 mg daily  - Compliance: *** - Checking BP at home: *** - Denies any SOB, CP, vision changes, LE edema, medication SEs, or symptoms of hypotension - Diet: *** - Exercise: *** - last BP on 11/2 166/80. BP today ***  Elevated Cr Cr elevated to 2.64 on 11/2 from 2.05 on 5/23.  Seen in ED in June 2016 with elevated Cr. At that time recommended for CT to rule out obstruction but patient declined. Patient at that time had urinary changes.  Patient had previously been taking lisinopril 10mg  but has not taken in *** months.  Patient does *** use NSAIDs  Smoking status reviewed  Review of Systems   Objective:  There were no vitals taken for this visit. Vitals and nursing note reviewed  General: well nourished, in no acute distress HEENT: normocephalic, TM's visualized bilaterally, no scleral icterus or conjunctival pallor, no nasal discharge, moist mucous membranes, good dentition without erythema or discharge noted in posterior oropharynx Neck: supple, non-tender, without lymphadenopathy Cardiac: RRR, clear S1 and S2, no murmurs, rubs, or gallops Respiratory: clear to auscultation bilaterally, no increased work of breathing Abdomen: soft, nontender, nondistended, no masses or organomegaly. Bowel sounds present Extremities: no edema or cyanosis. Warm, well perfused. 2+ radial and PT pulses bilaterally Skin: warm and dry, no rashes noted Neuro: alert and oriented, no focal deficits   Assessment & Plan:    No problem-specific Assessment & Plan notes found for this encounter.    No Follow-up on file.   Caroline More, DO, PGY-1

## 2017-10-08 ENCOUNTER — Ambulatory Visit (INDEPENDENT_AMBULATORY_CARE_PROVIDER_SITE_OTHER): Payer: Medicaid Other | Admitting: *Deleted

## 2017-10-08 ENCOUNTER — Ambulatory Visit: Payer: Medicaid Other | Admitting: Family Medicine

## 2017-10-08 ENCOUNTER — Telehealth: Payer: Self-pay | Admitting: *Deleted

## 2017-10-08 VITALS — BP 180/100

## 2017-10-08 DIAGNOSIS — I10 Essential (primary) hypertension: Secondary | ICD-10-CM

## 2017-10-08 DIAGNOSIS — Z0130 Encounter for examination of blood pressure without abnormal findings: Secondary | ICD-10-CM

## 2017-10-08 NOTE — Progress Notes (Signed)
   Patient presented with husband for BP check. Patient had not yet restarted amlodipine or HCTZ. Had not heard from [facility] so didn't realize they were ready. Spoke with Juliann Pulse at [facility] and all prescriptions written on 10/02/17 are ready. Patient will pick up meds today and start today. Discussed taking BP meds at least 2 hours before next appt on 10/14/2017. Appt made with PCP as there are no nurse appts available.  BP 180/100 left arm manually with adult cuff Denies headache, CP, SHOB at present. Had headache last evening that has resolved.  Patient notified that [facility] recommended trying [facility] for BP cuff with audio.  Hubbard Hartshorn, RN, BSN

## 2017-10-08 NOTE — Telephone Encounter (Signed)
Patient called in requesting OV notes from every OV at [facility] in 2018. States this is for her attorney. Aware that request will need to be approved by PCP. Please call her at [phone] when ready as she will come to office to pick up. Hubbard Hartshorn, RN, BSN

## 2017-10-13 NOTE — Progress Notes (Signed)
Subjective:    Patient ID: Sarah Kline, female    DOB: 10/11/1966, 51 y.o.   MRN: [id number]   CC: HTN follow up & elevated Cr  HPI: Hypertension: - Medications: amlodipine 10mg  daily, HCTZ 12.5 mg daily  - Compliance: patient was not taking amlodipine. Was getting confused on which medications to take and which ones she was discontinued off of.  - Checking BP at home: no-does not have BP cuff at home  - Denies any SOB, CP, LE edema, medication SEs, or symptoms of hypotension - Diet: does not eat healthy. States she eats breakfast, but snacks throughout the day. States she used to eat jalapeno potato chips but now eats more popcorn and bananas. States she mostly eats ice throughout the day  - Exercise: none - last BP 180/100 during nursing visit on 11/08, patient had not started BP medications at that time. BP today 168/90, took HCTZ this am.   Elevated Cr Cr elevated to 2.64 on 11/2 from 2.05 on 5/23. Patient with no known history of renal disorders. States that she may have had a kidney stone in the past that past on its own. Denies any pain or urinary symptoms. Denies NSAID use. Patient has very minimal water intake, drinking less than 1/2 a glass of water a day. Patient was seen in ED in June 2016 with elevated Cr. At that time recommended for CT to rule out obstruction but patient declined. Patient at that time had urinary changes. Patient had previously been taking lisinopril 10mg  but has not taken in ~2 months.   Craving Ice Patient today reports craving ice. States she eats ice throughout the day. Has a history of anemia but has not checked recently.   Constipation Patient today stated continued constipation. Patient states she takes miralax daily but no improvements noted. No interest in taking colace or senna as she does not want to take any more medications.   Objective:  BP (!) 168/90   Pulse 82   Temp 98.2 F (36.8 C) (Oral)   Ht 5\' 4"  (1.626 m)   Wt 228 lb  9.6 oz (103.7 kg)   SpO2 99%   BMI 39.24 kg/m  Vitals and nursing note reviewed  General: well nourished, in no acute distress  Neck: supple, non-tender, without lymphadenopathy Cardiac: RRR, clear S1 and S2, no murmurs, rubs, or gallops Respiratory: clear to auscultation bilaterally, no increased work of breathing Abdomen: soft, nontender, nondistended, no masses or organomegaly. Bowel sounds present Extremities: no edema or cyanosis. Warm, well perfused. 2+ radial and PT pulses bilaterally Skin: warm and dry, no rashes noted Neuro: alert and oriented, no focal deficits  Assessment & Plan:    HTN (hypertension) Patient today with elevated BP of 168/90. Patient has not taken amlodipine. Patient instructed to begin taking amlodipine daily. Advised patient to switch to regimen of atenolol and amlodipine but patient states she would like to continue HCTZ as she just picked up prescription.  -continue HCTZ and amlodipine per patient request, consider switching to atenolol given elevated Cr and decreased GFR  -repeat BMP today and again in 2 weeks -follow up in 1 month for BP monitoring   Elevated serum creatinine Patient with recent labs on 10/02/17 showing elevated creatinine of 2.64. GFR at that time 23. UA today showing elevated protein and moderate RBCs.  -referral to nephrology  -consider kidney ultrasound  -would recommend changing HCTZ to atenolol, but patient requested staying on HCTZ as she just refilled prescription  -  encouraged increased fluid hydration  -repeat BMP in 2 weeks -follow up pending nephrology recommendations   Anemia Patient with history of craving ice. Has PMHx of anemia but has not been tested recently. -will obtain CBC and iron panel today  -if iron deficiency will replete with iron supplementation  -miralax bid for constipation  -follow up as needed  Constipation Patient with continued constipation.  -prescribed miralax bid prn  -continue to  monitor -encouraged increased fluid hydration  -follow up as needed   Return in about 2 weeks (around 10/28/2017) for BMP and Blood pressure .  Caroline More, DO, PGY-1

## 2017-10-14 ENCOUNTER — Other Ambulatory Visit: Payer: Self-pay

## 2017-10-14 ENCOUNTER — Ambulatory Visit: Payer: Medicaid Other | Admitting: Family Medicine

## 2017-10-14 ENCOUNTER — Encounter: Payer: Self-pay | Admitting: Family Medicine

## 2017-10-14 VITALS — BP 168/90 | HR 82 | Temp 98.2°F | Ht 64.0 in | Wt 228.6 lb

## 2017-10-14 DIAGNOSIS — K5900 Constipation, unspecified: Secondary | ICD-10-CM

## 2017-10-14 DIAGNOSIS — R7989 Other specified abnormal findings of blood chemistry: Secondary | ICD-10-CM | POA: Diagnosis not present

## 2017-10-14 DIAGNOSIS — I10 Essential (primary) hypertension: Secondary | ICD-10-CM | POA: Diagnosis present

## 2017-10-14 DIAGNOSIS — D649 Anemia, unspecified: Secondary | ICD-10-CM

## 2017-10-14 DIAGNOSIS — N179 Acute kidney failure, unspecified: Secondary | ICD-10-CM | POA: Insufficient documentation

## 2017-10-14 DIAGNOSIS — D631 Anemia in chronic kidney disease: Secondary | ICD-10-CM | POA: Insufficient documentation

## 2017-10-14 DIAGNOSIS — D509 Iron deficiency anemia, unspecified: Secondary | ICD-10-CM | POA: Diagnosis not present

## 2017-10-14 LAB — POCT URINALYSIS DIP (MANUAL ENTRY)
BILIRUBIN UA: NEGATIVE mg/dL
Bilirubin, UA: NEGATIVE
Glucose, UA: NEGATIVE mg/dL
Leukocytes, UA: NEGATIVE
Nitrite, UA: NEGATIVE
PH UA: 6 (ref 5.0–8.0)
SPEC GRAV UA: 1.02 (ref 1.010–1.025)
Urobilinogen, UA: 0.2 E.U./dL

## 2017-10-14 MED ORDER — POLYETHYLENE GLYCOL 3350 17 GM/SCOOP PO POWD: 17.0000 g | Freq: Two times a day (BID) | ORAL | 1 refills | Status: DC | PRN

## 2017-10-14 NOTE — Assessment & Plan Note (Addendum)
Patient today with elevated BP of 168/90. Patient has not taken amlodipine. Patient instructed to begin taking amlodipine daily. Advised patient to switch to regimen of atenolol and amlodipine but patient states she would like to continue HCTZ as she just picked up prescription.  -continue HCTZ and amlodipine per patient request, consider switching to atenolol given elevated Cr and decreased GFR  -repeat BMP today and again in 2 weeks -follow up in 1 month for BP monitoring

## 2017-10-14 NOTE — Assessment & Plan Note (Signed)
Patient with continued constipation.  -prescribed miralax bid prn  -continue to monitor -encouraged increased fluid hydration  -follow up as needed

## 2017-10-14 NOTE — Assessment & Plan Note (Addendum)
Patient with history of craving ice. Has PMHx of anemia but has not been tested recently. -will obtain CBC and iron panel today  -if iron deficiency will replete with iron supplementation  -miralax bid for constipation  -follow up as needed

## 2017-10-14 NOTE — Assessment & Plan Note (Addendum)
Patient with recent labs on 10/02/17 showing elevated creatinine of 2.64. GFR at that time 23. UA today showing elevated protein and moderate RBCs.  -referral to nephrology  -consider kidney ultrasound  -would recommend changing HCTZ to atenolol, but patient requested staying on HCTZ as she just refilled prescription  -encouraged increased fluid hydration  -repeat BMP in 2 weeks -follow up pending nephrology recommendations

## 2017-10-14 NOTE — Patient Instructions (Signed)
Hypertension Hypertension, commonly called high blood pressure, is when the force of blood pumping through the arteries is too strong. The arteries are the blood vessels that carry blood from the heart throughout the body. Hypertension forces the heart to work harder to pump blood and may cause arteries to become narrow or stiff. Having untreated or uncontrolled hypertension can cause heart attacks, strokes, kidney disease, and other problems. A blood pressure reading consists of a higher number over a lower number. Ideally, your blood pressure should be below 120/80. The first ("top") number is called the systolic pressure. It is a measure of the pressure in your arteries as your heart beats. The second ("bottom") number is called the diastolic pressure. It is a measure of the pressure in your arteries as the heart relaxes. What are the causes? The cause of this condition is not known. What increases the risk? Some risk factors for high blood pressure are under your control. Others are not. Factors you can change  Smoking.  Having type 2 diabetes mellitus, high cholesterol, or both.  Not getting enough exercise or physical activity.  Being overweight.  Having too much fat, sugar, calories, or salt (sodium) in your diet.  Drinking too much alcohol. Factors that are difficult or impossible to change  Having chronic kidney disease.  Having a family history of high blood pressure.  Age. Risk increases with age.  Race. You may be at higher risk if you are African-American.  Gender. Men are at higher risk than women before age 45. After age 65, women are at higher risk than men.  Having obstructive sleep apnea.  Stress. What are the signs or symptoms? Extremely high blood pressure (hypertensive crisis) may cause:  Headache.  Anxiety.  Shortness of breath.  Nosebleed.  Nausea and vomiting.  Severe chest pain.  Jerky movements you cannot control (seizures).  How is this  diagnosed? This condition is diagnosed by measuring your blood pressure while you are seated, with your arm resting on a surface. The cuff of the blood pressure monitor will be placed directly against the skin of your upper arm at the level of your heart. It should be measured at least twice using the same arm. Certain conditions can cause a difference in blood pressure between your right and left arms. Certain factors can cause blood pressure readings to be lower or higher than normal (elevated) for a short period of time:  When your blood pressure is higher when you are in a health care provider's office than when you are at home, this is called white coat hypertension. Most people with this condition do not need medicines.  When your blood pressure is higher at home than when you are in a health care provider's office, this is called masked hypertension. Most people with this condition may need medicines to control blood pressure.  If you have a high blood pressure reading during one visit or you have normal blood pressure with other risk factors:  You may be asked to return on a different day to have your blood pressure checked again.  You may be asked to monitor your blood pressure at home for 1 week or longer.  If you are diagnosed with hypertension, you may have other blood or imaging tests to help your health care provider understand your overall risk for other conditions. How is this treated? This condition is treated by making healthy lifestyle changes, such as eating healthy foods, exercising more, and reducing your alcohol intake. Your   health care provider may prescribe medicine if lifestyle changes are not enough to get your blood pressure under control, and if:  Your systolic blood pressure is above 130.  Your diastolic blood pressure is above 80.  Your personal target blood pressure may vary depending on your medical conditions, your age, and other factors. Follow these  instructions at home: Eating and drinking  Eat a diet that is high in fiber and potassium, and low in sodium, added sugar, and fat. An example eating plan is called the DASH (Dietary Approaches to Stop Hypertension) diet. To eat this way: ? Eat plenty of fresh fruits and vegetables. Try to fill half of your plate at each meal with fruits and vegetables. ? Eat whole grains, such as whole wheat pasta, brown rice, or whole grain bread. Fill about one quarter of your plate with whole grains. ? Eat or drink low-fat dairy products, such as skim milk or low-fat yogurt. ? Avoid fatty cuts of meat, processed or cured meats, and poultry with skin. Fill about one quarter of your plate with lean proteins, such as fish, chicken without skin, beans, eggs, and tofu. ? Avoid premade and processed foods. These tend to be higher in sodium, added sugar, and fat.  Reduce your daily sodium intake. Most people with hypertension should eat less than 1,500 mg of sodium a day.  Limit alcohol intake to no more than 1 drink a day for nonpregnant women and 2 drinks a day for men. One drink equals 12 oz of beer, 5 oz of wine, or 1 oz of hard liquor. Lifestyle  Work with your health care provider to maintain a healthy body weight or to lose weight. Ask what an ideal weight is for you.  Get at least 30 minutes of exercise that causes your heart to beat faster (aerobic exercise) most days of the week. Activities may include walking, swimming, or biking.  Include exercise to strengthen your muscles (resistance exercise), such as pilates or lifting weights, as part of your weekly exercise routine. Try to do these types of exercises for 30 minutes at least 3 days a week.  Do not use any products that contain nicotine or tobacco, such as cigarettes and e-cigarettes. If you need help quitting, ask your health care provider.  Monitor your blood pressure at home as told by your health care provider.  Keep all follow-up visits as  told by your health care provider. This is important. Medicines  Take over-the-counter and prescription medicines only as told by your health care provider. Follow directions carefully. Blood pressure medicines must be taken as prescribed.  Do not skip doses of blood pressure medicine. Doing this puts you at risk for problems and can make the medicine less effective.  Ask your health care provider about side effects or reactions to medicines that you should watch for. Contact a health care provider if:  You think you are having a reaction to a medicine you are taking.  You have headaches that keep coming back (recurring).  You feel dizzy.  You have swelling in your ankles.  You have trouble with your vision. Get help right away if:  You develop a severe headache or confusion.  You have unusual weakness or numbness.  You feel faint.  You have severe pain in your chest or abdomen.  You vomit repeatedly.  You have trouble breathing. Summary  Hypertension is when the force of blood pumping through your arteries is too strong. If this condition is not   controlled, it may put you at risk for serious complications.  Your personal target blood pressure may vary depending on your medical conditions, your age, and other factors. For most people, a normal blood pressure is less than 120/80.  Hypertension is treated with lifestyle changes, medicines, or a combination of both. Lifestyle changes include weight loss, eating a healthy, low-sodium diet, exercising more, and limiting alcohol. This information is not intended to replace advice given to you by your health care provider. Make sure you discuss any questions you have with your health care provider. Document Released: 11/17/2005 Document Revised: 10/15/2016 Document Reviewed: 10/15/2016 Elsevier Interactive Patient Education  [employer].   It was a pleasure seeing you today.   Today we discussed you high blood pressure,  your elevated creatinine, and constipation.   For your High blood pressure: please take amlodipine 10mg  daily and hydrochlorothiazide 12.5mg  daily. I would also recommend a diet low in salt. I would like you to come back in 2 weeks for a follow up on labs and BP check.   For your elevated Creatinine: I have redrawn labs today and have referred you to a nephrologist.   For your constipation: I have ordered miralax that can be taken twice daily as needed for constipation.  For your symptoms of eating ice: I have ordered an iron panel to make sure your iron levels are normal. If they are abnormal I will call in iron supplements to the pharmacy.   For all labs ordered today I will either call or send a letter with results.   For your constipation:   Please follow up in 2 weeks or sooner if symptoms persist or worsen. Please call the clinic immediately if you have concerns.   Our clinic's number is [phone]. Please call with questions or concerns.   Thank you,  Caroline More, DO

## 2017-10-15 ENCOUNTER — Other Ambulatory Visit: Payer: Self-pay | Admitting: Family Medicine

## 2017-10-15 DIAGNOSIS — D509 Iron deficiency anemia, unspecified: Secondary | ICD-10-CM

## 2017-10-15 LAB — CBC WITH DIFFERENTIAL/PLATELET
Basophils Absolute: 0.1 10*3/uL (ref 0.0–0.2)
Basos: 1 %
EOS (ABSOLUTE): 0.2 10*3/uL (ref 0.0–0.4)
EOS: 2 %
Hemoglobin: 8.8 g/dL — ABNORMAL LOW (ref 11.1–15.9)
IMMATURE GRANULOCYTES: 0 %
Immature Grans (Abs): 0 10*3/uL (ref 0.0–0.1)
LYMPHS ABS: 1.7 10*3/uL (ref 0.7–3.1)
Lymphs: 20 %
MCH: 24.2 pg — AB (ref 26.6–33.0)
MCHC: 30.1 g/dL — ABNORMAL LOW (ref 31.5–35.7)
MCV: 80 fL (ref 79–97)
MONOS ABS: 0.6 10*3/uL (ref 0.1–0.9)
Monocytes: 7 %
NEUTROS PCT: 70 %
Neutrophils Absolute: 5.7 10*3/uL (ref 1.4–7.0)
PLATELETS: 423 10*3/uL — AB (ref 150–379)
RBC: 3.64 x10E6/uL — ABNORMAL LOW (ref 3.77–5.28)
RDW: 17.2 % — AB (ref 12.3–15.4)
WBC: 8.2 10*3/uL (ref 3.4–10.8)

## 2017-10-15 LAB — BASIC METABOLIC PANEL
BUN / CREAT RATIO: 19 (ref 9–23)
BUN: 40 mg/dL — AB (ref 6–24)
CHLORIDE: 106 mmol/L (ref 96–106)
CO2: 17 mmol/L — ABNORMAL LOW (ref 20–29)
Calcium: 9.1 mg/dL (ref 8.7–10.2)
Creatinine, Ser: 2.07 mg/dL — ABNORMAL HIGH (ref 0.57–1.00)
GFR calc non Af Amer: 27 mL/min/{1.73_m2} — ABNORMAL LOW (ref >59)
GFR, EST AFRICAN AMERICAN: 31 mL/min/{1.73_m2} — AB (ref >59)
GLUCOSE: 109 mg/dL — AB (ref 65–99)
Potassium: 5.2 mmol/L (ref 3.5–5.2)
SODIUM: 139 mmol/L (ref 134–144)

## 2017-10-15 LAB — ANEMIA PANEL
FERRITIN: 13 ng/mL — AB (ref 15–150)
FOLATE, HEMOLYSATE: 345.3 ng/mL
FOLATE, RBC: 1183 ng/mL (ref >498)
Hematocrit: 29.2 % — ABNORMAL LOW (ref 34.0–46.6)
Iron Saturation: 8 % — CL (ref 15–55)
Iron: 30 ug/dL (ref 27–159)
RETIC CT PCT: 1.6 % (ref 0.6–2.6)
Total Iron Binding Capacity: 368 ug/dL (ref 250–450)
UIBC: 338 ug/dL (ref 131–425)
VITAMIN B 12: 497 pg/mL (ref 232–1245)

## 2017-10-15 MED ORDER — FERROUS SULFATE 325 (65 FE) MG PO TBEC: 325.0000 mg | DELAYED_RELEASE_TABLET | Freq: Three times a day (TID) | ORAL | 2 refills | Status: DC

## 2017-10-15 NOTE — Progress Notes (Signed)
Lab results showing iron deficiency. Ferrous sulfate ordered to be taken tid. Patient made aware and will pick up prescription.   Dalphine Handing, PGY-1 [facility] 10/15/2017 1:55 PM

## 2017-10-26 NOTE — Progress Notes (Signed)
   Subjective:    Patient ID: Sarah Kline, female    DOB: [birth year], 51 y.o.   MRN: [id number]   CC: Elevated Cr and HTN  HPI: Hypertension: - Medications: HCTZ 12.5mg  and amlodipine 10 mg - Compliance: takes medications daily  - Checking BP at home: no  - Denies any SOB, CP, LE edema, medication SEs, or symptoms of hypotension - Diet: patient reports healthy diet. States her husband makes all her food  - Exercise: no exercise. 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 Husband states he will try and help her exercise daily.   Elevated Creatinine Patient denies urinary symptoms. Denies pain, burning, or urgency. States some incontinence but is followed by urology for this problem.   Anemia Patient today reports less craving of ice. States she still eats ice during the day but notes less craving. Patient reports non compliance with iron supplementation stating she is only taking it twice a day. Denies dizziness, lightheadedness, or syncope.   Smoking status reviewed. Non smoker   Objective:  BP (!) 148/68   Pulse 81   Temp 98.6 F (37 C) (Oral)   Wt 226 lb (102.5 kg)   LMP 10/07/2017   SpO2 99%   BMI 38.79 kg/m  Vitals and nursing note reviewed  General: well nourished, in no acute distress Neck: supple, non-tender, without lymphadenopathy Cardiac: RRR, clear S1 and S2, no murmurs, rubs, or gallops Respiratory: clear to auscultation bilaterally, no increased work of breathing Abdomen: soft, nontender, nondistended, no masses or organomegaly. Bowel sounds present Extremities: no edema or cyanosis. Warm, well perfused. 2+ radial pulses bilaterally Skin: warm and dry, no rashes noted Neuro: alert and oriented, no focal deficits  Assessment & Plan:    HTN (hypertension) Patient today with BP of 148/68, improved from 168/90 at previous appointment. Patient showing improvement with compliance on medications. Patient instructed to continue  taking medications as prescribed, HCTZ and amlodipine daily. Patient still wishes to stay on HCTZ until she runs out of her prescription before switching to atenolol.  -continue HCTZ and amlodipine per patient request, consider switching to atenolol given elevated Cr and decreased GFR -follow up in 2 months for BP monitoring or sooner if symptoms persist or sooner   Elevated serum creatinine Labs on 10/14/17 showing improvement in Cr to 2.07 from previous 2.64 reading.  -patient already referred to nephrology, plans to follow up with them  -consider kidney ultrasound if needed -would recommend changing HCTZ to atenolol, but patient requested staying on HCTZ as she just refilled prescription  -encouraged increased fluid hydration  -follow up pending nephrology recommendations   Anemia Patient reports improvement in ice craving, but still has some cravings. Patient reports non compliance with iron supplementation, only taking twice a day. Hgb today unchanged at 8.8.  -continue iron supplementation three times a day. Husband states he will help to make sure patient is compliant  -miralax for constipation  -recommended colonoscopy for both anemia and healthcare maintenance  -consider blood smear if continues to be anemic  -follow up if symptomatic or as needed   Healthcare maintenance -have given patient form on scheduling colonoscopy. Patient verbalized she will schedule.    Return in about 2 months (around 12/27/2017).   Caroline More, DO, PGY-1

## 2017-10-27 ENCOUNTER — Ambulatory Visit: Payer: Medicaid Other | Admitting: Family Medicine

## 2017-10-27 ENCOUNTER — Other Ambulatory Visit: Payer: Self-pay

## 2017-10-27 ENCOUNTER — Encounter: Payer: Self-pay | Admitting: Family Medicine

## 2017-10-27 VITALS — BP 148/68 | HR 81 | Temp 98.6°F | Wt 226.0 lb

## 2017-10-27 DIAGNOSIS — I10 Essential (primary) hypertension: Secondary | ICD-10-CM | POA: Diagnosis not present

## 2017-10-27 DIAGNOSIS — D649 Anemia, unspecified: Secondary | ICD-10-CM

## 2017-10-27 DIAGNOSIS — Z0000 Encounter for general adult medical examination without abnormal findings: Secondary | ICD-10-CM | POA: Diagnosis not present

## 2017-10-27 DIAGNOSIS — R7989 Other specified abnormal findings of blood chemistry: Secondary | ICD-10-CM

## 2017-10-27 LAB — POCT HEMOGLOBIN: HEMOGLOBIN: 8.8 g/dL — AB (ref 12.2–16.2)

## 2017-10-27 NOTE — Assessment & Plan Note (Signed)
-  have given patient form on scheduling colonoscopy. Patient verbalized she will schedule.

## 2017-10-27 NOTE — Assessment & Plan Note (Addendum)
Labs on 10/14/17 showing improvement in Cr to 2.07 from previous 2.64 reading.  -patient already referred to nephrology, plans to follow up with them  -consider kidney ultrasound if needed -would recommend changing HCTZ to atenolol, but patient requested staying on HCTZ as she just refilled prescription  -encouraged increased fluid hydration  -follow up pending nephrology recommendations

## 2017-10-27 NOTE — Assessment & Plan Note (Signed)
Patient today with BP of 148/68, improved from 168/90 at previous appointment. Patient showing improvement with compliance on medications. Patient instructed to continue taking medications as prescribed, HCTZ and amlodipine daily. Patient still wishes to stay on HCTZ until she runs out of her prescription before switching to atenolol.  -continue HCTZ and amlodipine per patient request, consider switching to atenolol given elevated Cr and decreased GFR -follow up in 2 months for BP monitoring or sooner if symptoms persist or sooner

## 2017-10-27 NOTE — Assessment & Plan Note (Signed)
Patient reports improvement in ice craving, but still has some cravings. Patient reports non compliance with iron supplementation, only taking twice a day. Hgb today unchanged at 8.8.  -continue iron supplementation three times a day. Husband states he will help to make sure patient is compliant  -miralax for constipation  -recommended colonoscopy for both anemia and healthcare maintenance  -consider blood smear if continues to be anemic  -follow up if symptomatic or as needed

## 2017-10-27 NOTE — Patient Instructions (Signed)
Hypertension Hypertension is another name for high blood pressure. High blood pressure forces your heart to work harder to pump blood. This can cause problems over time. There are two numbers in a blood pressure reading. There is a top number (systolic) over a bottom number (diastolic). It is best to have a blood pressure below 120/80. Healthy choices can help lower your blood pressure. You may need medicine to help lower your blood pressure if:  Your blood pressure cannot be lowered with healthy choices.  Your blood pressure is higher than 130/80.  Follow these instructions at home: Eating and drinking  If directed, follow the DASH eating plan. This diet includes: ? Filling half of your plate at each meal with fruits and vegetables. ? Filling one quarter of your plate at each meal with whole grains. Whole grains include whole wheat pasta, brown rice, and whole grain bread. ? Eating or drinking low-fat dairy products, such as skim milk or low-fat yogurt. ? Filling one quarter of your plate at each meal with low-fat (lean) proteins. Low-fat proteins include fish, skinless chicken, eggs, beans, and tofu. ? Avoiding fatty meat, cured and processed meat, or chicken with skin. ? Avoiding premade or processed food.  Eat less than 1,500 mg of salt (sodium) a day.  Limit alcohol use to no more than 1 drink a day for nonpregnant women and 2 drinks a day for men. One drink equals 12 oz of beer, 5 oz of wine, or 1 oz of hard liquor. Lifestyle  Work with your doctor to stay at a healthy weight or to lose weight. Ask your doctor what the best weight is for you.  Get at least 30 minutes of exercise that causes your heart to beat faster (aerobic exercise) most days of the week. This may include walking, swimming, or biking.  Get at least 30 minutes of exercise that strengthens your muscles (resistance exercise) at least 3 days a week. This may include lifting weights or pilates.  Do not use any  products that contain nicotine or tobacco. This includes cigarettes and e-cigarettes. If you need help quitting, ask your doctor.  Check your blood pressure at home as told by your doctor.  Keep all follow-up visits as told by your doctor. This is important. Medicines  Take over-the-counter and prescription medicines only as told by your doctor. Follow directions carefully.  Do not skip doses of blood pressure medicine. The medicine does not work as well if you skip doses. Skipping doses also puts you at risk for problems.  Ask your doctor about side effects or reactions to medicines that you should watch for. Contact a doctor if:  You think you are having a reaction to the medicine you are taking.  You have headaches that keep coming back (recurring).  You feel dizzy.  You have swelling in your ankles.  You have trouble with your vision. Get help right away if:  You get a very bad headache.  You start to feel confused.  You feel weak or numb.  You feel faint.  You get very bad pain in your: ? Chest. ? Belly (abdomen).  You throw up (vomit) more than once.  You have trouble breathing. Summary  Hypertension is another name for high blood pressure.  Making healthy choices can help lower blood pressure. If your blood pressure cannot be controlled with healthy choices, you may need to take medicine. This information is not intended to replace advice given to you by your health care   provider. Make sure you discuss any questions you have with your health care provider. Document Released: 05/05/2008 Document Revised: 10/15/2016 Document Reviewed: 10/15/2016 Elsevier Interactive Patient Education  [employer].   It was a pleasure seeing you today.   Today we discussed your high blood pressure and elevated kidney levels.   For your high blood pressure: continue hydrochlorothiazide 12.5mg  and amlodipine 10mg . I would continue a healthy diet and walk when you can.    For your low iron: please take iron 3 times a day.   I would recommend you get a colonoscopy as soon as you can. If you need the information again for how to schedule this please let us know.   Please follow up in 2 months for blood pressure or sooner if symptoms persist or worsen. If you continue to crave ice, or feel dizzy/lighheadedness please come in sooner.  Please call the clinic immediately if you have concerns.   Our clinic's number is [phone]. Please call with questions or concerns.   Thank you,  Caroline More, DO

## 2017-12-14 ENCOUNTER — Telehealth: Payer: Self-pay | Admitting: *Deleted

## 2017-12-14 ENCOUNTER — Other Ambulatory Visit: Payer: Self-pay | Admitting: Family Medicine

## 2017-12-14 DIAGNOSIS — I10 Essential (primary) hypertension: Secondary | ICD-10-CM

## 2017-12-14 NOTE — Telephone Encounter (Signed)
Patient called in stating she is running out of HCTZ and wanted to know if PCP wanted to switch to atenolol as discussed at [facility] on 10/27/2017. Patient uses [facility]. Please call her at [phone] to let her know decision. Patient scheduled first available appt with PCP for BP f/u on 01/04/2018. Hubbard Hartshorn, RN, BSN

## 2017-12-15 ENCOUNTER — Other Ambulatory Visit: Payer: Self-pay | Admitting: Family Medicine

## 2017-12-15 DIAGNOSIS — I10 Essential (primary) hypertension: Secondary | ICD-10-CM

## 2017-12-16 ENCOUNTER — Other Ambulatory Visit: Payer: Self-pay | Admitting: Family Medicine

## 2017-12-16 MED ORDER — ATENOLOL 25 MG PO TABS: 50.0000 mg | ORAL_TABLET | Freq: Every day | ORAL | 0 refills | Status: DC

## 2017-12-16 NOTE — Telephone Encounter (Signed)
Have called patient to discuss new regimen. Patient will now be on atenolol and amlodipine. Patient will pick up prescription for atenolol which is left at front desk.

## 2017-12-16 NOTE — Telephone Encounter (Signed)
Called patient and informed her she should follow up in 1 week for nursing BP check. Patient verbalized understanding and agreed to make follow up.   Dalphine Handing, PGY-1 [facility] 12/16/2017 8:31 AM

## 2017-12-16 NOTE — Progress Notes (Signed)
As discussed in previous appointments with patient (10/14/17 and 10/27/17), will switch HTN medication regimen to atenolol 50mg  (as this is initial starting dose) and patient should continue amlodipine 10mg . Patient should discontinue HCTZ.   Have called patient and discussed plan. Patient in agreement.   Dalphine Handing, PGY-1 [facility] 12/16/2017 8:16 AM

## 2017-12-21 ENCOUNTER — Encounter: Payer: Self-pay | Admitting: Family Medicine

## 2017-12-23 ENCOUNTER — Ambulatory Visit (INDEPENDENT_AMBULATORY_CARE_PROVIDER_SITE_OTHER): Payer: Medicaid Other

## 2017-12-23 VITALS — BP 150/84 | HR 54

## 2017-12-23 DIAGNOSIS — Z0130 Encounter for examination of blood pressure without abnormal findings: Secondary | ICD-10-CM

## 2017-12-23 DIAGNOSIS — I10 Essential (primary) hypertension: Secondary | ICD-10-CM

## 2017-12-23 NOTE — Progress Notes (Signed)
     Patient in for BP check following medication change of addition of Atenolol 50 mg daily to current regimen. Also takes Amlodipine 10 mg daily. Patient reports last dose taken at 9 am today.   Vitals:   12/23/17 1359 12/23/17 1405  BP: (!) 156/90 (!) 150/84  Pulse: (!) 55 (!) 54  SpO2: 99%    BP recheck of 150/84 and HR of 54 taken in left arm with large cuff.  Patient states tolerating medication well. No complaints of weakness or dizziness. F/U appt with PCP on 01/04/18. Will route to PCP for review. Danley Danker, RN [facility] RN)

## 2017-12-25 ENCOUNTER — Other Ambulatory Visit: Payer: Self-pay | Admitting: Family Medicine

## 2017-12-25 DIAGNOSIS — I10 Essential (primary) hypertension: Secondary | ICD-10-CM

## 2017-12-25 MED ORDER — ATENOLOL 25 MG PO TABS: 25.0000 mg | ORAL_TABLET | Freq: Every day | ORAL | 0 refills | Status: DC

## 2017-12-25 MED ORDER — HYDRALAZINE HCL 10 MG PO TABS: 10.0000 mg | ORAL_TABLET | Freq: Two times a day (BID) | ORAL | 1 refills | Status: DC

## 2017-12-25 NOTE — Progress Notes (Addendum)
Given patient's continued elevation in BP have changed regimen to amlodipine 10mg  daily, atenolol 25 mg daily, and hydralazine 10mg  twice a day.   Have called patient to explain plan and patient agreed. Have advised patient to check BP daily at home and she stated she would try and find someone at home to help with BP checks. Have also told patient to follow up in 1 week for nursing BP check and patient agreed to make appointment.   Discussed side effects and informed patient that if she feels dizziness or fatigue she should stop taking hydralazine and call clinic.   Have discussed patient with Dr. Gwendlyn Deutscher, preceptor for day.   Dalphine Handing, PGY-1 [facility] 12/25/2017 8:52 AM

## 2017-12-30 ENCOUNTER — Telehealth: Payer: Self-pay | Admitting: Family Medicine

## 2017-12-30 NOTE — Telephone Encounter (Signed)
Received fax from [facility] that patient is scheduled to see Dr. Jeneen Rinks L. Deterding on 01/18/2018 @ 10:45am.   Caroline More, DO, PGY-1 [facility] 12/30/2017 8:03 AM

## 2018-01-03 NOTE — Progress Notes (Signed)
   Subjective:    Patient ID: Sarah Kline, female    DOB: September 16, 1966, 52 y.o.   MRN: [id number]   CC: BP follow up and odor in belly button   HPI: Hypertension: - Medications: amlodipine 10mg , atenolol 25mg , hydralazine 10mg  bid  - Compliance: taking daily, took this am  - Checking BP at home: has bough cuff and husband plans on checking BP daily at home  - Denies any SOB, CP, vision changes, LE edema, medication SEs, or symptoms of hypotension.  - Diet: no changes in diet since last appointment  - Exercise: no exercise. 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 States that it has occurred more often x 1 month. States that when she did have vision it was black, but now is unable to see it. States she is unsure of drainage but notes clothes do not feel as if there is drainage. Denies warmth. Denies fever or chills. Denies irritation to area, itching, or pain.   Objective:  BP 126/72   Pulse (!) 58   Temp 98.7 F (37.1 C) (Oral)   Ht 5\' 4"  (1.626 m)   Wt 233 lb (105.7 kg)   LMP 12/17/2017 (Approximate)   SpO2 99%   BMI 39.99 kg/m  Vitals and nursing note reviewed  General: well nourished, in no acute distress HEENT: normocephalic Neck: supple, non-tender, without lymphadenopathy Cardiac: RRR, clear S1 and S2, no murmurs, rubs, or gallops Respiratory: clear to auscultation bilaterally, no increased work of breathing Abdomen: soft, nontender, nondistended, no masses or organomegaly. Bowel sounds present Extremities: no edema or cyanosis. Warm, well perfused. 2+ radial and PT pulses bilaterally. No pedal edema. No LE edema.  Skin: warm and dry, no rashes noted. Naval showing no skin breakdown, no odor noted, non tender to palpation  Neuro: alert and oriented, no focal deficits   Assessment & Plan:    HTN (hypertension) Patient today well controlled with BP of 126/72.  Improved from 150/84 at last nursing visit. Patient shows compliance on new medication regimen of amlodipine, atenolol, and hydralazine. Patient instructed to take BP daily and record readings. Informed to call with any BP that is elevated or low.  -continue amlodipine 10mg , atenolol 25mg , and hydralazine 10mg  bid  -record BP daily and call with readings that are elevated or low, patient agreed and verbalized agreement  -follow up in 3 months or sooner if symptoms persist or worsen   Yeast dermatitis Odor in naval likely 2/2 to yeast infection. No drainage noted on exam. Non tender, no erythema, or warmth on palpation.  -nystatin powder bid. Have advised to increase to tid if no improvement. If no improvement on tid dosing have advised to follow up after 2 weeks of using powder.  -follow up as needed     Return in about 3 months (around 04/03/2018).   Caroline More, DO, PGY-1

## 2018-01-04 ENCOUNTER — Encounter: Payer: Self-pay | Admitting: Family Medicine

## 2018-01-04 ENCOUNTER — Other Ambulatory Visit: Payer: Self-pay

## 2018-01-04 ENCOUNTER — Ambulatory Visit: Payer: Medicaid Other | Admitting: Family Medicine

## 2018-01-04 VITALS — BP 126/72 | HR 58 | Temp 98.7°F | Ht 64.0 in | Wt 233.0 lb

## 2018-01-04 DIAGNOSIS — I10 Essential (primary) hypertension: Secondary | ICD-10-CM

## 2018-01-04 DIAGNOSIS — B372 Candidiasis of skin and nail: Secondary | ICD-10-CM | POA: Diagnosis not present

## 2018-01-04 MED ORDER — NYSTATIN 100000 UNIT/GM EX POWD: Freq: Two times a day (BID) | CUTANEOUS | 0 refills | Status: DC

## 2018-01-04 NOTE — Assessment & Plan Note (Signed)
Odor in [employer] likely 2/2 to yeast infection. No drainage noted on exam. Non tender, no erythema, or warmth on palpation.  -nystatin powder bid. Have advised to increase to tid if no improvement. If no improvement on tid dosing have advised to follow up after 2 weeks of using powder.  -follow up as needed

## 2018-01-04 NOTE — Patient Instructions (Addendum)
Blood Pressure Record Sheet Your blood pressure on this visit to the emergency department or clinic is elevated. This does not necessarily mean you have high blood pressure (hypertension), but it does mean that your blood pressure needs to be rechecked. Many times your blood pressure can increase due to illness, pain, anxiety, or other factors. We recommend that you get a series of blood pressure readings done over a period of 5 days. It is best to get a reading in the morning and one in the evening. You should make sure to sit and relax for 1-5 minutes before the reading is taken. Write the readings down and make a follow-up appointment with your health care provider to discuss the results. If there is not a free clinic or a drug store with a blood-pressure-taking machine near you, you can purchase blood-pressure-taking equipment from a drug store. Having one in the home allows you the convenience of taking your blood pressure while you are home and relaxed. Blood Pressure Log Date: _______________________  a.m. _____________________  p.m. _____________________  Date: _______________________  a.m. _____________________  p.m. _____________________  Date: _______________________  a.m. _____________________  p.m. _____________________  Date: _______________________  a.m. _____________________  p.m. _____________________  Date: _______________________  a.m. _____________________  p.m. _____________________  This information is not intended to replace advice given to you by your health care provider. Make sure you discuss any questions you have with your health care provider. Document Released: 08/16/2003 Document Revised: 10/31/2016 Document Reviewed: 01/10/2014 Elsevier Interactive Patient Education  [employer].   It was a pleasure seeing you today.   Today we discussed your blood pressure and belly button odor  For your blood pressure: please continue your current  medication regimen. Please record home blood pressure readings and call with any high (>160/100) or low (<110/60) readings.   For your belly button: I have prescribed nystatin powder. Please use this twice a day. If this does not resolve please follow up   Please follow up in 3 months or sooner if symptoms persist or worsen. Please call the clinic immediately if you have any concerns.   Our clinic's number is [phone]. Please call with questions or concerns.   Please go to the emergency room if you have significantly low pressures, have dizziness/lightheadedness, or pass out.   Thank you,  Caroline More, DO

## 2018-01-04 NOTE — Assessment & Plan Note (Signed)
Patient today well controlled with BP of 126/72. Improved from 150/84 at last nursing visit. Patient shows compliance on new medication regimen of amlodipine, atenolol, and hydralazine. Patient instructed to take BP daily and record readings. Informed to call with any BP that is elevated or low.  -continue amlodipine 10mg , atenolol 25mg , and hydralazine 10mg  bid  -record BP daily and call with readings that are elevated or low, patient agreed and verbalized agreement  -follow up in 3 months or sooner if symptoms persist or worsen

## 2018-01-15 ENCOUNTER — Other Ambulatory Visit: Payer: Self-pay | Admitting: Family Medicine

## 2018-01-15 DIAGNOSIS — I10 Essential (primary) hypertension: Secondary | ICD-10-CM

## 2018-01-15 MED ORDER — ATENOLOL 25 MG PO TABS: 25.0000 mg | ORAL_TABLET | Freq: Every day | ORAL | 2 refills | Status: DC

## 2018-01-15 MED ORDER — HYDRALAZINE HCL 10 MG PO TABS: 10.0000 mg | ORAL_TABLET | Freq: Two times a day (BID) | ORAL | 3 refills | Status: DC

## 2018-01-20 ENCOUNTER — Other Ambulatory Visit: Payer: Self-pay | Admitting: Nephrology

## 2018-01-20 DIAGNOSIS — N184 Chronic kidney disease, stage 4 (severe): Secondary | ICD-10-CM

## 2018-01-26 ENCOUNTER — Telehealth: Payer: Self-pay | Admitting: Family Medicine

## 2018-01-26 NOTE — Telephone Encounter (Signed)
Received letter from Dr. Jeneen Rinks L. Deterding of his appointment with patient. Per letter, Dr. 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 Is currently screening for other abnormalities and trying to look for reversibility. Dr. Jimmy Footman suspects condition may get worse soon.   Dalphine Handing, PGY-1 [facility] 01/26/2018 2:01 PM

## 2018-01-28 ENCOUNTER — Other Ambulatory Visit: Payer: Self-pay

## 2018-01-28 DIAGNOSIS — I10 Essential (primary) hypertension: Secondary | ICD-10-CM

## 2018-01-28 MED ORDER — HYDRALAZINE HCL 10 MG PO TABS: 10.0000 mg | ORAL_TABLET | Freq: Two times a day (BID) | ORAL | 3 refills | Status: DC

## 2018-01-28 NOTE — Telephone Encounter (Signed)
Patient called and needs refill on Hydralazine. States pharmacy says there are no refills. Danley Danker, RN [facility] RN)

## 2018-01-29 ENCOUNTER — Ambulatory Visit
Admission: RE | Admit: 2018-01-29 | Discharge: 2018-01-29 | Disposition: A | Discharge status: Home / Self Care | Payer: Medicaid Other | Source: Ambulatory Visit | Attending: Nephrology | Admitting: Nephrology

## 2018-01-29 DIAGNOSIS — N184 Chronic kidney disease, stage 4 (severe): Secondary | ICD-10-CM

## 2018-02-15 ENCOUNTER — Other Ambulatory Visit: Payer: Self-pay | Admitting: Family Medicine

## 2018-02-15 DIAGNOSIS — D509 Iron deficiency anemia, unspecified: Secondary | ICD-10-CM

## 2018-02-23 ENCOUNTER — Other Ambulatory Visit: Payer: Self-pay | Admitting: Family Medicine

## 2018-02-26 ENCOUNTER — Other Ambulatory Visit: Payer: Self-pay | Admitting: Family Medicine

## 2018-02-26 DIAGNOSIS — N184 Chronic kidney disease, stage 4 (severe): Principal | ICD-10-CM

## 2018-02-26 DIAGNOSIS — E1122 Type 2 diabetes mellitus with diabetic chronic kidney disease: Secondary | ICD-10-CM

## 2018-03-16 ENCOUNTER — Other Ambulatory Visit: Payer: Self-pay | Admitting: Family Medicine

## 2018-03-16 DIAGNOSIS — I10 Essential (primary) hypertension: Secondary | ICD-10-CM

## 2018-03-18 ENCOUNTER — Other Ambulatory Visit: Payer: Self-pay | Admitting: Family Medicine

## 2018-03-18 DIAGNOSIS — I10 Essential (primary) hypertension: Secondary | ICD-10-CM

## 2018-03-27 NOTE — Progress Notes (Signed)
De   Subjective:    Patient ID: Sarah Kline, female    DOB: [birth year], 52 y.o.   MRN: [id number]   CC: HTN and DM check   HPI: Hypertension: - Medications: amlodipine 10mg , atenolol 25 mg, hydralazine 10mg  bid  - Compliance: patient was misinformed and has been taking HCTZ instead of atenolol.  - Checking BP at home: no  - Denies any SOB, CP, vision changes, LE edema, medication SEs, or symptoms of hypotension - Diet: unhealthy, eats lots of meat, fried foods, greasy foods, eats salads - Exercise: none, walks up stairs 1x a week   Diabetes: Fasting checks: does not check Post prandial: does not check  Compliance: diet controlled  Diet: see above   Exercise: see above  Eye exam: within last year  Foot exam: 04/22/2017 A1C: 6.8 Meds: diet controlled   Objective:  BP (!) 164/82   Pulse 75   Temp 98.9 F (37.2 C) (Oral)   Ht 5\' 4"  (1.626 m)   Wt 235 lb (106.6 kg)   SpO2 98%   BMI 40.34 kg/m  Vitals and nursing note reviewed  General: well nourished, in no acute distress HEENT: normocephalic, no scleral icterus or conjunctival pallor, no nasal discharge, moist mucous membranes  Cardiac: RRR, clear S1 and S2, no murmurs, rubs, or gallops Respiratory: clear to auscultation bilaterally, no increased work of breathing Abdomen: soft, nontender, nondistended, no masses or organomegaly. Bowel sounds present Extremities: no edema or cyanosis. Warm, well perfused.   Skin: warm and dry, no rashes noted Neuro: alert and oriented, no focal deficits   Assessment & Plan:    HTN (hypertension) Patient today hypertensive to 164/82, however was not taking atenolol. When patient last seen and taking amlodipine, atenolol, and hydralazine patient was normotensive. Patient instructed to take medications as prescribed (at outlined in AVS) and take BP daily and record readings. Informed to call clinic with readings in 2 weeks. Instructed patient that if she cannot take BP readings  at home to then come in to clinic next week for nursing visit. Informed patient to call with any BP that is elevated or if clinic is closed go to emergency room.  -continue amlodipine 10mg , atenolol 25mg , and hydralazine 10mg  bid -record BP daily and call with readings  -order for BP cuff that can verbalize readings placed -follow up in 3 months or sooner if symptoms persist or worsen   Diabetes mellitus (HCC) A1C elevated to 6.8 today. Patient requesting to not start medications today and wants to try diet/lifestyle choices first. Patient states she does not want to take any more medications. Instructed patient to decrease/eliminate carbohydrates in diet. AVS given with diabetic diet guidelines. Instructed patient to follow up in 1 month, at that time if CBG elevated will start treatment. Order for DME glucometer that verbalizes readings placed. Urine microalbumin obtained today. Release of records form signed by patient to obtain retinal eye exam records.   Healthcare maintenance Patient has received colonoscopy but results have not been sent to clinic yet. Patient has signed release of records form to obtain records     Return in about 1 month (around 04/27/2018).   Caroline More, DO, PGY-1

## 2018-03-30 ENCOUNTER — Ambulatory Visit: Payer: Medicaid Other | Admitting: Family Medicine

## 2018-03-30 ENCOUNTER — Encounter: Payer: Self-pay | Admitting: Family Medicine

## 2018-03-30 ENCOUNTER — Other Ambulatory Visit: Payer: Self-pay

## 2018-03-30 VITALS — BP 164/82 | HR 75 | Temp 98.9°F | Ht 64.0 in | Wt 235.0 lb

## 2018-03-30 DIAGNOSIS — I159 Secondary hypertension, unspecified: Secondary | ICD-10-CM

## 2018-03-30 DIAGNOSIS — Z794 Long term (current) use of insulin: Secondary | ICD-10-CM

## 2018-03-30 DIAGNOSIS — E0800 Diabetes mellitus due to underlying condition with hyperosmolarity without nonketotic hyperglycemic-hyperosmolar coma (NKHHC): Secondary | ICD-10-CM | POA: Diagnosis present

## 2018-03-30 DIAGNOSIS — Z0000 Encounter for general adult medical examination without abnormal findings: Secondary | ICD-10-CM

## 2018-03-30 LAB — POCT GLYCOSYLATED HEMOGLOBIN (HGB A1C): Hemoglobin A1C: 6.8

## 2018-03-30 NOTE — Assessment & Plan Note (Addendum)
Patient today hypertensive to 164/82, however was not taking atenolol. When patient last seen and taking amlodipine, atenolol, and hydralazine patient was normotensive. Patient instructed to take medications as prescribed (at outlined in AVS) and take BP daily and record readings. Informed to call clinic with readings in 2 weeks. Instructed patient that if she cannot take BP readings at home to then come in to clinic next week for nursing visit. Informed patient to call with any BP that is elevated or if clinic is closed go to emergency room.  -continue amlodipine 10mg , atenolol 25mg , and hydralazine 10mg  bid -record BP daily and call with readings  -order for BP cuff that can verbalize readings placed -follow up in 3 months or sooner if symptoms persist or worsen

## 2018-03-30 NOTE — Patient Instructions (Addendum)
Diabetes Mellitus and Nutrition When you have diabetes (diabetes mellitus), it is very important to have healthy eating habits because your blood sugar (glucose) levels are greatly affected by what you eat and drink. Eating healthy foods in the appropriate amounts, at about the same times every day, can help you:  Control your blood glucose.  Lower your risk of heart disease.  Improve your blood pressure.  Reach or maintain a healthy weight.  Every person with diabetes is different, and each person has different needs for a meal plan. Your health care provider may recommend that you work with a diet and nutrition specialist (dietitian) to make a meal plan that is best for you. Your meal plan may vary depending on factors such as:  The calories you need.  The medicines you take.  Your weight.  Your blood glucose, blood pressure, and cholesterol levels.  Your activity level.  Other health conditions you have, such as heart or kidney disease.  How do carbohydrates affect me? Carbohydrates affect your blood glucose level more than any other type of food. Eating carbohydrates naturally increases the amount of glucose in your blood. Carbohydrate counting is a method for keeping track of how many carbohydrates you eat. Counting carbohydrates is important to keep your blood glucose at a healthy level, especially if you use insulin or take certain oral diabetes medicines. It is important to know how many carbohydrates you can safely have in each meal. This is different for every person. Your dietitian can help you calculate how many carbohydrates you should have at each meal and for snack. Foods that contain carbohydrates include:  Bread, cereal, rice, pasta, and crackers.  Potatoes and corn.  Peas, beans, and lentils.  Milk and yogurt.  Fruit and juice.  Desserts, such as cakes, cookies, ice cream, and candy.  How does alcohol affect me? Alcohol can cause a sudden decrease in blood  glucose (hypoglycemia), especially if you use insulin or take certain oral diabetes medicines. Hypoglycemia can be a life-threatening condition. Symptoms of hypoglycemia (sleepiness, dizziness, and confusion) are similar to symptoms of having too much alcohol. If your health care provider says that alcohol is safe for you, follow these guidelines:  Limit alcohol intake to no more than 1 drink per day for nonpregnant women and 2 drinks per day for men. One drink equals 12 oz of beer, 5 oz of wine, or 1 oz of hard liquor.  Do not drink on an empty stomach.  Keep yourself hydrated with water, diet soda, or unsweetened iced tea.  Keep in mind that regular soda, juice, and other mixers may contain a lot of sugar and must be counted as carbohydrates.  What are tips for following this plan? Reading food labels  Start by checking the serving size on the label. The amount of calories, carbohydrates, fats, and other nutrients listed on the label are based on one serving of the food. Many foods contain more than one serving per package.  Check the total grams (g) of carbohydrates in one serving. You can calculate the number of servings of carbohydrates in one serving by dividing the total carbohydrates by 15. For example, if a food has 30 g of total carbohydrates, it would be equal to 2 servings of carbohydrates.  Check the number of grams (g) of saturated and trans fats in one serving. Choose foods that have low or no amount of these fats.  Check the number of milligrams (mg) of sodium in one serving. Most people   should limit total sodium intake to less than 2,300 mg per day.  Always check the nutrition information of foods labeled as "low-fat" or "nonfat". These foods may be higher in added sugar or refined carbohydrates and should be avoided.  Talk to your dietitian to identify your daily goals for nutrients listed on the label. Shopping  Avoid buying canned, premade, or processed foods. These  foods tend to be high in fat, sodium, and added sugar.  Shop around the outside edge of the grocery store. This includes fresh fruits and vegetables, bulk grains, fresh meats, and fresh dairy. Cooking  Use low-heat cooking methods, such as baking, instead of high-heat cooking methods like deep frying.  Cook using healthy oils, such as olive, canola, or sunflower oil.  Avoid cooking with butter, cream, or high-fat meats. Meal planning  Eat meals and snacks regularly, preferably at the same times every day. Avoid going long periods of time without eating.  Eat foods high in fiber, such as fresh fruits, vegetables, beans, and whole grains. Talk to your dietitian about how many servings of carbohydrates you can eat at each meal.  Eat 4-6 ounces of lean protein each day, such as lean meat, chicken, fish, eggs, or tofu. 1 ounce is equal to 1 ounce of meat, chicken, or fish, 1 egg, or 1/4 cup of tofu.  Eat some foods each day that contain healthy fats, such as avocado, nuts, seeds, and fish. Lifestyle   Check your blood glucose regularly.  Exercise at least 30 minutes 5 or more days each week, or as told by your health care provider.  Take medicines as told by your health care provider.  Do not use any products that contain nicotine or tobacco, such as cigarettes and e-cigarettes. If you need help quitting, ask your health care provider.  Work with a Social worker or diabetes educator to identify strategies to manage stress and any emotional and social challenges. What are some questions to ask my health care provider?  Do I need to meet with a diabetes educator?  Do I need to meet with a dietitian?  What number can I call if I have questions?  When are the best times to check my blood glucose? Where to find more information:  American Diabetes Association: diabetes.org/food-and-fitness/food  Academy of Nutrition and Dietetics:  PokerClues.dk  [employer] of Diabetes and Digestive and Kidney Diseases (NIH): ContactWire.be Summary  A healthy meal plan will help you control your blood glucose and maintain a healthy lifestyle.  Working with a diet and nutrition specialist (dietitian) can help you make a meal plan that is best for you.  Keep in mind that carbohydrates and alcohol have immediate effects on your blood glucose levels. It is important to count carbohydrates and to use alcohol carefully. This information is not intended to replace advice given to you by your health care provider. Make sure you discuss any questions you have with your health care provider. Document Released: 08/14/2005 Document Revised: 12/22/2016 Document Reviewed: 12/22/2016 Elsevier Interactive Patient Education  [employer].  It was a pleasure seeing you today.   Today we discussed your high blood pressure and your diabetes  For your high blood pressure: please continue to take amlodipine 10mg  daily, atenolol 25 mg daily, and hydralazine 10mg  twice a day. Please check your blood pressures at home every day for the next 2 weeks and call the clinic in 2 weeks with your results.   For your diabetes: your A1C was hight today.  Since you do not want to start medications I will allow you to work on your diet and then follow up in 1 month to recheck your sugars. If you are still high we will start medications.   Please follow up in 1 month or sooner if symptoms persist or worsen. Please call the clinic immediately if you have any concerns.   Our clinic's number is [phone]. Please call with questions or concerns.    Thank you,  Caroline More, DO

## 2018-03-31 LAB — MICROALBUMIN / CREATININE URINE RATIO
Creatinine, Urine: 58.6 mg/dL
Microalb/Creat Ratio: 1195.4 mg/g creat — ABNORMAL HIGH (ref 0.0–30.0)
Microalbumin, Urine: 700.5 ug/mL

## 2018-03-31 NOTE — Assessment & Plan Note (Signed)
Patient has received colonoscopy but results have not been sent to clinic yet. Patient has signed release of records form to obtain records

## 2018-03-31 NOTE — Assessment & Plan Note (Addendum)
A1C elevated to 6.8 today. Patient requesting to not start medications today and wants to try diet/lifestyle choices first. Patient states she does not want to take any more medications. Instructed patient to decrease/eliminate carbohydrates in diet. AVS given with diabetic diet guidelines. Instructed patient to follow up in 1 month, at that time if CBG elevated will start treatment. Order for DME glucometer that verbalizes readings placed. Urine microalbumin obtained today. Release of records form signed by patient to obtain retinal eye exam records.

## 2018-04-05 ENCOUNTER — Other Ambulatory Visit: Payer: Self-pay

## 2018-04-05 DIAGNOSIS — I10 Essential (primary) hypertension: Secondary | ICD-10-CM

## 2018-04-05 MED ORDER — ATENOLOL 25 MG PO TABS: 25.0000 mg | ORAL_TABLET | Freq: Every day | ORAL | 2 refills | Status: DC

## 2018-04-06 ENCOUNTER — Other Ambulatory Visit: Payer: Self-pay | Admitting: Family Medicine

## 2018-04-06 DIAGNOSIS — I10 Essential (primary) hypertension: Secondary | ICD-10-CM

## 2018-04-08 ENCOUNTER — Telehealth: Payer: Self-pay | Admitting: Licensed Clinical Social Worker

## 2018-04-08 NOTE — Progress Notes (Signed)
Type of Service: Clinical Social Work Social work consult from Dr. Tammi Klippel reference providing patient with information on pharmacies that would deliver.   LCSW called patient to assess the need. The following was discussed;patient's needs and barriers; what she has tried ; what works best and review of various options. Patient appreciative of the call.  Updated provided to PCP via in basket message.   Plan: Patient will call pharmacy's provided that will deliver. She will call office if additional assistance is needed. [place] [phone] 2. [place]   Casimer Lanius, LCSW Licensed Clinical Social Worker [facility]   [phone] 12:27 PM

## 2018-04-22 ENCOUNTER — Encounter: Payer: Self-pay | Admitting: Family Medicine

## 2018-04-24 NOTE — Progress Notes (Signed)
Subjective:    Patient ID: Sarah Kline, female    DOB: 08-31-1966, 52 y.o.   MRN: [id number]   CC: DM and HTN follow up  HPI: Hypertension: - Medications: amlodipine 10mg , atenolol 25 mg, hydralazine 10mg  bid  - Compliance: good  - Checking BP at home: no  - Denies any SOB, CP, vision changes, LE edema, medication SEs, or symptoms of hypotension - Diet: no changes since last visit (unhealthy, eats lots of meat, fried foods, greasy foods, eats salads). Husband reports sugary foods and carbs but patient denies.  - Exercise: none - Patient reports she is stressed today. Had to rush to appointment and then was worried that A1C is increasing   Diabetes Fasting checks: no Post prandial no  Compliance: diet controlled but husband reports poor diet  Diet: no changes since last visit (unhealthy, eats lots of meat, fried foods, greasy foods, eats salads). Husband reports sugary foods and carbs but patient denies.  Exercise: none Eye exam: within last year, still awaiting records to be sent  Foot exam: 04/22/17  A1C: 6.8 Symptoms: no symptoms of hypoglycemia. no symptoms of  polyuria, polydipsia. no numbness in extremities, and no foot ulcers/trauma Meds: diet controlled  Per last nephrology note, nephrologist recommending more strict glucose control.   Objective:  BP (!) 158/78   Pulse (!) 55   Temp 98.1 F (36.7 C) (Oral)   Wt 238 lb (108 kg)   LMP 04/28/2018   SpO2 98%   BMI 40.85 kg/m  Vitals and nursing note reviewed  General: well nourished, in no acute distress HEENT: normocephalic, no nasal discharge Neck: supple, non-tender, without lymphadenopathy Cardiac: RRR, clear S1 and S2, no murmurs, rubs, or gallops Respiratory: clear to auscultation bilaterally, no increased work of breathing Abdomen: soft, nontender, nondistended, no masses or organomegaly. Bowel sounds present Extremities: no edema or cyanosis. Warm, well perfused. 2+ radial and PT pulses  bilaterally Skin: warm and dry, no rashes noted Neuro: alert and oriented, no focal deficits   Assessment & Plan:    HTN (hypertension) Currently elevated BP. Repeat BP showing similar results. Patient previously controlled on this regimen. Patient reports that she is stressed today and thinks this may be cause of elevated pressure today. 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 States that husband has been too busy to help check pressures at home. Advised patient to follow up with Dr. Valentina Lucks for ambulatory BP monitoring as it is unclear whether BP is elevated in office alone or at home and office even without times of stress. Patient agreeable to this plan since she is unable to check pressures at home.  -will continue current regimen: amlodipine 10mg , atenolol 25 mg, hydralazine 10mg  bid  -follow up with Dr. Valentina Lucks for ambulatory blood pressure monitoring -DME script given for BP cuff than can verbalize readings given to patient to bring to pharmacy  -follow up in 3 months for HTN management   Diabetes mellitus ([place]) Patient currently diet controlled, however A1C is slowly rising. Patient determined to make lifestyle changes to improve glucose control. Stressed importance of adequate glycemic control given history of CKD. Patient states that although she has not been able to make lifestyle changes at this time she will work on diet and exercise.  AVS with instructions of diabetic diet given to patient. Nephrologist, Dr. Jimmy Footman, has been messaged on Epic to see if there is a specific A1C goal he would recommend for this  patient. Hopeful that next A1C check will again be within goal. DME script for glucose monitor that can verbalize readings given to patient to bring to pharmacy.  -follow up in 2 months for A1C check    Return in about 2 months (around 06/30/2018) for for DM follow up. Please also follow up with Dr. Valentina Lucks, ambulatory BP monitoring  .   Caroline More, DO, PGY-1

## 2018-04-30 ENCOUNTER — Ambulatory Visit: Payer: Medicaid Other | Admitting: Family Medicine

## 2018-04-30 ENCOUNTER — Encounter: Payer: Self-pay | Admitting: Family Medicine

## 2018-04-30 ENCOUNTER — Other Ambulatory Visit: Payer: Self-pay

## 2018-04-30 DIAGNOSIS — I10 Essential (primary) hypertension: Secondary | ICD-10-CM

## 2018-04-30 DIAGNOSIS — E1122 Type 2 diabetes mellitus with diabetic chronic kidney disease: Secondary | ICD-10-CM

## 2018-04-30 NOTE — Patient Instructions (Signed)
  Diet Recommendations for Diabetes   Starchy (carb) foods: Bread, rice, pasta, potatoes, corn, cereal, grits, crackers, bagels, muffins, all baked goods.  (Fruits, milk, and yogurt also have carbohydrate, but most of these foods will not spike your blood sugar as the starchy foods will.)  A few fruits do cause high blood sugars; use small portions of bananas (limit to 1/2 at a time), grapes, watermelon, oranges, and most tropical fruits.    Protein foods: Meat, fish, poultry, eggs, dairy foods, and beans such as pinto and kidney beans (beans also provide carbohydrate).   1. Eat at least 3 meals and 1-2 snacks per day. Never go more than 4-5 hours while awake without eating. Eat breakfast within the first hour of getting up.   2. Limit starchy foods to TWO per meal and ONE per snack. ONE portion of a starchy  food is equal to the following:   - ONE slice of bread (or its equivalent, such as half of a hamburger bun).   - 1/2 cup of a "scoopable" starchy food such as potatoes or rice.   - 15 grams of carbohydrate as shown on food label.  3. Include at every meal: a protein food, a carb food, and vegetables and/or fruit.   - Obtain twice the volume of veg's as protein or carbohydrate foods for both lunch and dinner.   - Fresh or frozen veg's are best.   - Keep frozen veg's on hand for a quick vegetable serving.       It was a pleasure seeing you today.   Today we discussed your Blood pressure and diabetes  For your blood pressure: please continue your medications and meet with Dr. Valentina Lucks for ambulatory blood pressure monitoring.   For your diabetes: please continue to work on diet and exercise. I will contact your nephrologist to discuss diabetes as well.   Please follow up in 2 months or sooner if symptoms persist or worsen. Please call the clinic immediately if you have any concerns.   Our clinic's number is [phone]. Please call with questions or concerns.   Thank you,  Caroline More, DO

## 2018-05-02 NOTE — Assessment & Plan Note (Addendum)
Currently elevated BP. Repeat BP showing similar results. Patient previously controlled on this regimen. Patient reports that she is stressed today and thinks this may be cause of elevated pressure today. 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 States that husband has been too busy to help check pressures at home. Advised patient to follow up with Dr. Valentina Lucks for ambulatory BP monitoring as it is unclear whether BP is elevated in office alone or at home and office even without times of stress. Patient agreeable to this plan since she is unable to check pressures at home.  -will continue current regimen: amlodipine 10mg , atenolol 25 mg, hydralazine 10mg  bid  -follow up with Dr. Valentina Lucks for ambulatory blood pressure monitoring -DME script given for BP cuff than can verbalize readings given to patient to bring to pharmacy  -follow up in 3 months for HTN management

## 2018-05-02 NOTE — Assessment & Plan Note (Addendum)
Patient currently diet controlled, however A1C is slowly rising. Patient determined to make lifestyle changes to improve glucose control. Stressed importance of adequate glycemic control given history of CKD. Patient states that although she has not been able to make lifestyle changes at this time she will work on diet and exercise.  AVS with instructions of diabetic diet given to patient. Nephrologist, Dr. Jimmy Footman, has been messaged on Epic to see if there is a specific A1C goal he would recommend for this patient. Hopeful that next A1C check will again be within goal. DME script for glucose monitor that can verbalize readings given to patient to bring to pharmacy.  -follow up in 2 months for A1C check

## 2018-05-07 ENCOUNTER — Other Ambulatory Visit: Payer: Self-pay | Admitting: Family Medicine

## 2018-05-07 DIAGNOSIS — D509 Iron deficiency anemia, unspecified: Secondary | ICD-10-CM

## 2018-05-13 ENCOUNTER — Ambulatory Visit: Payer: Medicaid Other | Admitting: Pharmacist

## 2018-05-17 ENCOUNTER — Other Ambulatory Visit: Payer: Self-pay | Admitting: Family Medicine

## 2018-05-17 DIAGNOSIS — I10 Essential (primary) hypertension: Secondary | ICD-10-CM

## 2018-05-20 ENCOUNTER — Encounter: Payer: Self-pay | Admitting: Pharmacist

## 2018-05-20 ENCOUNTER — Ambulatory Visit: Payer: Medicaid Other | Admitting: Pharmacist

## 2018-05-20 VITALS — BP 158/89 | HR 56 | Ht 65.5 in | Wt 240.4 lb

## 2018-05-20 DIAGNOSIS — I10 Essential (primary) hypertension: Secondary | ICD-10-CM

## 2018-05-20 MED ORDER — CARVEDILOL 3.125 MG PO TABS: 3.1250 mg | ORAL_TABLET | Freq: Two times a day (BID) | ORAL | 3 refills | Status: DC

## 2018-05-20 NOTE — Assessment & Plan Note (Signed)
Hypertension longstanding currently uncontrolled on in setting of no medication today in office. BP Goal = <130/80 mmHg. Patient is adherent with current medications. Of note, patient following with Dr. Jimmy Footman as well.  -Continued amlodipine 10 mg and hydralazine 10 mg BID. 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  -F/u labs ordered - BMET, TSH  -Counseled on lifestyle modifications for blood pressure control including reduced dietary sodium, increased exercise, adequate sleep -Deferring ambulatory BP monitoring given changes made in office today and f/u planned with Dr. Jimmy Footman.

## 2018-05-20 NOTE — Progress Notes (Signed)
Patient ID: Sarah Kline, female   DOB: 01/20/1966, 51 y.o.   MRN: [id number] Reviewed: Agree with Dr. Koval's documentation and management. 

## 2018-05-20 NOTE — Patient Instructions (Addendum)
Good to meet you and your daughter.   STOP the atenolol START carvedilol 3.125 mg twice daily  We are getting some labs today to look at your kidneys/electrolytes and thyroid.   Come back to see [place] in ~ 3weeks. Follow up with Dr. Warrick Parisian on Monday and let him know you had labs drawn here. Follow up with your primary care provider to talk about mood if you are interested.

## 2018-05-20 NOTE — Progress Notes (Addendum)
   S:    Patient arrives today assisted by her daughter, Sarah Kline. Presents to the clinic for hypertension evaluation, counseling, and management. Patient was referred by Dr. Tammi Klippel on 04/30/2018.  Patient was last seen by Primary Care Provider on 04/30/2018.   Patient reports adherence with medications, but has not taken any medications today. Patient also endorsed some depression and low mood that she feels is situational. Denies SI/HI. Not interested in medication at this time.   Current BP Medications include:  Amlodipine 10 mg daily, atenolol 25 mg daily, hydralazine 10 mg BID   Antihypertensives tried in the past include: HCTZ and lisinopril (held for Scr bump)  Dietary habits include: patient denies caffeine intake, but does eat processed meats a few times a week Exercise habits include: minimal exercise and activity due to blindness Family / Social history: endorses drinking 1 alcoholic beverage once weekly  ASCVD risk factors include: DM, HTN, TIA history  Home BP readings: unknown  O:  Physical Exam  Constitutional: She appears well-developed and well-nourished.     Review of Systems  Neurological: Negative for dizziness and headaches.  Psychiatric/Behavioral: Positive for depression.  All other systems reviewed and are negative.    Last 3 Office BP readings: BP Readings from Last 3 Encounters:  05/20/18 (!) 158/89  04/30/18 (!) 158/78  03/30/18 (!) 164/82  Pulse was 56 during the visit.  BMET    Component Value Date/Time   NA 139 10/14/2017 1035   K 5.2 10/14/2017 1035   CL 106 10/14/2017 1035   CO2 17 (L) 10/14/2017 1035   GLUCOSE 109 (H) 10/14/2017 1035   GLUCOSE 113 (H) 02/13/2017 1008   BUN 40 (H) 10/14/2017 1035   CREATININE 2.07 (H) 10/14/2017 1035   CREATININE 1.73 (H) 02/13/2017 1008   CALCIUM 9.1 10/14/2017 1035   GFRNONAA 27 (L) 10/14/2017 1035   GFRNONAA 33 (L) 02/13/2017 1008   GFRAA 31 (L) 10/14/2017 1035   GFRAA 39 (L) 02/13/2017 1008    A/P: Hypertension longstanding currently uncontrolled on in setting of no medication today in office. BP Goal = <130/80 mmHg. Patient is adherent with current medications. Of note, patient following with Dr. Jimmy Footman as well.  -Continued amlodipine 10 mg and hydralazine 10 mg BID. 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  -F/u labs ordered - BMET, TSH  -Counseled on lifestyle modifications for blood pressure control including reduced dietary sodium, increased exercise, adequate sleep -Deferring ambulatory BP monitoring given changes made in office today and f/u planned with Dr. Jimmy Footman.   Complaints of depressed mood - no SI/HI, not interested in pharmacotherapy. Recommended that she follow up with her PCP about this issue.    Results reviewed and written information provided.   Total time in face-to-face counseling 30 minutes.   F/U Clinic Visit in 2-3 weeks.  Patient seen with Nida Boatman, PharmD, PGY1 Pharmacy Resident and Deirdre Pippins, PharmD, BCPS, PGY2 Pharmacy Resident.    BMET and TSH - stable and similar to previous.  No change today based on results.

## 2018-05-21 LAB — BASIC METABOLIC PANEL
BUN / CREAT RATIO: 15 (ref 9–23)
BUN: 41 mg/dL — AB (ref 6–24)
CO2: 19 mmol/L — ABNORMAL LOW (ref 20–29)
Calcium: 9 mg/dL (ref 8.7–10.2)
Chloride: 109 mmol/L — ABNORMAL HIGH (ref 96–106)
Creatinine, Ser: 2.66 mg/dL — ABNORMAL HIGH (ref 0.57–1.00)
GFR calc non Af Amer: 20 mL/min/{1.73_m2} — ABNORMAL LOW (ref >59)
GFR, EST AFRICAN AMERICAN: 23 mL/min/{1.73_m2} — AB (ref >59)
GLUCOSE: 118 mg/dL — AB (ref 65–99)
Potassium: 5.2 mmol/L (ref 3.5–5.2)
SODIUM: 140 mmol/L (ref 134–144)

## 2018-05-21 LAB — TSH: TSH: 1.03 u[IU]/mL (ref 0.450–4.500)

## 2018-06-10 ENCOUNTER — Ambulatory Visit (INDEPENDENT_AMBULATORY_CARE_PROVIDER_SITE_OTHER): Payer: Medicaid Other | Admitting: Pharmacist

## 2018-06-10 ENCOUNTER — Encounter: Payer: Self-pay | Admitting: Pharmacist

## 2018-06-10 VITALS — BP 144/76 | HR 65 | Ht 64.0 in | Wt 238.0 lb

## 2018-06-10 DIAGNOSIS — I10 Essential (primary) hypertension: Secondary | ICD-10-CM | POA: Diagnosis not present

## 2018-06-10 NOTE — Patient Instructions (Signed)
Good to see you again!   We need to know how your blood pressure is running after you take your medicines.   Check your blood pressure once a day  Come back to see [place] in 1 week and bring in your blood pressure cuff.

## 2018-06-10 NOTE — Progress Notes (Signed)
Patient ID: Sarah Kline, female   DOB: 08-22-1966, 52 y.o.   MRN: [id number] Reviewed: Agree with Dr. Graylin Shiver documentation and management.

## 2018-06-10 NOTE — Progress Notes (Signed)
   S:    Patient arrives in good spirits, presenting with husband, Chip, ambulating with his assistance.    Presents to the clinic for hypertension evaluation, counseling, and management. Patient was referred by Dr. Tammi Klippel on 04/30/2018.  Patient was last seen by Primary Care Provider on 04/30/2018. Last Rx Clinic visit 05/20/18 - at that time atenolol was changed to low-dose carvedilol.  Patient reports she has been feeling well. Denies dizziness, HA, chest pain, falls. Reports continued daytime sleepiness. Endorses recurrent pain behind her left eye (has h/o surgery on this eye per patient) last week but denies issues today. States she was told it was a migraine in the past. Reports she has appointment with Dr. Tammi Klippel for this complaint.   Reports she was diagnosed with orthostatic hypotension at Dr. Deterding's (nephrology) but no changes were made to medications at that time. She reports her renal function is stable per Dr. Jimmy Footman.   Patient reports adherence with medications however states she has NOT taken any meds today.   Current BP Medications include:  carvedilol 3.125 mg BID, amlodipine 10 mg daily, hydralazine 10 mg BID   Antihypertensives tried in the past include: HCTZ and lisinopril (held for Scr bump)  Dietary habits include: patient denies caffeine intake, but does eat processed meats a few times a week Exercise habits include: minimal exercise and activity due to blindness Family / Social history: endorses drinking 1 alcoholic beverage once weekly  ASCVD risk factors include: DM, HTN, TIA history  Home BP readings: unknown but does have cuff at home    O:  Physical Exam  Constitutional: She appears well-developed and well-nourished.     Review of Systems  Eyes: Positive for pain (left eye, recurrent).  All other systems reviewed and are negative.   Last 3 Office BP readings: BP Readings from Last 3 Encounters:  06/10/18 (!) 144/76  05/20/18 (!) 158/89    04/30/18 (!) 158/78    BMET    Component Value Date/Time   NA 140 05/20/2018 1028   K 5.2 05/20/2018 1028   CL 109 (H) 05/20/2018 1028   CO2 19 (L) 05/20/2018 1028   GLUCOSE 118 (H) 05/20/2018 1028   GLUCOSE 113 (H) 02/13/2017 1008   BUN 41 (H) 05/20/2018 1028   CREATININE 2.66 (H) 05/20/2018 1028   CREATININE 1.73 (H) 02/13/2017 1008   CALCIUM 9.0 05/20/2018 1028   GFRNONAA 20 (L) 05/20/2018 1028   GFRNONAA 33 (L) 02/13/2017 1008   GFRAA 23 (L) 05/20/2018 1028   GFRAA 39 (L) 02/13/2017 1008    Renal function: Estimated Creatinine Clearance: 29.3 mL/min (A) (by C-G formula based on SCr of 2.66 mg/dL (H)).  A/P: Discussed case with Dr. Andria Frames. With his permission, made the following changes: Hypertension longstanding currently uncontrolled on current medications. BP Goal = <130/80 mmHg. Patient is adherent with current medications however did not take them today. Seems to be tolerating change in beta blocker well.  -No changes made to medications today -Counseled on lifestyle modifications for blood pressure control including reduced dietary sodium, increased exercise, adequate sleep -Asked patient to measure BP daily x1 week and return for close f/u  Results reviewed and written information provided.   Total time in face-to-face counseling 30 minutes.   F/U Clinic Visit in 1 week.    Carlean Jews, Pharm.D., BCPS PGY2 Ambulatory Care Pharmacy Resident Phone: [phone]

## 2018-06-10 NOTE — Assessment & Plan Note (Signed)
Hypertension longstanding currently uncontrolled on current medications. BP Goal = <130/80 mmHg. Patient is adherent with current medications however did not take them today. Seems to be tolerating change in beta blocker well.  -No changes made to medications today -Counseled on lifestyle modifications for blood pressure control including reduced dietary sodium, increased exercise, adequate sleep -Asked patient to measure BP daily x1 week and return for close f/u

## 2018-06-10 NOTE — Progress Notes (Signed)
   Subjective:    Patient ID: Sarah Kline, female    DOB: 09-Dec-1965, 52 y.o.   MRN: [id number]  HPI    Review of Systems     Objective:   Physical Exam        Assessment & Plan:

## 2018-06-18 ENCOUNTER — Ambulatory Visit: Payer: Medicaid Other | Admitting: Pharmacist

## 2018-06-18 ENCOUNTER — Other Ambulatory Visit: Payer: Self-pay | Admitting: Family Medicine

## 2018-06-18 ENCOUNTER — Encounter: Payer: Self-pay | Admitting: Pharmacist

## 2018-06-18 DIAGNOSIS — I10 Essential (primary) hypertension: Secondary | ICD-10-CM

## 2018-06-18 MED ORDER — HYDRALAZINE HCL 10 MG PO TABS: 10.0000 mg | ORAL_TABLET | Freq: Three times a day (TID) | ORAL | 3 refills | Status: DC

## 2018-06-18 MED ORDER — AMLODIPINE BESYLATE 10 MG PO TABS: 10.0000 mg | ORAL_TABLET | Freq: Every evening | ORAL | Status: DC

## 2018-06-18 MED ORDER — CLONIDINE HCL 0.1 MG PO TABS: 0.1000 mg | ORAL_TABLET | Freq: Two times a day (BID) | ORAL | 3 refills | Status: DC

## 2018-06-18 NOTE — Assessment & Plan Note (Signed)
Hypertension longstanding with CKD currently uncontrolled on current medications. BP Goal: <130/80, however, caution should be given to patient's fall risk d/t blindness. Patient is mostly adherent to current medications and is tolerating well.  - Change amlodipine to evening administration to target dipping effect, improved cardiovascular outcomes - Increase hydralazine 10 mg to TID dosing due to pharmacokinetic half-life - Add clonidine 0.1 mg BID, with consideration to increase to TID dosing moving forward. 24 hour ambulatory BP monitoring may be warranted in the future. - Counseled patient to consider increased exercise/physical activity to improve B

## 2018-06-18 NOTE — Progress Notes (Signed)
   S:    Patient arrives in good spirits with her husband, ambulating with his assistance. Presents to the clinic for hypertension evaluation, counseling, and management. Patient was referred by Dr. Tammi Klippel on 04/30/2018.  Patient was last seen by Primary Care Provider on 04/30/2018. Last Rx Clinic visit 06/10/2018- no changes were made at that time, as patient had not taken her antihypertensives that morning.   She reports continued daytime sleepiness, but overall feeling well. No dizziness/lightheadnessness. She notes that her BP are more controlled in the few hours after taking her medications, but the effect appears to wear off.  Medication compliance is reported to be mostly adherent, occasionally missing evening doses. Husband endorses that she is able to manage her antihypertensives by sorting bottles into bags based on dosing frequency.  Current BP Medications include:  Amlodipine 10 mg daily (morning), carvedilol 3.125 mg BID, hydralazine 10 mg BID  Antihypertensives tried in the past include: HCTZ and lisinopril (held for SCr bump)  Dietary habits include: notes that she typically avoids canned foods in favor of frozen, but will occasionally eat fast food/restaurant food that is salted.   O:   Physical Exam  Constitutional: She appears well-developed and well-nourished.  Musculoskeletal: She exhibits edema (trace bilateral lower extremity edema).  Vitals reviewed.  Review of Systems  Constitutional: Positive for malaise/fatigue (excessive daytime sleepiness).   Last 3 Office BP readings: BP Readings from Last 3 Encounters:  06/18/18 (!) 142/82  06/11/18 (!) 146/72  06/10/18 (!) 144/76    Today's Office BP reading: 148/86 mmHg, 142/82 on recheck  Home Readings: Unable to confirm readings on home meter, but patient reports systolics [id number] over diastolics [id number]  BMET    Component Value Date/Time   NA 140 05/20/2018 1028   K 5.2 05/20/2018 1028   CL 109 (H) 05/20/2018  1028   CO2 19 (L) 05/20/2018 1028   GLUCOSE 118 (H) 05/20/2018 1028   GLUCOSE 113 (H) 02/13/2017 1008   BUN 41 (H) 05/20/2018 1028   CREATININE 2.66 (H) 05/20/2018 1028   CREATININE 1.73 (H) 02/13/2017 1008   CALCIUM 9.0 05/20/2018 1028   GFRNONAA 20 (L) 05/20/2018 1028   GFRNONAA 33 (L) 02/13/2017 1008   GFRAA 23 (L) 05/20/2018 1028   GFRAA 39 (L) 02/13/2017 1008    A/P: Hypertension longstanding with CKD currently uncontrolled on current medications. BP Goal: <130/80, however, caution should be given to patient's fall risk d/t blindness. Patient is mostly adherent to current medications and is tolerating well.  - Change amlodipine to evening administration to target dipping effect, improved cardiovascular outcomes - Increase hydralazine 10 mg to TID dosing due to pharmacokinetic half-life - Add clonidine 0.1 mg BID, with consideration to increase to TID dosing moving forward. 24 hour ambulatory BP monitoring may be warranted in the future. - Counseled patient to consider increased exercise/physical activity to improve BP, weight, and fatigue     Results reviewed and written information provided.  Total time in face-to-face counseling 40 minutes.   F/U Clinic Visit with Rx clinic in 2-3 weeks.  Patient seen with Doristine Mango, DO, PGY1 Family Medicine resident and Catie Darnelle Maffucci, PharmD, PGY2 Pharmacy Resident

## 2018-06-18 NOTE — Progress Notes (Signed)
Patient ID: Sarah Kline, female   DOB: [birth year], 52 y.o.   MRN: [id number] Reviewed: Agree with Dr. Graylin Shiver documentation and management.

## 2018-06-18 NOTE — Patient Instructions (Addendum)
It was great to see you again today!   We're going to make a couple of changes today:   1) Start taking AMLODIPINE in the evening, instead of the morning 2) We are going to add a third dose of HYDRALAZINE, so now you will take it THREE TIMES DAILY 3) We are going to add a new medication called CLONIDINE and you will take it TWICE DAILY   Keep checking your blood pressures once a day, and bring the results to our next appointment. Let's see you again in 2-3 weeks.

## 2018-06-24 NOTE — Progress Notes (Signed)
   Subjective:    Patient ID: Sarah Kline, female    DOB: February 23, 1966, 52 y.o.   MRN: [id number]   CC: diabetes   HPI: Diabetes  Fasting checks: does not check as does not have meter that will verbalize readings  Post prandial does not check  Compliance: diet controlled  Diet: has not changed much, eliminated salt and pork  Exercise: none, went up steps twice today. Notes 3lb weight loss  Eye exam: needs to see eye doctor  Foot exam: will obtain today  A1C: 6.8 on 03/30/18.  Symptoms: no symptoms of hypoglycemia. no symptoms of  polyuria, polydipsia. no numbness in extremities, and no foot ulcers/trauma Meds: diet controlled   Hypertension: - Medications: amlodipine 10mg  qhs, hydralazine 10mg  tid, clonidine 0.1mg  bid - Compliance: good - Checking BP at home: no - Denies any SOB, CP, vision changes, LE edema, medication SEs, or symptoms of hypotension - Diet: see above - Exercise: see above    Objective:  BP 110/60 (BP Location: Right Arm, Patient Position: Sitting, Cuff Size: Normal)   Pulse (!) 59   Temp 99.1 F (37.3 C) (Oral)   Ht 5\' 4"  (1.626 m)   Wt 238 lb 6.4 oz (108.1 kg)   SpO2 97%   BMI 40.92 kg/m  Vitals and nursing note reviewed  General: well nourished, in no acute distress HEENT: normocephalic, moist mucous membranes Neck: supple, non-tender, without lymphadenopathy Cardiac: RRR, clear S1 and S2, no murmurs, rubs, or gallops Respiratory: clear to auscultation bilaterally, no increased work of breathing Abdomen: soft, nontender, nondistended, no masses or organomegaly. Bowel sounds present Extremities: no edema or cyanosis. Warm, well perfused. 2+ radial and PT pulses bilaterally Foot exam: no deformities, ulceration, or skin breakdown. Intact to touch and monofilament testing. PT and DP pulses intact  Skin: warm and dry, no rashes noted Neuro: alert and oriented, no focal deficits   Assessment & Plan:    Diabetes mellitus ([place]) Patient  currently not on any medications at this time.  Although A1c's are below 7 per discussions with Dr. Lynnell Chad (her nephrologist) A1c is not as accurate with patient's history of declining GFR is but before ESRD.  Patient unable to check sugars at home as patient is blind and unable to obtain glucometer that will verbalize readings due to insurance issues.  Will obtain fructosamine levels to help determine if patient is truly uncontrolled or is able to maintain diet therapy.  If fructosamine levels are elevated can consider starting Januvia to help control sugars as patient unable to use metformin and had low sugars when on sulfonylureas.  Patient is very hesitant to start medications.  Patient actively refusing medications today as she would first like to see what fructosamine levels are.  Discussed in length that A1c may not be adequately checking her patient's sugars as she has declining GFR is.  Encourage patient to improve diet and limit carbohydrates and sugars.  Patient still has not cut those out of her diet. -Obtain fructosamine -Follow-up in 1 month -Encouraged healthy diet and daily exercise -Rx for glucometer, strips, and lancets given to patient as patient has no way to check sugars if feeling hypoglycemic.  Instructed patient that if she feels hypoglycemic she should have her husband check sugars to ensure her sugars are not dropping too low    Return in about 1 month (around 07/23/2018).   Caroline More, DO, PGY-2

## 2018-06-25 ENCOUNTER — Telehealth: Payer: Self-pay

## 2018-06-25 ENCOUNTER — Ambulatory Visit: Payer: Medicaid Other | Admitting: Family Medicine

## 2018-06-25 VITALS — BP 110/60 | HR 59 | Temp 99.1°F | Ht 64.0 in | Wt 238.4 lb

## 2018-06-25 DIAGNOSIS — E1122 Type 2 diabetes mellitus with diabetic chronic kidney disease: Secondary | ICD-10-CM | POA: Diagnosis present

## 2018-06-25 MED ORDER — ACCU-CHEK SOFTCLIX LANCET DEV KIT: 1.0000 | PACK | Freq: Every day | 0 refills | Status: DC

## 2018-06-25 MED ORDER — GLUCOSE BLOOD VI STRP: ORAL_STRIP | 12 refills | Status: DC

## 2018-06-25 MED ORDER — ACCU-CHEK AVIVA PLUS W/DEVICE KIT: 1.0000 | PACK | Freq: Every day | 0 refills | Status: DC

## 2018-06-25 MED ORDER — ACCU-CHEK SOFTCLIX LANCETS MISC: 12 refills | Status: DC

## 2018-06-25 NOTE — Telephone Encounter (Signed)
Pharmacy called nurse line stating they need a daily limit on pts accu chek lancets and test strips. Essentially, how many times a day is she testing. Please advise.

## 2018-06-25 NOTE — Patient Instructions (Addendum)
Diabetes Mellitus and Nutrition When you have diabetes (diabetes mellitus), it is very important to have healthy eating habits because your blood sugar (glucose) levels are greatly affected by what you eat and drink. Eating healthy foods in the appropriate amounts, at about the same times every day, can help you:  Control your blood glucose.  Lower your risk of heart disease.  Improve your blood pressure.  Reach or maintain a healthy weight.  Every person with diabetes is different, and each person has different needs for a meal plan. Your health care provider may recommend that you work with a diet and nutrition specialist (dietitian) to make a meal plan that is best for you. Your meal plan may vary depending on factors such as:  The calories you need.  The medicines you take.  Your weight.  Your blood glucose, blood pressure, and cholesterol levels.  Your activity level.  Other health conditions you have, such as heart or kidney disease.  How do carbohydrates affect me? Carbohydrates affect your blood glucose level more than any other type of food. Eating carbohydrates naturally increases the amount of glucose in your blood. Carbohydrate counting is a method for keeping track of how many carbohydrates you eat. Counting carbohydrates is important to keep your blood glucose at a healthy level, especially if you use insulin or take certain oral diabetes medicines. It is important to know how many carbohydrates you can safely have in each meal. This is different for every person. Your dietitian can help you calculate how many carbohydrates you should have at each meal and for snack. Foods that contain carbohydrates include:  Bread, cereal, rice, pasta, and crackers.  Potatoes and corn.  Peas, beans, and lentils.  Milk and yogurt.  Fruit and juice.  Desserts, such as cakes, cookies, ice cream, and candy.  How does alcohol affect me? Alcohol can cause a sudden decrease in blood  glucose (hypoglycemia), especially if you use insulin or take certain oral diabetes medicines. Hypoglycemia can be a life-threatening condition. Symptoms of hypoglycemia (sleepiness, dizziness, and confusion) are similar to symptoms of having too much alcohol. If your health care provider says that alcohol is safe for you, follow these guidelines:  Limit alcohol intake to no more than 1 drink per day for nonpregnant women and 2 drinks per day for men. One drink equals 12 oz of beer, 5 oz of wine, or 1 oz of hard liquor.  Do not drink on an empty stomach.  Keep yourself hydrated with water, diet soda, or unsweetened iced tea.  Keep in mind that regular soda, juice, and other mixers may contain a lot of sugar and must be counted as carbohydrates.  What are tips for following this plan? Reading food labels  Start by checking the serving size on the label. The amount of calories, carbohydrates, fats, and other nutrients listed on the label are based on one serving of the food. Many foods contain more than one serving per package.  Check the total grams (g) of carbohydrates in one serving. You can calculate the number of servings of carbohydrates in one serving by dividing the total carbohydrates by 15. For example, if a food has 30 g of total carbohydrates, it would be equal to 2 servings of carbohydrates.  Check the number of grams (g) of saturated and trans fats in one serving. Choose foods that have low or no amount of these fats.  Check the number of milligrams (mg) of sodium in one serving. Most people   should limit total sodium intake to less than 2,300 mg per day.  Always check the nutrition information of foods labeled as "low-fat" or "nonfat". These foods may be higher in added sugar or refined carbohydrates and should be avoided.  Talk to your dietitian to identify your daily goals for nutrients listed on the label. Shopping  Avoid buying canned, premade, or processed foods. These  foods tend to be high in fat, sodium, and added sugar.  Shop around the outside edge of the grocery store. This includes fresh fruits and vegetables, bulk grains, fresh meats, and fresh dairy. Cooking  Use low-heat cooking methods, such as baking, instead of high-heat cooking methods like deep frying.  Cook using healthy oils, such as olive, canola, or sunflower oil.  Avoid cooking with butter, cream, or high-fat meats. Meal planning  Eat meals and snacks regularly, preferably at the same times every day. Avoid going long periods of time without eating.  Eat foods high in fiber, such as fresh fruits, vegetables, beans, and whole grains. Talk to your dietitian about how many servings of carbohydrates you can eat at each meal.  Eat 4-6 ounces of lean protein each day, such as lean meat, chicken, fish, eggs, or tofu. 1 ounce is equal to 1 ounce of meat, chicken, or fish, 1 egg, or 1/4 cup of tofu.  Eat some foods each day that contain healthy fats, such as avocado, nuts, seeds, and fish. Lifestyle   Check your blood glucose regularly.  Exercise at least 30 minutes 5 or more days each week, or as told by your health care provider.  Take medicines as told by your health care provider.  Do not use any products that contain nicotine or tobacco, such as cigarettes and e-cigarettes. If you need help quitting, ask your health care provider.  Work with a Social worker or diabetes educator to identify strategies to manage stress and any emotional and social challenges. What are some questions to ask my health care provider?  Do I need to meet with a diabetes educator?  Do I need to meet with a dietitian?  What number can I call if I have questions?  When are the best times to check my blood glucose? Where to find more information:  American Diabetes Association: diabetes.org/food-and-fitness/food  Academy of Nutrition and Dietetics:  PokerClues.dk  [employer] of Diabetes and Digestive and Kidney Diseases (NIH): ContactWire.be Summary  A healthy meal plan will help you control your blood glucose and maintain a healthy lifestyle.  Working with a diet and nutrition specialist (dietitian) can help you make a meal plan that is best for you.  Keep in mind that carbohydrates and alcohol have immediate effects on your blood glucose levels. It is important to count carbohydrates and to use alcohol carefully. This information is not intended to replace advice given to you by your health care provider. Make sure you discuss any questions you have with your health care provider. Document Released: 08/14/2005 Document Revised: 12/22/2016 Document Reviewed: 12/22/2016 Elsevier Interactive Patient Education  [employer].  It was a pleasure seeing you today.   Today we discussed diabetes  For your diabetes: I will check fructosamine levels today. If they are high I plan to start a new medication called Januvia. Take this once daily.   Please follow up in 1 month or sooner if symptoms persist or worsen. Please call the clinic immediately if you have any concerns.   For your blood pressure: continue your medications and see  Dr. Valentina Lucks. Please call if your blood pressure is either too high or too low.   Our clinic's number is [phone]. Please call with questions or concerns.   Please go to the emergency room if **  Thank you,  Caroline More, DO

## 2018-06-25 NOTE — Telephone Encounter (Signed)
Called pharmacy and clarified. No further action required  Caroline More, DO, PGY-2 [facility] 06/25/2018 4:45 PM

## 2018-06-26 LAB — FRUCTOSAMINE: Fructosamine: 305 umol/L — ABNORMAL HIGH (ref 0–285)

## 2018-06-28 NOTE — Assessment & Plan Note (Signed)
Patient currently not on any medications at this time.  Although A1c's are below 7 per discussions with Dr. Lynnell Chad (her nephrologist) A1c is not as accurate with patient's history of declining GFR is but before ESRD.  Patient unable to check sugars at home as patient is blind and unable to obtain glucometer that will verbalize readings due to insurance issues.  Will obtain fructosamine levels to help determine if patient is truly uncontrolled or is able to maintain diet therapy.  If fructosamine levels are elevated can consider starting Januvia to help control sugars as patient unable to use metformin and had low sugars when on sulfonylureas.  Patient is very hesitant to start medications.  Patient actively refusing medications today as she would first like to see what fructosamine levels are.  Discussed in length that A1c may not be adequately checking her patient's sugars as she has declining GFR is.  Encourage patient to improve diet and limit carbohydrates and sugars.  Patient still has not cut those out of her diet. -Obtain fructosamine -Follow-up in 1 month -Encouraged healthy diet and daily exercise -Rx for glucometer, strips, and lancets given to patient as patient has no way to check sugars if feeling hypoglycemic.  Instructed patient that if she feels hypoglycemic she should have her husband check sugars to ensure her sugars are not dropping too low

## 2018-06-30 ENCOUNTER — Other Ambulatory Visit: Payer: Self-pay | Admitting: Family Medicine

## 2018-06-30 MED ORDER — SITAGLIPTIN PHOSPHATE 25 MG PO TABS: 25.0000 mg | ORAL_TABLET | Freq: Every day | ORAL | 2 refills | Status: DC

## 2018-06-30 NOTE — Progress Notes (Signed)
Patient with elevated fructosamine level to 305 which is above normal range.  Discussed patient with Dr. Valentina Lucks to clarify interpretation of fructosamine level.  Patient is out of the normal range will need medication management to help control blood sugars.  Discussed starting Januvia with patient who is agreeable to medications.  Patient will follow-up in 1 month to recheck fructosamine level.  Rx sent to pharmacy and patient aware.  Dalphine Handing, PGY-2 [facility] 06/30/2018 1:43 PM

## 2018-07-07 ENCOUNTER — Telehealth: Payer: Self-pay

## 2018-07-07 NOTE — Telephone Encounter (Signed)
Prior approval for Januvia completed via [place] [employer].  Med approved for 07/07/18 - 07/02/19.  Prior approval # [id number].  [facility] informed.  Danley Danker, RN [facility] RN)

## 2018-07-07 NOTE — Telephone Encounter (Signed)
Received fax from [place] requesting prior authorization of Januvia.  Form placed in PCP's box for completion along with Medicaid formulary. Will also route message to clinical pharmacist because patient has appt tomorrow with him.  Danley Danker, RN [facility] RN)

## 2018-07-07 NOTE — Telephone Encounter (Signed)
Completed PA info in [place] for Januvia.  Status pending.  Will recheck status in 24 hours. Danley Danker, RN [facility] RN)

## 2018-07-07 NOTE — Telephone Encounter (Signed)
Given that patient may not use metformin due to CKD and was unable to tolerate glipizide due to hypoglycemia prior authorization for Januvia completed and given to RN clinic.  Dalphine Handing, PGY-2 [facility] 07/07/2018 11:50 AM

## 2018-07-08 ENCOUNTER — Ambulatory Visit: Payer: Medicaid Other | Admitting: Pharmacist

## 2018-07-08 ENCOUNTER — Encounter: Payer: Self-pay | Admitting: Pharmacist

## 2018-07-08 DIAGNOSIS — I10 Essential (primary) hypertension: Secondary | ICD-10-CM | POA: Diagnosis not present

## 2018-07-08 DIAGNOSIS — E1122 Type 2 diabetes mellitus with diabetic chronic kidney disease: Secondary | ICD-10-CM | POA: Diagnosis not present

## 2018-07-08 NOTE — Patient Instructions (Addendum)
It was nice seeing you today!  1. Continue your current blood pressure medication.  2. Pick up and start Januvia once daily. 3. Start checking your blood sugars at home daily.  4. Start check your blood pressure daily.  5. Follow up on 8/26 with Dr. Tammi Klippel.

## 2018-07-08 NOTE — Assessment & Plan Note (Signed)
Hypertension longstanding currently higher than goal today.Blood pressure elevated at clinic visit today 142/80, however was measured to be 110/60 in clinic on 7/26.  BP Goal <130/80 mmHg. Patient is adherent with current blood pressure medications.  -Continue hydralazine 10 mg TID, clonidine 0.1 mg BID, carvedilol 3.125 mg BID, and amlodipine 10 mg every evening -Counseled on lifestyle modifications for blood pressure control including reduced dietary sodium, increased exercise, adequate sleep -Start checking home blood pressure daily.

## 2018-07-08 NOTE — Progress Notes (Signed)
   S:    Patient arrives in good spirits ambulating with assistance from her husband. Presents to the clinic for hypertension evaluation, counseling, and management. Patient was referred by Dr. Tammi Klippel on 06/25/18.  Patient was last seen by Primary Care Provider on 06/25/2018. Last Rx Clinic visit 06/18/18 - at that time we managed her blood pressure regimen due to uncontrolled hypertension.  Patient reports adherence with her blood pressure medications. Patient reports trying to pick up Januvia and her blood glucose meter from the pharmacy but not being able to. The prior authorization has just been approved, so she will be able to pick up her medications today.  Patient reports feeling lightheaded at times, usually after getting up after laying down but reports this doesn't happen very often.    Patient reports she is in a hurry today because her child is in the emergency room. She reports this after her in clinic blood pressure shows to be slightly elevated at 142/80.   Current BP Medications include: Carvedilol 3.125mg  BID, Hydralazine 10 mg TID, amlodipine 10 mg every evening, clonidine 0.1 BID   Antihypertensives tried in the past include: Lisinopril and HCTZ stopped after sharp increase in Scr.   O:  Physical Exam  Constitutional: She appears well-developed and well-nourished.  Vitals reviewed.    Review of Systems  All other systems reviewed and are negative.   Last 3 Office BP readings: BP Readings from Last 3 Encounters:  06/25/18 110/60  06/18/18 (!) 142/82  06/11/18 (!) 146/72    BMET    Component Value Date/Time   NA 140 05/20/2018 1028   K 5.2 05/20/2018 1028   CL 109 (H) 05/20/2018 1028   CO2 19 (L) 05/20/2018 1028   GLUCOSE 118 (H) 05/20/2018 1028   GLUCOSE 113 (H) 02/13/2017 1008   BUN 41 (H) 05/20/2018 1028   CREATININE 2.66 (H) 05/20/2018 1028   CREATININE 1.73 (H) 02/13/2017 1008   CALCIUM 9.0 05/20/2018 1028   GFRNONAA 20 (L) 05/20/2018 1028   GFRNONAA  33 (L) 02/13/2017 1008   GFRAA 23 (L) 05/20/2018 1028   GFRAA 39 (L) 02/13/2017 1008    Renal function: CrCl cannot be calculated (Patient's most recent lab result is older than the maximum 21 days allowed.).  A/P: Hypertension longstanding currently higher than goal today.Blood pressure elevated at clinic visit today 142/80, however was measured to be 110/60 in clinic on 7/26.  BP Goal <130/80 mmHg. Patient is adherent with current blood pressure medications.  -Continue hydralazine 10 mg TID, clonidine 0.1 mg BID, carvedilol 3.125 mg BID, and amlodipine 10 mg every evening -Counseled on lifestyle modifications for blood pressure control including reduced dietary sodium, increased exercise, adequate sleep -Start checking home blood pressure daily.   Diabetes - Has not yet started Januvia.  -Prior authorization has been approved for Januvia (sitagliptin). Pick up and start Januvia 25 mg once daily.  -Start checking blood glucose measures daily.  Reevaluation at next visit with Dr. Tammi Klippel.  Results reviewed and written information provided.   Total time in face-to-face counseling 20 minutes.   F/U with Dr. Tammi Klippel on 8/26.  Patient seen with Sharyne Peach, PharmD Candidate, Gwenlyn Found, PharmD, PGY1 Pharmacy Resident, and Catie Darnelle Maffucci, PharmD,  PGY2 Pharmacy Resident.

## 2018-07-08 NOTE — Assessment & Plan Note (Signed)
Diabetes - Has not yet started Januvia.  -Prior authorization has been approved for Januvia (sitagliptin). Pick up and start Januvia 25 mg once daily.  -Start checking blood glucose measures daily.  Reevaluation at next visit with Dr. Tammi Klippel.

## 2018-07-12 NOTE — Progress Notes (Signed)
Patient ID: Sarah Kline, female   DOB: December 05, 1965, 52 y.o.   MRN: [id number] Reviewed: Agree with Dr. Graylin Shiver documentation and management.

## 2018-07-21 NOTE — Progress Notes (Signed)
Subjective:    Patient ID: Sarah Kline, female    DOB: [birth year], 52 y.o.   MRN: [id number]   CC:  HPI: Diabetes Fasting checks: does not check Post prandial has checked twice, measured at 192 and 182 in the evening   Compliance: good Diet: improving, started using zucchini noodles   Exercise: elliptical every other day  Eye exam: due Foot exam: 06/25/18 Fructosamine: 305 on 7/26  Symptoms: no symptoms of hypoglycemia. no symptoms of  polyuria, polydipsia. no numbness in extremities, and no foot ulcers/trauma Meds: Januvia 25mg  daily   Hypertension: - Medications: hydralazine 10mg  tid, clonodine 0.1mg  bid, carvedilol 3.125mg  bid, amlodipine 10mg  daily  - Compliance: good  - Checking BP at home: no  - Denies any SOB, CP, vision changes, LE edema, medication SEs, or symptoms of hypotension - Diet: see above - Exercise: see above   Right ear congestion Patient presenting today with 3 weeks of feeling like her right ear is clogged.  Says that she can only feel it if she pushes on her ear, almost as if there is fluid in there.  Denies having any water into her ear or other objects.  Denies any fevers or chills.  Denies any nasal drainage or headaches.  Objective:  BP 125/80 (BP Location: Right Arm, Patient Position: Sitting, Cuff Size: Normal)   Pulse 75   Temp 98.8 F (37.1 C) (Oral)   Ht 5\' 4"  (1.626 m)   Wt 238 lb 3.2 oz (108 kg)   SpO2 95%   BMI 40.89 kg/m  Vitals and nursing note reviewed  General: well nourished, in no acute distress HEENT: normocephalic, TM's visualized bilaterally, cerumen present in right ear, no tenderness to pulling of pinna or palpation of tragus, no scleral icterus or conjunctival pallor, no nasal discharge, moist mucous membranes  Cardiac: RRR, clear S1 and S2, no murmurs, rubs, or gallops Respiratory: clear to auscultation bilaterally, no increased work of breathing Abdomen: soft, nontender, nondistended, no masses or organomegaly.  Bowel sounds present Extremities: no edema or cyanosis. Warm, well perfused. 2+ radial pulses   Skin: warm and dry, no rashes noted Neuro: alert and oriented, no focal deficits   Assessment & Plan:    HTN (hypertension) Currently within goal with blood pressure of 125/80 today.  Blood pressure goal of less than 130/80.  Patient is compliant on all blood pressure medications.  No symptoms of hypo-tension. -Continue hydralazine 10 mg 3 times daily, clonidine 0.1 mg twice daily, carvedilol 3.125 mg twice daily, amlodipine 10 mg daily -Monitor blood pressures daily at home -Continue to work on lifestyle modifications of diet and exercise -Patient states she has follow-up with Dr. Valentina Lucks scheduled for September  Diabetes mellitus ([place]) Compliant on Januvia as well as working on lifestyle modifications of diet and exercise.  Patient does not check blood sugars regularly at home but when she does they are below 200 post prandial.  Encourage patient to continue diet and exercise modifications.  Encourage patient to follow-up with ophthalmologist.  Fructosamine elevated on 7/26 however this is prior to medications being started.  Will recheck fructosamine level today, anticipate that it will be lower given that patient has been compliant of medications as well as improving diet and exercise. -Follow up in 3 months -ambulatory referral to ophthalmology   Ear congestion, right Physical exam revealing normal tympanic membranes and no tenderness when pulling at pinna.  No concern for otitis media or otitis externa.  Likely that this is secondary to  congestion from allergy related.  Encourage patient to use over-the-counter loratadine or cetirizine as needed.  Strict return precautions given  Healthcare maintenance Flu vaccine given today    Return in about 3 months (around 10/26/2018).   Caroline More, DO, PGY-2

## 2018-07-26 ENCOUNTER — Ambulatory Visit: Payer: Medicaid Other | Admitting: Family Medicine

## 2018-07-26 VITALS — BP 125/80 | HR 75 | Temp 98.8°F | Ht 64.0 in | Wt 238.2 lb

## 2018-07-26 DIAGNOSIS — Z23 Encounter for immunization: Secondary | ICD-10-CM

## 2018-07-26 DIAGNOSIS — E1129 Type 2 diabetes mellitus with other diabetic kidney complication: Secondary | ICD-10-CM | POA: Diagnosis present

## 2018-07-26 DIAGNOSIS — H938X1 Other specified disorders of right ear: Secondary | ICD-10-CM | POA: Diagnosis not present

## 2018-07-26 DIAGNOSIS — I10 Essential (primary) hypertension: Secondary | ICD-10-CM | POA: Diagnosis not present

## 2018-07-26 DIAGNOSIS — Z0000 Encounter for general adult medical examination without abnormal findings: Secondary | ICD-10-CM

## 2018-07-26 NOTE — Assessment & Plan Note (Signed)
Currently within goal with blood pressure of 125/80 today.  Blood pressure goal of less than 130/80.  Patient is compliant on all blood pressure medications.  No symptoms of hypo-tension. -Continue hydralazine 10 mg 3 times daily, clonidine 0.1 mg twice daily, carvedilol 3.125 mg twice daily, amlodipine 10 mg daily -Monitor blood pressures daily at home -Continue to work on lifestyle modifications of diet and exercise -Patient states she has follow-up with Dr. Valentina Lucks scheduled for September

## 2018-07-26 NOTE — Assessment & Plan Note (Signed)
Physical exam revealing normal tympanic membranes and no tenderness when pulling at pinna.  No concern for otitis media or otitis externa.  Likely that this is secondary to congestion from allergy related.  Encourage patient to use over-the-counter loratadine or cetirizine as needed.  Strict return precautions given

## 2018-07-26 NOTE — Assessment & Plan Note (Signed)
Flu vaccine given today. 

## 2018-07-26 NOTE — Assessment & Plan Note (Addendum)
Compliant on Januvia as well as working on lifestyle modifications of diet and exercise.  Patient does not check blood sugars regularly at home but when she does they are below 200 post prandial.  Encourage patient to continue diet and exercise modifications.  Encourage patient to follow-up with ophthalmologist.  Fructosamine elevated on 7/26 however this is prior to medications being started.  Will recheck fructosamine level today, anticipate that it will be lower given that patient has been compliant of medications as well as improving diet and exercise. -Follow up in 3 months -ambulatory referral to ophthalmology

## 2018-07-26 NOTE — Patient Instructions (Signed)
It was a pleasure seeing you today.   Today we discussed your diabetes and high blood pressure  For your diabetes: please continue the [place]. I will obtain a fructosamine level on you today and have you come in for follow up in 3 months. Please follow up with an eye doctor.   For your hypertension: please continue your current medications, your blood pressure today was normal. If you have dizziness or other signs of low blood pressure please follow up.  For your right ear pain: please use an over the counter allergy medicine such as claritin, zyrtec, or their generic forms.   Please follow up in 3 months or sooner if symptoms persist or worsen. Please call the clinic immediately if you have any concerns.   Our clinic's number is [phone]. Please call with questions or concerns.   Thank you,  Caroline More, DO

## 2018-07-27 LAB — FRUCTOSAMINE: Fructosamine: 370 umol/L — ABNORMAL HIGH (ref 0–285)

## 2018-07-28 ENCOUNTER — Other Ambulatory Visit: Payer: Self-pay | Admitting: Family Medicine

## 2018-07-28 ENCOUNTER — Telehealth: Payer: Self-pay

## 2018-07-28 MED ORDER — DULAGLUTIDE 0.75 MG/0.5ML ~~LOC~~ SOAJ: 0.5000 mL | SUBCUTANEOUS | 3 refills | Status: DC

## 2018-07-28 NOTE — Telephone Encounter (Signed)
Prior approval for trulicity completed via [place].  Med approved for 07/28/18 - 07/28/19  Prior approval # [id number] [facility] pharmacy informed.  Wallace Cullens, RN

## 2018-07-28 NOTE — Telephone Encounter (Signed)
Received fax from [place] requesting prior authorization of Trulicity. Form placed in PCP's box for completion along with Medicaid formulary. Danley Danker, RN [facility] RN)

## 2018-07-28 NOTE — Progress Notes (Signed)
Patient with elevated fructosamine level despite starting Januvia. Given that blood sugars are not well controlled on Januvia will plan to discontinue Januvia and start Trulicity. Patient is blind and daily insulin injections will provide great burden. Patient agreeable to once weekly Trulicity. Will initiate at low dose and increase as needed. Instructed patient to have 1 month follow up to ensure adequate control on Trulicity.   Discussed patient with Dr. Valentina Lucks who is also seeing patient for HTN management, per patient she has appointment coming up.   Dalphine Handing, PGY-2 [facility] 07/28/2018 8:44 AM

## 2018-08-17 ENCOUNTER — Other Ambulatory Visit: Payer: Self-pay | Admitting: Pharmacist

## 2018-09-07 DIAGNOSIS — I129 Hypertensive chronic kidney disease with stage 1 through stage 4 chronic kidney disease, or unspecified chronic kidney disease: Secondary | ICD-10-CM | POA: Diagnosis not present

## 2018-09-07 DIAGNOSIS — N184 Chronic kidney disease, stage 4 (severe): Secondary | ICD-10-CM | POA: Diagnosis not present

## 2018-09-07 DIAGNOSIS — E669 Obesity, unspecified: Secondary | ICD-10-CM | POA: Diagnosis not present

## 2018-09-07 DIAGNOSIS — R809 Proteinuria, unspecified: Secondary | ICD-10-CM | POA: Diagnosis not present

## 2018-09-07 DIAGNOSIS — N2589 Other disorders resulting from impaired renal tubular function: Secondary | ICD-10-CM | POA: Diagnosis not present

## 2018-09-07 DIAGNOSIS — G459 Transient cerebral ischemic attack, unspecified: Secondary | ICD-10-CM | POA: Diagnosis not present

## 2018-09-07 DIAGNOSIS — N2581 Secondary hyperparathyroidism of renal origin: Secondary | ICD-10-CM | POA: Diagnosis not present

## 2018-09-07 DIAGNOSIS — E785 Hyperlipidemia, unspecified: Secondary | ICD-10-CM | POA: Diagnosis not present

## 2018-09-07 DIAGNOSIS — H547 Unspecified visual loss: Secondary | ICD-10-CM | POA: Diagnosis not present

## 2018-09-07 DIAGNOSIS — D573 Sickle-cell trait: Secondary | ICD-10-CM | POA: Diagnosis not present

## 2018-09-07 DIAGNOSIS — E1122 Type 2 diabetes mellitus with diabetic chronic kidney disease: Secondary | ICD-10-CM | POA: Diagnosis not present

## 2018-09-07 DIAGNOSIS — D631 Anemia in chronic kidney disease: Secondary | ICD-10-CM | POA: Diagnosis not present

## 2018-09-13 ENCOUNTER — Telehealth: Payer: Self-pay | Admitting: Family Medicine

## 2018-09-13 NOTE — Telephone Encounter (Signed)
Pt would like for Dr. Tammi Klippel to call her to discuss her medication. Pt wanted to make an appointment but Dr. Tammi Klippel does not have anything available until 11/04/18. The best contact number is [phone].

## 2018-09-14 NOTE — Telephone Encounter (Signed)
Spoke to patient regarding question. Patient stated her new Medicare would not cover trulicity and it would be very expensive. When asked if it was because they need a prior authorization vs. Non formulary medication patient was not sure. I asked patient to contact pharmacy to inform us whether prior authorization was needed or if patient would need to switch to another medication on formulary. Patient stated she would call later today and inform us what insurance would require. After I receive this information I will either fill out appropriate paperwork or change medications respectively.   Dalphine Handing, PGY-2 [facility] 09/14/2018 9:15 AM

## 2018-09-21 ENCOUNTER — Other Ambulatory Visit: Payer: Self-pay | Admitting: *Deleted

## 2018-09-21 DIAGNOSIS — I10 Essential (primary) hypertension: Secondary | ICD-10-CM

## 2018-09-21 MED ORDER — CLONIDINE HCL 0.1 MG PO TABS: 0.1000 mg | ORAL_TABLET | Freq: Two times a day (BID) | ORAL | 3 refills | Status: DC

## 2018-10-18 ENCOUNTER — Other Ambulatory Visit: Payer: Self-pay

## 2018-10-18 DIAGNOSIS — I10 Essential (primary) hypertension: Secondary | ICD-10-CM

## 2018-10-18 MED ORDER — HYDRALAZINE HCL 10 MG PO TABS: 10.0000 mg | ORAL_TABLET | Freq: Three times a day (TID) | ORAL | 3 refills | Status: DC

## 2018-10-22 ENCOUNTER — Other Ambulatory Visit: Payer: Self-pay | Admitting: Family Medicine

## 2018-10-22 DIAGNOSIS — I10 Essential (primary) hypertension: Secondary | ICD-10-CM

## 2018-12-14 DIAGNOSIS — E785 Hyperlipidemia, unspecified: Secondary | ICD-10-CM | POA: Diagnosis not present

## 2018-12-14 DIAGNOSIS — D573 Sickle-cell trait: Secondary | ICD-10-CM | POA: Diagnosis not present

## 2018-12-14 DIAGNOSIS — E1122 Type 2 diabetes mellitus with diabetic chronic kidney disease: Secondary | ICD-10-CM | POA: Diagnosis not present

## 2018-12-14 DIAGNOSIS — N2589 Other disorders resulting from impaired renal tubular function: Secondary | ICD-10-CM | POA: Diagnosis not present

## 2018-12-14 DIAGNOSIS — N189 Chronic kidney disease, unspecified: Secondary | ICD-10-CM | POA: Diagnosis not present

## 2018-12-14 DIAGNOSIS — N2581 Secondary hyperparathyroidism of renal origin: Secondary | ICD-10-CM | POA: Diagnosis not present

## 2018-12-14 DIAGNOSIS — I129 Hypertensive chronic kidney disease with stage 1 through stage 4 chronic kidney disease, or unspecified chronic kidney disease: Secondary | ICD-10-CM | POA: Diagnosis not present

## 2018-12-14 DIAGNOSIS — G459 Transient cerebral ischemic attack, unspecified: Secondary | ICD-10-CM | POA: Diagnosis not present

## 2018-12-14 DIAGNOSIS — N184 Chronic kidney disease, stage 4 (severe): Secondary | ICD-10-CM | POA: Diagnosis not present

## 2018-12-14 DIAGNOSIS — H547 Unspecified visual loss: Secondary | ICD-10-CM | POA: Diagnosis not present

## 2018-12-14 DIAGNOSIS — D631 Anemia in chronic kidney disease: Secondary | ICD-10-CM | POA: Diagnosis not present

## 2018-12-14 DIAGNOSIS — R809 Proteinuria, unspecified: Secondary | ICD-10-CM | POA: Diagnosis not present

## 2018-12-14 DIAGNOSIS — E669 Obesity, unspecified: Secondary | ICD-10-CM | POA: Diagnosis not present

## 2018-12-21 ENCOUNTER — Other Ambulatory Visit: Payer: Self-pay | Admitting: Family Medicine

## 2018-12-21 DIAGNOSIS — I10 Essential (primary) hypertension: Secondary | ICD-10-CM

## 2018-12-25 NOTE — Progress Notes (Signed)
Subjective:    Patient ID: Sarah Kline, female    DOB: 03-01-1966, 53 y.o.   MRN: [id number]   CC: Diabetes and rash under her breast  HPI: Diabetes Fasting checks: Does not check, needs to get glucometer Post prandial does not check Compliance: Has not taken medications in over 4 months, has been out of her medications and the co-pay for Trulicity is $496 so she is not able to afford this Diet: has not changed  Exercise: none Eye exam: does not get  Foot exam: UTD, last done 05/2018 A1C: 12.3 Symptoms: no symptoms of hypoglycemia. no symptoms of  polyuria, polydipsia. no numbness in extremities, and no foot ulcers/trauma Meds: Trulicity 0.75mg /week  Hypertension: - Medications: hydralazine 10mg  tid, clonidine 0.1mg  bid, carvedilol 3.125 mg bid, amlodipine 10mg  daily  - Compliance: good - Denies any SOB, CP, vision changes, LE edema, medication SEs, or symptoms of hypotension - Diet: see above - Exercise: see above  Rash Patient reports she has a rash under her breasts.  Has previously been seen for this in clinic and it was initially improving.  States that it comes and goes periodically.  Recently she started noticing an odor and increased moisture as well.  Has been using nystatin powder but she feels like it is not as effective anymore.  Has been changing her soaps around.  Denies any itching.  Denies any fevers or chills.  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  Husband states that he does not think it was bleeding.   Objective:  BP 130/72   Pulse 64   Temp 98.8 F (37.1 C) (Oral)   Wt 229 lb 3.2 oz (104 kg)   SpO2 99%   BMI 39.34 kg/m  Vitals and nursing note reviewed  General: well nourished, in no acute distress HEENT: normocephalic, no scleral icterus or conjunctival pallor, no nasal discharge, moist mucous membranes, good dentition without erythema or discharge noted in posterior oropharynx Cardiac: RRR, clear S1 and S2, no  murmurs, rubs, or gallops Respiratory: clear to auscultation bilaterally, no increased work of breathing Abdomen: soft, nontender, nondistended, no masses or organomegaly. Bowel sounds present Extremities: no edema or cyanosis. Warm, well perfused.   Skin: warm and dry, rash under breast with satellite lesions Neuro: alert and oriented, no focal deficits   Assessment & Plan:    Diabetes mellitus ([place]) Worsening glycemic control secondary to noncompliance.  Patient unable to afford her Trulicity.  Has failed multiple oral medications.  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  Has compromised to take a weekly injectable.  Have worked with Kenney Houseman in the pharmacy department to help with financial assistance for [employer].  Given patient's income we will work on getting low income based subsidized medication cost.  Hopefully patient can get Trulicity for a lower co-pay.  Patient is to bring proof of income to clinic to give to pharmacy department.  Paperwork completed by me and Kenney Houseman, pharmacist, in office. -Continue Trulicity, if unable to afford may have to transition to insulin despite patient's needle phobia as this will be the only option for her at this point -Follow-up in 3 months if able to obtain Trulicity, 2 weeks if unable to obtain Trulicity -Lifestyle changes of healthier diet and daily exercise -Patient stated she will go out and get a glucometer today, hopefully she will be able to check her blood sugars daily and have her husband read the glucometer for her as  she is blind -Strict return precautions given  HTN (hypertension) Currently within goal with blood pressure of 130/72.  Patient is compliant on blood pressure medications at this time.  No symptoms of hypotension. -Continue hydralazine 10mg  tid, clonidine 0.1mg  bid, carvedilol 3.125 mg bid, amlodipine 10mg  daily  -Continue to work on lifestyle modifications of diet and exercise -Follow-up in 3  months  Intertriginous candidiasis Rash consistent with intertrigo/candidiasis.  No drainage noted on exam.  No erythema or warmth.  No fevers.  Unlikely any cellulitis.  Given that patient has not improved with nystatin powder will give ketoconazole cream.  Advised to use cornstarch/baby powder on top of cream to avoid moisture as this would encourage yeast formation.  Can consider anti-inflammatory agent such as steroids if rash is not improving.  Advised to follow-up in 1 month if no improvement.  Advised patient to prophylactically use baby powder/cornstarch under her breast daily to avoid moisture and rash formation.  Discussed patient with Dr. Andria Frames and Dr. Valentina Lucks  Return in about 3 months (around 03/31/2019).   Caroline More, DO, PGY-2

## 2018-12-27 ENCOUNTER — Other Ambulatory Visit: Payer: Self-pay | Admitting: *Deleted

## 2018-12-27 DIAGNOSIS — H2000 Unspecified acute and subacute iridocyclitis: Secondary | ICD-10-CM | POA: Diagnosis not present

## 2018-12-27 DIAGNOSIS — I10 Essential (primary) hypertension: Secondary | ICD-10-CM

## 2018-12-27 DIAGNOSIS — H5711 Ocular pain, right eye: Secondary | ICD-10-CM | POA: Diagnosis not present

## 2018-12-27 MED ORDER — ATORVASTATIN CALCIUM 80 MG PO TABS: ORAL_TABLET | ORAL | 3 refills | Status: DC

## 2018-12-27 MED ORDER — CALCITRIOL 0.25 MCG PO CAPS: 0.2500 ug | ORAL_CAPSULE | Freq: Every day | ORAL | 3 refills | Status: DC

## 2018-12-27 MED ORDER — CLONIDINE HCL 0.1 MG PO TABS: 0.1000 mg | ORAL_TABLET | Freq: Two times a day (BID) | ORAL | 3 refills | Status: DC

## 2018-12-27 MED ORDER — AMLODIPINE BESYLATE 10 MG PO TABS: 10.0000 mg | ORAL_TABLET | Freq: Every evening | ORAL | 3 refills | Status: DC

## 2018-12-27 MED ORDER — CARVEDILOL 3.125 MG PO TABS: ORAL_TABLET | ORAL | 3 refills | Status: DC

## 2018-12-27 MED ORDER — HYDRALAZINE HCL 10 MG PO TABS: 10.0000 mg | ORAL_TABLET | Freq: Three times a day (TID) | ORAL | 3 refills | Status: DC

## 2018-12-30 ENCOUNTER — Ambulatory Visit (INDEPENDENT_AMBULATORY_CARE_PROVIDER_SITE_OTHER): Payer: Medicare PPO | Admitting: Family Medicine

## 2018-12-30 ENCOUNTER — Other Ambulatory Visit: Payer: Self-pay

## 2018-12-30 ENCOUNTER — Encounter: Payer: Self-pay | Admitting: Family Medicine

## 2018-12-30 VITALS — BP 130/72 | HR 64 | Temp 98.8°F | Wt 229.2 lb

## 2018-12-30 DIAGNOSIS — B372 Candidiasis of skin and nail: Secondary | ICD-10-CM | POA: Diagnosis not present

## 2018-12-30 DIAGNOSIS — I10 Essential (primary) hypertension: Secondary | ICD-10-CM

## 2018-12-30 DIAGNOSIS — E1122 Type 2 diabetes mellitus with diabetic chronic kidney disease: Secondary | ICD-10-CM

## 2018-12-30 LAB — POCT GLYCOSYLATED HEMOGLOBIN (HGB A1C): HBA1C, POC (CONTROLLED DIABETIC RANGE): 12.3 % — AB (ref 0.0–7.0)

## 2018-12-30 MED ORDER — KETOCONAZOLE 2 % EX CREA: 1.0000 "application " | TOPICAL_CREAM | Freq: Every day | CUTANEOUS | 1 refills | Status: DC

## 2018-12-30 NOTE — Patient Instructions (Signed)
It was a pleasure seeing you today.   Today we discussed your diabetes and your rash  For your diabetes: please bring all required paperwork and make sure they give it to PHARMACY. We will try to get your medications to you ASAP  For your rash: I have given you a cream to use daily. After putting on the cream please put baby powder/cornstarch (there is an over the counter option that has both in it) over top. Keeping the area dry will be essential for the rash to improve.   Please follow up in 3 monthor sooner if symptoms persist or worsen. Please call the clinic immediately if you have any concerns.   Our clinic's number is [phone]. Please call with questions or concerns.   Please go to the emergency room if you have very low or high sugars.   Thank you,  Caroline More, DO

## 2018-12-31 NOTE — Assessment & Plan Note (Signed)
Currently within goal with blood pressure of 130/72.  Patient is compliant on blood pressure medications at this time.  No symptoms of hypotension. -Continue hydralazine 10mg  tid, clonidine 0.1mg  bid, carvedilol 3.125 mg bid, amlodipine 10mg  daily  -Continue to work on lifestyle modifications of diet and exercise -Follow-up in 3 months

## 2018-12-31 NOTE — Assessment & Plan Note (Signed)
Worsening glycemic control secondary to noncompliance.  Patient unable to afford her Trulicity.  Has failed multiple oral medications.  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  Has compromised to take a weekly injectable.  Have worked with Kenney Houseman in the pharmacy department to help with financial assistance for [employer].  Given patient's income we will work on getting low income based subsidized medication cost.  Hopefully patient can get Trulicity for a lower co-pay.  Patient is to bring proof of income to clinic to give to pharmacy department.  Paperwork completed by me and Kenney Houseman, pharmacist, in office. -Continue Trulicity, if unable to afford may have to transition to insulin despite patient's needle phobia as this will be the only option for her at this point -Follow-up in 3 months if able to obtain Trulicity, 2 weeks if unable to obtain Trulicity -Lifestyle changes of healthier diet and daily exercise -Patient stated she will go out and get a glucometer today, hopefully she will be able to check her blood sugars daily and have her husband read the glucometer for her as she is blind -Strict return precautions given

## 2018-12-31 NOTE — Assessment & Plan Note (Signed)
Rash consistent with intertrigo/candidiasis.  No drainage noted on exam.  No erythema or warmth.  No fevers.  Unlikely any cellulitis.  Given that patient has not improved with nystatin powder will give ketoconazole cream.  Advised to use cornstarch/baby powder on top of cream to avoid moisture as this would encourage yeast formation.  Can consider anti-inflammatory agent such as steroids if rash is not improving.  Advised to follow-up in 1 month if no improvement.  Advised patient to prophylactically use baby powder/cornstarch under her breast daily to avoid moisture and rash formation.

## 2019-01-03 ENCOUNTER — Telehealth: Payer: Self-pay | Admitting: Pharmacist

## 2019-01-03 NOTE — Telephone Encounter (Signed)
Received patient's income information. Will fax completed application + income information into [employer].   Will f/u later this week w/ Gean Birchwood on processing  Catie Darnelle Maffucci, PharmD PGY2 Ambulatory Care Pharmacy Resident, [place] Phone: [phone]

## 2019-01-04 ENCOUNTER — Other Ambulatory Visit: Payer: Self-pay

## 2019-01-04 DIAGNOSIS — I10 Essential (primary) hypertension: Secondary | ICD-10-CM

## 2019-01-04 MED ORDER — AMLODIPINE BESYLATE 10 MG PO TABS: 10.0000 mg | ORAL_TABLET | Freq: Every evening | ORAL | 3 refills | Status: DC

## 2019-01-04 NOTE — Telephone Encounter (Signed)
Re-sent. Was set to "no print". Danley Danker, RN [facility] Clinic RN)

## 2019-01-06 NOTE — Telephone Encounter (Signed)
[place] - patient has been approved for Trulicity 1.14 mg through [employer] through 12/01/2019. Should ship to clinic in 7-10 business days.   For refills (or new prescriptions for increased dose), this form will need to be faxed to [employer]: [email or web address]  Contacted patient; she expressed appreciation.   Will route to PCP Dr. Tammi Klippel for [facility], PharmD, [place] PGY2 Ambulatory Care Pharmacy Resident, [place] [facility] Phone: [phone]

## 2019-01-14 ENCOUNTER — Telehealth: Payer: Self-pay | Admitting: *Deleted

## 2019-01-14 NOTE — Telephone Encounter (Signed)
Patient meds showed up today if someone wants to call and let her know. Five boxes of trulicity in the vaccine fridge. [place], Kevin Fenton

## 2019-01-14 NOTE — Telephone Encounter (Signed)
Called patient and informed her that Trulicity is here per Herbie Baltimore.    It is in the refrigerator  in the medication room. On the top shelf next to the flu vaccines.  There are five boxes.  Ozella Almond, [place]

## 2019-01-20 ENCOUNTER — Other Ambulatory Visit: Payer: Self-pay | Admitting: Family Medicine

## 2019-01-20 DIAGNOSIS — I10 Essential (primary) hypertension: Secondary | ICD-10-CM

## 2019-02-22 ENCOUNTER — Telehealth: Payer: Self-pay | Admitting: Family Medicine

## 2019-02-22 NOTE — Telephone Encounter (Signed)
Patient wanting to schedule f/u appointment. Please call patient back.

## 2019-02-23 ENCOUNTER — Telehealth (INDEPENDENT_AMBULATORY_CARE_PROVIDER_SITE_OTHER): Payer: Medicare PPO | Admitting: Family Medicine

## 2019-02-23 ENCOUNTER — Other Ambulatory Visit: Payer: Self-pay

## 2019-02-23 DIAGNOSIS — E1122 Type 2 diabetes mellitus with diabetic chronic kidney disease: Secondary | ICD-10-CM | POA: Diagnosis not present

## 2019-02-23 NOTE — Progress Notes (Signed)
[place] Telemedicine Visit  Patient consented to have visit conducted via telephone.  Encounter participants: Patient: Sarah Kline  Provider: Caroline More  Others (if applicable): None  Chief Complaint: Diabetes follow up   HPI: Diabetes Fasting checks: does not check Post prandial does not check  Compliance: good  Diet: About the same. No carbohydrates or sweets.   Exercise: Walking around the house  Eye exam: Not obtained Foot exam: UTD, last exam 05/2018 A1C: unknown  Symptoms: no symptoms of hypoglycemia. no symptoms of  polyuria, polydipsia. no numbness in extremities, and no foot ulcers/trauma Meds: Trulicity 0.75mg    Patient reports that she is doing well. Still not checking her sugars, but is taking her medications as prescribed. Has been able to get trulicity for free from pharmacy team at [facility]. Patient states she has glucometer supplies and husband can help her check since she may not be able to come to office x1 month.   ROS: See HPI, other ROS negative   Pertinent PMHx: Diabetes, hypertension, TIA, legally blind  Assessment/Plan:  Diabetes mellitus ([place]) Patient seems to be doing well on new regimen of Trulicity.  I am very happy that she was able to get this for free from the pharmacy team here at [facility]  Patient is still not checking her sugars at home, advised that she do this especially given that I was unable to get an A1c or fructosamine level today.  Advised that she continue current medication regimen and follow-up in April.  Advised that she consider follow-up with Dr. Valentina Lucks in pharmacy clinic as they are able to get her medications at a reduced cost.  Strict return precautions given.  Advised to eat healthy and daily exercise.    Time spent on phone with patient: 15 minutes Charged for this visit Will forward to preceptor Dr. Mingo Amber

## 2019-02-23 NOTE — Progress Notes (Signed)
I was preceptor the day of this visit.   

## 2019-02-23 NOTE — Telephone Encounter (Signed)
Called patient. Patient agreed to telephone visit for diabetes f/u  Sarah More, DO, PGY-2 [facility] 02/23/2019 11:29 AM

## 2019-02-23 NOTE — Assessment & Plan Note (Signed)
Patient seems to be doing well on new regimen of Trulicity.  I am very happy that she was able to get this for free from the pharmacy team here at [facility].  Patient is still not checking her sugars at home, advised that she do this especially given that I was unable to get an A1c or fructosamine level today.  Advised that she continue current medication regimen and follow-up in April.  Advised that she consider follow-up with Dr. Valentina Lucks in pharmacy clinic as they are able to get her medications at a reduced cost.  Strict return precautions given.  Advised to eat healthy and daily exercise.

## 2019-03-07 DIAGNOSIS — E1122 Type 2 diabetes mellitus with diabetic chronic kidney disease: Secondary | ICD-10-CM | POA: Diagnosis not present

## 2019-03-07 DIAGNOSIS — N2589 Other disorders resulting from impaired renal tubular function: Secondary | ICD-10-CM | POA: Diagnosis not present

## 2019-03-07 DIAGNOSIS — E669 Obesity, unspecified: Secondary | ICD-10-CM | POA: Diagnosis not present

## 2019-03-07 DIAGNOSIS — E785 Hyperlipidemia, unspecified: Secondary | ICD-10-CM | POA: Diagnosis not present

## 2019-03-07 DIAGNOSIS — N184 Chronic kidney disease, stage 4 (severe): Secondary | ICD-10-CM | POA: Diagnosis not present

## 2019-03-07 DIAGNOSIS — R809 Proteinuria, unspecified: Secondary | ICD-10-CM | POA: Diagnosis not present

## 2019-03-07 DIAGNOSIS — N2581 Secondary hyperparathyroidism of renal origin: Secondary | ICD-10-CM | POA: Diagnosis not present

## 2019-03-07 DIAGNOSIS — I129 Hypertensive chronic kidney disease with stage 1 through stage 4 chronic kidney disease, or unspecified chronic kidney disease: Secondary | ICD-10-CM | POA: Diagnosis not present

## 2019-03-07 DIAGNOSIS — D631 Anemia in chronic kidney disease: Secondary | ICD-10-CM | POA: Diagnosis not present

## 2019-03-19 DIAGNOSIS — E113513 Type 2 diabetes mellitus with proliferative diabetic retinopathy with macular edema, bilateral: Secondary | ICD-10-CM | POA: Diagnosis not present

## 2019-03-19 DIAGNOSIS — E1122 Type 2 diabetes mellitus with diabetic chronic kidney disease: Secondary | ICD-10-CM | POA: Diagnosis not present

## 2019-03-19 DIAGNOSIS — Z8673 Personal history of transient ischemic attack (TIA), and cerebral infarction without residual deficits: Secondary | ICD-10-CM | POA: Diagnosis not present

## 2019-03-19 DIAGNOSIS — F4321 Adjustment disorder with depressed mood: Secondary | ICD-10-CM | POA: Diagnosis not present

## 2019-03-19 DIAGNOSIS — N184 Chronic kidney disease, stage 4 (severe): Secondary | ICD-10-CM | POA: Diagnosis not present

## 2019-03-19 DIAGNOSIS — H548 Legal blindness, as defined in USA: Secondary | ICD-10-CM | POA: Diagnosis not present

## 2019-03-19 DIAGNOSIS — I129 Hypertensive chronic kidney disease with stage 1 through stage 4 chronic kidney disease, or unspecified chronic kidney disease: Secondary | ICD-10-CM | POA: Diagnosis not present

## 2019-03-19 DIAGNOSIS — Z6837 Body mass index (BMI) 37.0-37.9, adult: Secondary | ICD-10-CM | POA: Diagnosis not present

## 2019-03-31 ENCOUNTER — Ambulatory Visit (INDEPENDENT_AMBULATORY_CARE_PROVIDER_SITE_OTHER): Payer: Medicare PPO | Admitting: Family Medicine

## 2019-03-31 ENCOUNTER — Other Ambulatory Visit: Payer: Self-pay

## 2019-03-31 ENCOUNTER — Ambulatory Visit: Payer: Medicare PPO | Admitting: Family Medicine

## 2019-03-31 VITALS — BP 130/74 | HR 79 | Ht 64.0 in

## 2019-03-31 DIAGNOSIS — E785 Hyperlipidemia, unspecified: Secondary | ICD-10-CM

## 2019-03-31 DIAGNOSIS — E1122 Type 2 diabetes mellitus with diabetic chronic kidney disease: Secondary | ICD-10-CM

## 2019-03-31 DIAGNOSIS — H548 Legal blindness, as defined in USA: Secondary | ICD-10-CM

## 2019-03-31 DIAGNOSIS — E1169 Type 2 diabetes mellitus with other specified complication: Secondary | ICD-10-CM | POA: Insufficient documentation

## 2019-03-31 DIAGNOSIS — I10 Essential (primary) hypertension: Secondary | ICD-10-CM

## 2019-03-31 LAB — POCT GLYCOSYLATED HEMOGLOBIN (HGB A1C): HbA1c, POC (controlled diabetic range): 7.6 % — AB (ref 0.0–7.0)

## 2019-03-31 NOTE — Patient Instructions (Signed)
No changes to medications today.   Please work on your diet and exercise to get you a1c down just a little bit more. a1c today is much improved which is great.   Your trulicity is not here yet but we will let you know when the next shipment is here for pick up.

## 2019-03-31 NOTE — Assessment & Plan Note (Signed)
Stable and well controlled. Continue current medications norvasc 10mg  qd, coreg 3.125mg  BID, clonidine 0.1 mg BID, hydralazine 10mg  TID

## 2019-03-31 NOTE — Assessment & Plan Note (Signed)
Improved. Continue trulicity 0.75mg  weekly. patient has at least a 1 month supply left at this time. She receives trulicity from [place] cares patient assistance program which ships the medication to [facility] and patient picks up the medication here. Discussed healthy diet and exercise to decrease a1c to goal of <7

## 2019-03-31 NOTE — Assessment & Plan Note (Signed)
Continue baby asa and atorvastatin.

## 2019-03-31 NOTE — Assessment & Plan Note (Signed)
Referral to retina placed today

## 2019-03-31 NOTE — Progress Notes (Signed)
    Subjective:  Sarah Kline is a 53 y.o. female who presents to the [facility] today for diabetes follow up  HPI:  DIABETES  Disease Monitoring: does not check at home  Medications: trulicity 7.74 mg weekly. Gets from [place] cares patient assistance. Has 1.5 boxes at home which is enough for the next month. Compliance- good Hypoglycemic symptoms- no  Polyuria- no  Is legally blind, needs a eye doctor appointment with a retina specialist  HYPERTENSION  Disease Monitoring - does not check at home Medications: norvasc 10mg  qd, coreg 3.125mg  BID, clonidine 0.1 mg BID, hydralazine 10mg  TID Compliance- good  Lightheadedness- no   Edema- no Chest pain- no      Dyspnea- no     HYPERLIPIDEMIA  Disease Monitoring  See symptoms for Hypertension  Medications: atorvastatin 80mg  daily and asa81 mg qd Compliance- good RUQ pain- no  Muscle aches- no    ROS See HPI above   PMH Smoking Status noted    Objective:  Physical Exam: BP 130/74   Pulse 79   Ht 5\' 4"  (1.626 m)   SpO2 98%   BMI 39.34 kg/m   Gen: NAD, resting comfortably CV: RRR with no murmurs appreciated Pulm: NWOB, CTAB with no crackles, wheezes, or rhonchi GI: Normal bowel sounds present. Soft, Nontender, Nondistended. MSK: no edema, cyanosis, or clubbing noted Skin: warm, dry Neuro: grossly normal, moves all extremities Psych: Normal affect and thought content  Results for orders placed or performed in visit on 03/31/19 (from the past 72 hour(s))  HgB A1c     Status: Abnormal   Collection Time: 03/31/19  1:40 PM  Result Value Ref Range   Hemoglobin A1C     HbA1c POC (<> result, manual entry)     HbA1c, POC (prediabetic range)     HbA1c, POC (controlled diabetic range) 7.6 (A) 0.0 - 7.0 %     Assessment/Plan:  Diabetes mellitus ([facility]) Improved. Continue trulicity 0.75mg  weekly. patient has at least a 1 month supply left at this time. She receives trulicity from [place] cares patient assistance program which ships  the medication to [facility] and patient picks up the medication here. Discussed healthy diet and exercise to decrease a1c to goal of <7  HTN (hypertension) Stable and well controlled. Continue current medications norvasc 10mg  qd, coreg 3.125mg  BID, clonidine 0.1 mg BID, hydralazine 10mg  TID  Legally blind Referral to retina placed today  Hyperlipidemia associated with type 2 diabetes mellitus ([place]) Continue baby asa and atorvastatin.     Orders Placed This Encounter  Procedures  . Ambulatory referral to Ophthalmology    Referral Priority:   Routine    Referral Type:   Consultation    Referral Reason:   Specialty Services Required    Requested Specialty:   Ophthalmology    Number of Visits Requested:   1  . HgB A1c     Bufford Lope, DO PGY-3, [facility] 03/31/2019 1:43 PM

## 2019-04-14 ENCOUNTER — Telehealth: Payer: Self-pay | Admitting: *Deleted

## 2019-04-14 NOTE — Telephone Encounter (Signed)
rx request for [employer] patient assistance program, for trulicity. Will put form in your box and or ask preceptor to help. Deseree Kennon Holter, CMA

## 2019-05-30 ENCOUNTER — Telehealth: Payer: Self-pay | Admitting: *Deleted

## 2019-05-30 NOTE — Telephone Encounter (Signed)
Refill request form will need to be completed and signed by PCP:  [email or web address]  Fax to number on form for [employer].

## 2019-05-30 NOTE — Telephone Encounter (Signed)
Pt calls because she took her last trulicity today.  She is not sure what to do next as she is part of [employer] cares.  Will send to pharmacy team to help. Christen Bame, CMA

## 2019-05-31 NOTE — Telephone Encounter (Signed)
[employer] forms completed and placed on "to be faxed pile". Please let patient know paperwork was completed.   Dalphine Handing, PGY-2 [facility] 05/31/2019 3:25 PM

## 2019-06-09 NOTE — Telephone Encounter (Signed)
Pt states that she still has not heard back.  I advised that we faxed it on June 30th and think that there may be a delay because of the holiday.  Gave her the 1800 number on the form to call and check status.  Christen Bame, CMA

## 2019-06-16 DIAGNOSIS — N184 Chronic kidney disease, stage 4 (severe): Secondary | ICD-10-CM | POA: Diagnosis not present

## 2019-06-16 DIAGNOSIS — R809 Proteinuria, unspecified: Secondary | ICD-10-CM | POA: Diagnosis not present

## 2019-06-16 DIAGNOSIS — D631 Anemia in chronic kidney disease: Secondary | ICD-10-CM | POA: Diagnosis not present

## 2019-06-16 DIAGNOSIS — E1122 Type 2 diabetes mellitus with diabetic chronic kidney disease: Secondary | ICD-10-CM | POA: Diagnosis not present

## 2019-06-16 DIAGNOSIS — N2589 Other disorders resulting from impaired renal tubular function: Secondary | ICD-10-CM | POA: Diagnosis not present

## 2019-06-16 DIAGNOSIS — Z1159 Encounter for screening for other viral diseases: Secondary | ICD-10-CM | POA: Diagnosis not present

## 2019-06-16 DIAGNOSIS — I129 Hypertensive chronic kidney disease with stage 1 through stage 4 chronic kidney disease, or unspecified chronic kidney disease: Secondary | ICD-10-CM | POA: Diagnosis not present

## 2019-06-16 DIAGNOSIS — N2581 Secondary hyperparathyroidism of renal origin: Secondary | ICD-10-CM | POA: Diagnosis not present

## 2019-06-17 ENCOUNTER — Telehealth: Payer: Self-pay | Admitting: Family Medicine

## 2019-06-17 NOTE — Telephone Encounter (Signed)
Was informed that there was a refrigeration issue affecting Trulicity. Pharmacy team is currently working to correct this. When trulicty is available please call patient and inform her medication is available for pick up  Sarah More, DO, PGY-3 [place] [facility] 06/17/2019 10:00 AM

## 2019-06-20 ENCOUNTER — Telehealth: Payer: Self-pay | Admitting: Family Medicine

## 2019-06-20 NOTE — Telephone Encounter (Signed)
Received word from pharmacy team that patient Trulicity was shipped and will be available later this week.  Have called patient to inform her.  Staff member from [facility] will call patient when Trulicity is available to pick up.  Dalphine Handing, PGY-3 [facility] 06/20/2019 1:31 PM

## 2019-06-20 NOTE — Telephone Encounter (Signed)
Called patient and informed her of the refrigeration issue and the affect upon Trulicity.  Patient verbalized understanding.  Assured patient that she will be called when it is ready.  Ozella Almond, CMA\

## 2019-06-22 ENCOUNTER — Telehealth: Payer: Self-pay

## 2019-06-22 NOTE — Telephone Encounter (Signed)
Called and informed patient via voice mail that Trulicity has been replaced and is ready for pick up.. It was previously frozen because of refrigeration malfunction.  Ozella Almond, [place]

## 2019-07-04 NOTE — Telephone Encounter (Signed)
Pt called to check status.  She never received VM.  She was informed she can come pick it up. Christen Bame, CMA

## 2019-07-10 NOTE — Progress Notes (Signed)
[facility] Note  07/11/2019     CHIEF COMPLAINT Patient presents for Diabetic Eye Exam   HISTORY OF PRESENT ILLNESS: Sarah Kline is a 53 y.o. female who presents to the clinic today for:   HPI    Diabetic Eye Exam    Vision is blurred for near and is blurred for distance.  Associated Symptoms Negative for Flashes, Blind Spot, Photophobia, Scalp Tenderness, Fever, Floaters, Pain, Glare, Jaw Claudication, Weight Loss, Distortion, Redness, Trauma, Shoulder/Hip pain and Fatigue.  Diabetes characteristics include Type 2 and on insulin.  This started 25 years ago.  Blood Sugars: Doesn't check.  Last Blood Glucose: Doesn't check.  Last A1C 7.6.  I, the attending physician,  performed the HPI with the patient and updated documentation appropriately.          Comments    Patient referred by Dr. Caroline More for diabetic retinal evaluation. Patient has little to no vision in either eye. Patient states she had retinal detachment repair OS in 2017 at [facility]. Had an additional surgery OS in 2018 to repair blood vessels in retina, but patient doesn't remember specifics of surgery. Patient is positive for sickle cell trait. Patient also says that she doesn't have any vision OD but is unsure of the cause of vision loss OD (vision loss may have been about a year prior to vision loss OS). Remembers history of flashes and floaters prior to vision loss in 2016/2017, but not having those symptoms currently. Patient was seen in January 2020 by Dr. Satira Sark at [facility] for painful OD. They recommended for patient to have OD removed, IOP was elevated OD. Dr. Satira Sark recommended patient follow up with [facility] but patient did not want to return for treatment there. History of elevated IOP's in 2009, was using gtts for IOP control at that time.        Last edited by Bernarda Caffey, MD on 07/11/2019 10:43 AM. (History)    pt states the last eye dr she  saw was Dr. Satira Sark bc her eye was very red and irritated and swollen, she states Dr. Satira Sark told her her right eye needed to come out, pt states she has been having problems with her eyes since 2016, she states she originally saw Dr. Teodoro Spray and was referred to [facility], pt states she had sx there and after the last sx she lost all vision, she states they eventually told her there was nothing else they could do, she states she can only see light with her right eye, but it has never had any sx  Referring physician: Caroline More, DO [address],  [place] [address]  HISTORICAL INFORMATION:   Selected notes from the MEDICAL RECORD NUMBER Referred by Dr. Caroline More for DM exam LEE:  Ocular Hx- PMH-blind, HTN, DM (A1c: 7.6, 71.69.67, takes trulicity)  Last visit with Dr. 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  Difficult case with large RetEx - s/p SO removal/PPV/MP/EL/SO 12/08/16 - large RetEx extended with dense membrane removed - Retina appears flat, IOP WNL  rtc 4-6 mos     CURRENT MEDICATIONS: No current outpatient medications on file. (Ophthalmic Drugs)   No current facility-administered medications for this visit.  (Ophthalmic Drugs)   Current Outpatient Medications (Other)  Medication Sig  . amLODipine (NORVASC) 10 MG tablet Take 1 tablet (10 mg total) by mouth every evening.  . ASPIRIN LOW DOSE 81 MG EC tablet TAKE 1 TABLET BY MOUTH EVERY DAY  . atorvastatin  (LIPITOR) 80 MG tablet TAKE 1 TABLET BY MOUTH DAILY AT 6 PM  . calcitRIOL (ROCALTROL) 0.25 MCG capsule Take 1 capsule (0.25 mcg total) by mouth daily.  . calcium-vitamin D (OSCAL WITH D) 500-200 MG-UNIT tablet Take 1 tablet by mouth.  . carvedilol (COREG) 3.125 MG tablet TAKE 1 TABLET BY MOUTH 2 TIMES DAILY WITH FOOD  . cloNIDine (CATAPRES) 0.1 MG tablet TAKE 1 TABLET BY MOUTH 2 TIMES DAILY  . Dulaglutide (TRULICITY) 9.67 RF/1.6BW SOPN Inject 0.5 mLs into the skin once a week.  Marland Kitchen glucose blood (ACCU-CHEK AVIVA PLUS) test strip Use as instructed  . hydrALAZINE (APRESOLINE) 10 MG tablet Take 1 tablet (10 mg total) by mouth 3 (three) times daily.  Marland Kitchen ketoconazole (NIZORAL) 2 % cream Apply 1 application topically daily.  . sodium bicarbonate 650 MG tablet Take 1,300 mg by mouth 2 (two) times daily.  Marland Kitchen ACCU-CHEK SOFTCLIX LANCETS lancets Use as instructed (Patient not taking: Reported on 07/08/2018)  . Blood Glucose Monitoring Suppl (ACCU-CHEK AVIVA PLUS) w/Device KIT 1 Device by Does not apply route daily. (Patient not taking: Reported on 07/08/2018)  . FEROSUL 325 (65 Fe) MG tablet TAKE 1 TABLET BY MOUTH 3 TIMES DAILY WITH FOOD (Patient not taking: Reported on 05/20/2018)  . Lancets Misc. (ACCU-CHEK SOFTCLIX LANCET DEV) KIT 1 Device by Does not apply route daily. (Patient not taking: Reported on 07/08/2018)   No current facility-administered medications for this visit.  (Other)      REVIEW OF SYSTEMS: ROS    Positive for: Neurological, Endocrine, Eyes   Negative for: Constitutional, Gastrointestinal, Skin, Genitourinary, Musculoskeletal, HENT, Cardiovascular, Respiratory, Psychiatric, Allergic/Imm, Heme/Lymph   Last edited by Roselee Nova D on 07/11/2019  8:59 AM. (History)       ALLERGIES Allergies  Allergen Reactions  . Bactrim [Sulfamethoxazole-Trimethoprim] Rash    PAST MEDICAL HISTORY Past Medical History:  Diagnosis Date  . Blind   . Diabetes mellitus   . Hypertension   . Sickle  cell disease, type S [facility])    Past Surgical History:  Procedure Laterality Date  . EYE SURGERY Left    retinal detachment repair 2017  . EYE SURGERY Left    ? type of surgery to repair blood vessels 2018  . GANGLION CYST EXCISION     Left dorsal wrist  . RETINAL DETACHMENT SURGERY Left    2017, [facility]    FAMILY HISTORY Family History  Problem Relation Age of Onset  . Asthma Mother   . COPD Mother   . Hypertension Mother   . Stroke Mother   . Diabetes Son   . Diabetes Daughter     SOCIAL HISTORY Social History   Tobacco Use  . Smoking status: Never Smoker  . Smokeless tobacco: Never Used  Substance Use Topics  . Alcohol use: Yes    Alcohol/week: 2.0 standard drinks    Types: 2 Shots of liquor per week  Comment: twice per month  . Drug use: No    Comment: Very remote marijuana use, 20 years ago         OPHTHALMIC EXAM:  Base Eye Exam    Visual Acuity (Snellen - Linear)      Right Left   Dist [place] [employer] HM  HM barely perceptible OS       Tonometry (Tonopen, 9:14 AM)      Right Left   Pressure 12 11       Pupils      Dark Light Shape React APD   Right 2 2 Round none None   Left 3 2.5 Round Minimal None       Visual Fields (Counting fingers)      Left Right   Restrictions Total superior temporal, inferior temporal, superior nasal, inferior nasal deficiencies Total superior temporal, inferior temporal, superior nasal, inferior nasal deficiencies       Extraocular Movement   unable       Neuro/Psych    Oriented x3: Yes   Mood/Affect: Normal       Dilation    Both eyes: 1.0% Mydriacyl, 2.5% Phenylephrine @ 9:19 AM       Dilation #2    Both eyes: 10 % phenylepherine @ 10:20 AM        Slit Lamp and Fundus Exam    Slit Lamp Exam      Right Left   Lids/Lashes Dermatochalasis - upper lid Dermatochalasis - upper lid   Conjunctiva/Sclera Melanosis Melanosis   Cornea pigmented Arcus, 3+ Punctate epithelial erosions Arcus, nylon suture at  0130, 3+ Punctate epithelial erosions, Well healed cataract wounds   Anterior Chamber deep deep and clear   Iris 360 Posterior synechiae, No NVI Round and moderately dilated, No NVI   Lens white mature cataract with fibrotic pigment anteriorly PC IOL in good position, trace Posterior capsular opacification   Vitreous no view post vitrectomy, silicone oil       Fundus Exam      Right Left   Disc no view 4+ Pallor   C/D Ratio  0.2   Macula no view Extensive pigment and fibrosis   Vessels no view Severe Vascular attenuation, sclerotic vessels superiorly   Periphery no view Extensive fibrosis, chronic TRD under oil          IMAGING AND PROCEDURES  Imaging and Procedures for _0 @  OCT, Retina - OU - Both Eyes       Right Eye Quality was poor. Progression has no prior data. Findings include abnormal foveal contour.   Left Eye Quality was poor. Progression has no prior data. Findings include abnormal foveal contour, outer retinal atrophy, epiretinal membrane, intraretinal hyper-reflective material, intraretinal fluid, subretinal hyper-reflective material.   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 CME - cystoid macular edema. PED - pigment epithelial detachment. IRF - intraretinal fluid. SRF - subretinal fluid. EZ - ellipsoid zone. ERM - epiretinal membrane. ORA - outer retinal atrophy. ORT - outer retinal tubulation. SRHM - subretinal hyper-reflective material                 ASSESSMENT/PLAN:    ICD-10-CM   1. 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 Retinal edema  H35.81 OCT, Retina - OU - Both Eyes  3. Mature cataract  H26.8   4. Posterior synechiae of right eye  H21.541    5. 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 Proliferative diabetic retinopathy w/ TRD OU (OD > OS) - pt with extensive history of retinal care at [facility] with Dr. Jacklynn Ganong - s/p PPV w/ membrane peel and silicon oil x2 OS (05/7618, and 12/2016) - The incidence, risk factors for progression, natural history and treatment options for diabetic retinopathy were discussed with patient.   - The need for close monitoring of blood glucose, blood pressure, and serum lipids, avoiding cigarette or any type of tobacco, and the need for long term follow up was also discussed with patient. - pt presents today on referral from PCP - pt reports no acute changes in vision and reports eyes have been stable and pain free since January - OD no view of posterior pole due to 360 PS and mature white cataract, OCT OD shows fibrotic chronic TRD - OS with severe ischemia and fibrosis under silicon oil -- limited visual potential, stable - discussed findings and prognosis - no intervention indicated or recommended at this time - recommend evaluation with [facility] - f/u 3 mos, sooner prn  3,4. Mature cataract w/ 360 posterior synechiae OD - low visual potential - recommend monitoring  5. Pseudophakia OS  - s/p CE/IOL in 2017 with PPV  - PCIOL in good position  - monitor  6. Legal Blindness - secondary to severe PDR with TRDs OU and mature cataract OD - will refer to [facility] [place]: [place], [place], [place], [place], [place], [place], [place], [place], [place], [place], [place], [place] Phone: [phone] Toll Free: [phone] Fax: [phone] Email: [email or web address] Physical Address [address], [place], [place] [address]   Ophthalmic Meds Ordered this visit:  No orders of the defined types were placed in this encounter.       Return in about 3 months (around 10/11/2019) for f/u DM exam, DFE, OCT.  There are no Patient Instructions on file for this visit.   Explained the diagnoses, plan, and follow up with the patient and they expressed understanding.  Patient expressed understanding of the importance of proper follow up care.   This document serves as a record of services personally performed by Gardiner Sleeper, MD, PhD. It was created on their behalf by Ernest Mallick, OA, an ophthalmic assistant. The creation of this record is the provider's dictation and/or activities during the visit.    Electronically signed by: Ernest Mallick, OA  08.09.2020 12:46 PM    Gardiner Sleeper, M.D., Ph.D. Diseases & Surgery of the Retina and Vitreous [facility]  I have reviewed the above documentation for accuracy and completeness, and I agree with the above. Gardiner Sleeper, M.D., Ph.D. 07/11/19 12:46 PM    Abbreviations: M myopia (nearsighted); A astigmatism; H hyperopia (farsighted); P presbyopia; Mrx spectacle prescription;  CTL contact lenses; OD right eye; OS left eye; OU both eyes  XT exotropia; ET esotropia; PEK punctate epithelial keratitis; PEE punctate epithelial erosions; DES dry eye syndrome; MGD meibomian gland dysfunction; ATs artificial tears; PFAT's preservative free artificial tears; [place] nuclear sclerotic cataract; PSC posterior subcapsular cataract; ERM epi-retinal membrane; PVD posterior vitreous detachment; RD retinal detachment; DM diabetes mellitus; DR diabetic retinopathy; NPDR non-proliferative diabetic retinopathy;  PDR proliferative diabetic retinopathy; CSME clinically significant macular edema; DME diabetic macular edema; dbh dot blot hemorrhages; CWS cotton wool spot; POAG primary open angle glaucoma; C/D cup-to-disc ratio; HVF humphrey visual field; GVF goldmann visual field; OCT optical coherence tomography; IOP intraocular pressure; BRVO Branch retinal vein occlusion; CRVO  central retinal vein occlusion; CRAO central retinal artery occlusion; BRAO branch retinal artery occlusion; RT retinal tear; SB scleral buckle; PPV pars plana vitrectomy; VH Vitreous hemorrhage; PRP panretinal laser photocoagulation; IVK intravitreal kenalog; VMT vitreomacular traction; MH Macular hole;  NVD neovascularization of the disc; NVE neovascularization elsewhere; AREDS age related eye disease study; ARMD age related macular degeneration; POAG primary open angle glaucoma; EBMD epithelial/anterior basement membrane dystrophy; ACIOL anterior chamber intraocular lens; IOL intraocular lens; PCIOL posterior chamber intraocular lens; Phaco/IOL phacoemulsification with intraocular lens placement; [place] photorefractive keratectomy; LASIK laser assisted in situ keratomileusis; HTN hypertension; DM diabetes mellitus; COPD chronic obstructive pulmonary disease

## 2019-07-11 ENCOUNTER — Other Ambulatory Visit: Payer: Self-pay

## 2019-07-11 ENCOUNTER — Ambulatory Visit (INDEPENDENT_AMBULATORY_CARE_PROVIDER_SITE_OTHER): Payer: Medicare PPO | Admitting: Ophthalmology

## 2019-07-11 ENCOUNTER — Encounter (INDEPENDENT_AMBULATORY_CARE_PROVIDER_SITE_OTHER): Payer: Self-pay | Admitting: Ophthalmology

## 2019-07-11 DIAGNOSIS — Z961 Presence of intraocular lens: Secondary | ICD-10-CM | POA: Diagnosis not present

## 2019-07-11 DIAGNOSIS — H268 Other specified cataract: Secondary | ICD-10-CM

## 2019-07-11 DIAGNOSIS — E113523 Type 2 diabetes mellitus with proliferative diabetic retinopathy with traction retinal detachment involving the macula, bilateral: Secondary | ICD-10-CM

## 2019-07-11 DIAGNOSIS — H548 Legal blindness, as defined in USA: Secondary | ICD-10-CM

## 2019-07-11 DIAGNOSIS — H21541 Posterior synechiae (iris), right eye: Secondary | ICD-10-CM | POA: Diagnosis not present

## 2019-07-11 DIAGNOSIS — H3581 Retinal edema: Secondary | ICD-10-CM | POA: Diagnosis not present

## 2019-09-29 ENCOUNTER — Telehealth: Payer: Self-pay | Admitting: Pharmacist

## 2019-09-29 NOTE — Telephone Encounter (Signed)
Received from Dr. Tammi Klippel a fax sent from [employer] patient assistance program regarding this patient's dulaglutide (Trulicity). Her current medication supply is through [employer] and is set to expire by 12/01/2019. She confirmed she is still taking Trulicity. A new medication application must be filled out.  Patient confirmed she will come to the clinic and bring her income documents and sign the renewal application.

## 2019-10-10 ENCOUNTER — Telehealth: Payer: Self-pay | Admitting: Pharmacist

## 2019-10-10 NOTE — Telephone Encounter (Signed)
Called to confirm address and insurance for [employer] patient assistance program. Requested that patient bring income documentation and sign the forms tomorrow morning (11/10) or Thursday (11/12). Patient verbalized understanding of plan. Contact pharmacist upon arrival to sign forms.

## 2019-10-11 ENCOUNTER — Encounter (INDEPENDENT_AMBULATORY_CARE_PROVIDER_SITE_OTHER): Payer: Medicare PPO | Admitting: Ophthalmology

## 2019-10-13 ENCOUNTER — Other Ambulatory Visit (INDEPENDENT_AMBULATORY_CARE_PROVIDER_SITE_OTHER): Payer: Medicare PPO | Admitting: Pharmacist

## 2019-10-13 ENCOUNTER — Other Ambulatory Visit: Payer: Self-pay | Admitting: *Deleted

## 2019-10-13 ENCOUNTER — Encounter: Payer: Self-pay | Admitting: Pharmacist

## 2019-10-13 DIAGNOSIS — E1122 Type 2 diabetes mellitus with diabetic chronic kidney disease: Secondary | ICD-10-CM

## 2019-10-13 LAB — POCT GLYCOSYLATED HEMOGLOBIN (HGB A1C): HbA1c, POC (controlled diabetic range): 8.5 % — AB (ref 0.0–7.0)

## 2019-10-14 ENCOUNTER — Telehealth: Payer: Self-pay | Admitting: Pharmacist

## 2019-10-14 DIAGNOSIS — E1122 Type 2 diabetes mellitus with diabetic chronic kidney disease: Secondary | ICD-10-CM

## 2019-10-14 LAB — BASIC METABOLIC PANEL
BUN/Creatinine Ratio: 17 (ref 9–23)
BUN: 43 mg/dL — ABNORMAL HIGH (ref 6–24)
CO2: 21 mmol/L (ref 20–29)
Calcium: 9.6 mg/dL (ref 8.7–10.2)
Chloride: 103 mmol/L (ref 96–106)
Creatinine, Ser: 2.59 mg/dL — ABNORMAL HIGH (ref 0.57–1.00)
GFR calc Af Amer: 23 mL/min/{1.73_m2} — ABNORMAL LOW (ref >59)
GFR calc non Af Amer: 20 mL/min/{1.73_m2} — ABNORMAL LOW (ref >59)
Glucose: 188 mg/dL — ABNORMAL HIGH (ref 65–99)
Potassium: 4.9 mmol/L (ref 3.5–5.2)
Sodium: 140 mmol/L (ref 134–144)

## 2019-10-14 NOTE — Telephone Encounter (Signed)
Noted and agree. 

## 2019-10-14 NOTE — Telephone Encounter (Signed)
Thank you for the update. I am very glad to see we were able to get her a talking glucometer as I know we have had difficulty ordering this in the past. I am hopeful with glucometer and medication adherence we can get patient under good glycemic control.   Thank you pharmacy team for all your help in caring for this patient!   Dalphine Handing, PGY-3 [facility] 10/14/2019 9:31 AM

## 2019-10-14 NOTE — Progress Notes (Signed)
A1C was 8.5 slightly higher than most recent 03/31/2019  A1C= 7.6  Not a candidate for SGLT2 given EGFR < 30

## 2019-10-14 NOTE — Progress Notes (Signed)
Patient arrived in clinic with partner to fill out paperwork for Trulicity (dulaglutide) from [employer]. Annual income:  $13,660.80  Reports 3x nocturia Denies Sx of hypogylcemia   Completed Trulicity indigent care paperwork was signed, faxed and scanned into patient chart.   Additionally, patient reported she was unable to get a "talking" blood glucose meter in the past despite the fact she is blind.   [name], PharmD was instrumental in contacting insurance and submitting order for device and supplies.  This effort took > 1 hour.   Lab results: a1C was 8.5 - control was fair, consider increasing dose of trulicity to 9.3JQ weekly in the future. Previously 7.6  03/31/2019   EGFR  was determined to be < 30 on BMET (not a candidate for SGLT2 at this time).

## 2019-10-14 NOTE — Telephone Encounter (Signed)
Contacted patient to share her lab results  BMET similar to in the past A1C was increased from 7.6 to 8.5   Also shared that talking blood glucometer was ordered through the efforts of [name], PharmD yesterday.  And  MAP paperwork was completed and submitted for Trulicity  She was appreciative.

## 2019-10-17 MED ORDER — PRODIGY AUTOCODE BLOOD GLUCOSE W/DEVICE KIT: 1.0000 | PACK | 0 refills | Status: DC

## 2019-10-17 NOTE — Telephone Encounter (Signed)
Received a call from [facility] pharmacist stating that [place] was covered by insurance with a $0 copay. Prodigy glucometer kit will be shipped to patient within upcoming 5-10 business days.  Called pt to inform her Prodigy glucometer has been approved for a $0 copay and estimated shipping time period. Pt expressed appreciation and verbalized understanding.  Plan to follow up with [employer] and [employer] by Thursday 10/20/19 to confirm patient assistance applications have been received.

## 2019-10-17 NOTE — Telephone Encounter (Signed)
Contacted multiple DME pharmacies ([facility], [facility]); all of which do not stock Prodigy blood glucose meter.  [place] to determine how to ensure insurance coverage of Prodigy glucometer. Representative stated that Prodigy glucometer is covered under [employer] Part D of patient's insurance plan. Explained to representative that Family Medicine clinic received a rejection from prior authorization submitted stating Prodigy glucometer must be billed under Medicare Part B. Representative transferred me to pharmacy, which confirmed representative's statement. Pharmacist took a verbal prescription order for Prodigy glucometer and affiliated blood glucose test strips. Pharmacist will contact me in ~1 hour to follow up to let me know if there will be a copay for the patient.

## 2019-10-17 NOTE — Telephone Encounter (Signed)
Sarah Kline,  Thank you for bringing this to my attention. I apologize as drug reps on the phone last week did not state this prescription would be covered via medicare part B. I will write a prescription to a DME pharmacy and follow up to see about insurance coverage.  Thanks! Stanton Kidney

## 2019-10-17 NOTE — Addendum Note (Signed)
Addended by: Ellwood Handler on: 10/17/2019 05:46 PM   Modules accepted: Orders

## 2019-10-17 NOTE — Telephone Encounter (Signed)
Received fax from [facility] that prodigy meter and strips would be covered under Medicare part b (form in Dr. Cline Crock box)  Stanton Kidney, Is there something else I need to do to follow up?  Christen Bame, CMA

## 2019-10-17 NOTE — Addendum Note (Signed)
Addended by: Ellwood Handler on: 10/17/2019 04:13 PM   Modules accepted: Orders

## 2019-10-18 DIAGNOSIS — N184 Chronic kidney disease, stage 4 (severe): Secondary | ICD-10-CM | POA: Diagnosis not present

## 2019-10-25 DIAGNOSIS — N184 Chronic kidney disease, stage 4 (severe): Secondary | ICD-10-CM | POA: Diagnosis not present

## 2019-10-25 DIAGNOSIS — D631 Anemia in chronic kidney disease: Secondary | ICD-10-CM | POA: Diagnosis not present

## 2019-10-25 DIAGNOSIS — E1122 Type 2 diabetes mellitus with diabetic chronic kidney disease: Secondary | ICD-10-CM | POA: Diagnosis not present

## 2019-10-25 DIAGNOSIS — R809 Proteinuria, unspecified: Secondary | ICD-10-CM | POA: Diagnosis not present

## 2019-10-25 DIAGNOSIS — N2589 Other disorders resulting from impaired renal tubular function: Secondary | ICD-10-CM | POA: Diagnosis not present

## 2019-10-25 DIAGNOSIS — N2581 Secondary hyperparathyroidism of renal origin: Secondary | ICD-10-CM | POA: Diagnosis not present

## 2019-10-25 DIAGNOSIS — I129 Hypertensive chronic kidney disease with stage 1 through stage 4 chronic kidney disease, or unspecified chronic kidney disease: Secondary | ICD-10-CM | POA: Diagnosis not present

## 2019-12-08 ENCOUNTER — Other Ambulatory Visit: Payer: Self-pay

## 2019-12-08 ENCOUNTER — Ambulatory Visit (INDEPENDENT_AMBULATORY_CARE_PROVIDER_SITE_OTHER): Payer: Medicare PPO | Admitting: Pharmacist

## 2019-12-08 ENCOUNTER — Encounter: Payer: Self-pay | Admitting: Pharmacist

## 2019-12-08 DIAGNOSIS — E1122 Type 2 diabetes mellitus with diabetic chronic kidney disease: Secondary | ICD-10-CM

## 2019-12-08 NOTE — Patient Instructions (Addendum)
It was a pleasure seeing you today!  1. Today we will plan to continue Trulicity 4.85 mg injection once weekly. Continue to check blood sugars and work on diet.  2. I Stanton Kidney) will give you a call in about 1 month to check in to see how your blood sugars are doing. We can determine at that time if you can continue to work on diet or if you would like to increase your Trulicity dose.  Please call the Family Medicine clinic with any questions concerns

## 2019-12-08 NOTE — Assessment & Plan Note (Signed)
Diabetes longstanding with average control and ability to improve. Patient is adherent with medication. Control is suboptimal due to patient's diet/weight gain. Discussed with patient increasing Trulicity dose vs focusing on diet. Patient agrees with husband that she could improve her diet and focus on eating smaller portions. -Continue to monitor BG readings daily  -Increase efforts in improving diet. -Continue GLP-1 Trulicity (generic name dulaglutide) 0.75 mg weekly (Mondays)  -Extensively discussed pathophysiology of diabetes, recommended lifestyle interventions, dietary effects on blood sugar control -Counseled on s/sx of and management of hypoglycemia -Next A1C anticipated 03/2020 (considering COVID-19 pandemic -Pharmacist will reach out to you in about 1 month to re-assess DM management.

## 2019-12-08 NOTE — Progress Notes (Signed)
S:     Chief Complaint  Patient presents with  . Medication Management    DM    Patient arrives ambulating with assistance from her husband.  Presents for diabetes evaluation, education, and management. Patient was referred and last seen by Primary Care Provider, Dr. Tammi Klippel, on 10/13/2019.   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 Prodigy BG meter "talking BG meter" was recently prescribed to patient to help ease process of checking BG. Patient states she doesn't like Prodigy BG meter as much as she thought she would considering how challenging it is to check BG as she is blind. She finds it difficult to determine which end of the BG strip should be put in the meter and how to determine if she has a sufficient blood supply to use on test strip. Her husband reports that he likes the Prodigy BG meter and finds it helpful when assisting her with checking her BG. Patient's husband reports he thinks her BG readings could be better, but considering the holidays they have been elevated. He thinks she could make improvements with her diet.  Patient reports experiencing incontinent episodes, specifically two episodes around the holidays where she laughed and had an incontinent episode. She reports increases in frequency of feeling incontinent daily as welll. She Patient's husband reports she has been drinking more caffeinated beverages.   Insurance coverage/medication affordability: [employer]  Patient reports adherence with medications.  Current diabetes medications include: Trulicity 3.81 mg once weekly (Mondays) Current hypertension medications include: amlodipine 10 mg daily, carvedilol 3.125 mg BID, hydralazine 10 mg TID, clonidine 0.1 mg BID Current hyperlipidemia medications include: atorvastatin 80 mg daily  Patient denies hypoglycemic events.    O:  Physical Exam Vitals reviewed.  Constitutional:      Appearance: Normal appearance. She is normal  weight.  Psychiatric:        Mood and Affect: Mood normal.        Behavior: Behavior normal.        Thought Content: Thought content normal.        Judgment: Judgment normal.      Review of Systems  Genitourinary: Positive for urgency.       Reports incontinent episodes     Lab Results  Component Value Date   HGBA1C 8.5 (A) 10/13/2019   Vitals:   12/08/19 1001  BP: (!) 145/74  Pulse: 73    Lipid Panel     Component Value Date/Time   CHOL 181 01/20/2017 0450   TRIG 57 01/20/2017 0450   HDL 40 (L) 01/20/2017 0450   CHOLHDL 4.5 01/20/2017 0450   VLDL 11 01/20/2017 0450   LDLCALC 130 (H) 01/20/2017 0450    Home fasting blood sugars: 140-160s .   Clinical Atherosclerotic Cardiovascular Disease (ASCVD): No  The 10-year ASCVD risk score Mikey Bussing DC Jr., et al., 2013) is: 20.9%   Values used to calculate the score:     Age: 78 years     Sex: Female     Is Non-Hispanic African American: Yes     Diabetic: Yes     Tobacco smoker: No     Systolic Blood Pressure: 017 mmHg     Is BP treated: Yes     HDL Cholesterol: 40 mg/dL     Total Cholesterol: 181 mg/dL    A/P: Diabetes longstanding with average control and ability to improve. Patient is adherent with medication. Control is suboptimal due to patient's diet/weight  gain. Discussed with patient increasing Trulicity dose vs focusing on diet. Patient agrees with husband that she could improve her diet and focus on eating smaller portions. -Continue to monitor BG readings daily  -Increase efforts in improving diet. -Continue GLP-1 Trulicity (generic name dulaglutide) 0.75 mg weekly (Mondays)  -Extensively discussed pathophysiology of diabetes, recommended lifestyle interventions, dietary effects on blood sugar control -Counseled on s/sx of and management of hypoglycemia -Next A1C anticipated 03/2020 (considering COVID-19 pandemic -Pharmacist will reach out to you in about 1 month to re-assess DM management.   ASCVD risk -  primary prevention in patient with diabetes. Last LDL is not controlled, however is from 2018. ASCVD risk score is slightly >20%  - High intensity statin indicated. Aspirin could be considered in the patient (bleeding risk low) -Continue aspirin 81 mg  -Continue atorvastatin 80 mg -Obtain follow up lipid panel at future appt  Hypertension longstanding currently elevated.  Blood pressure goal = 145/74 mmHg. Patient medication adherence reported.  Blood pressure control is suboptimal due to discussing how she is upset with lack of independence with Prodigy BG meter and increases in caffeinated beverages. -Decrease caffeinated beverages and continue to monitor -Continue current HTN regimen (amlodipine 10 mg daily, carvedilol 3.125 mg BID, hydralazine 10 mg TID, clonidine 0.1 mg BID)  New complaint of increased urination and incontinence - possibly related to caffeine intake.  Reevaluate next PCP visit.  Consider Mirabegron.   Written patient instructions provided.  Total time in face to face counseling 20 minutes.   Follow up Pharmacist via telephone in 1 month. Patient seen with [name], PharmD PGY2 Ambulatory Care Pharmacy Resident and Dr. Valentina Lucks

## 2019-12-08 NOTE — Progress Notes (Signed)
Reviewed: I agree with Dr. Koval's documentation and management. 

## 2019-12-22 ENCOUNTER — Other Ambulatory Visit: Payer: Self-pay | Admitting: Family Medicine

## 2019-12-22 DIAGNOSIS — Z1231 Encounter for screening mammogram for malignant neoplasm of breast: Secondary | ICD-10-CM

## 2020-01-12 ENCOUNTER — Telehealth: Payer: Self-pay | Admitting: Pharmacist

## 2020-01-12 NOTE — Telephone Encounter (Signed)
Called patient on 01/12/2020 at 10:55 AM   Patient states she has not checked any BG readings since last pharmacy appt (12/08/2019). Patient remains tolerating Trulicity. She states she feels she has been successful with implementing healthier dietary habits since last appt. She states everything is "going well". Asked patient if she would be willing to check BG readings and then if I could call to further discuss DM management in 2-3 weeks. Patient verbalized understanding and is agreeable to plan.  Thank you for involving pharmacy to assist in providing this patient's care.   [name], PharmD PGY2 Ambulatory Care Pharmacy Resident

## 2020-01-13 ENCOUNTER — Other Ambulatory Visit: Payer: Self-pay | Admitting: Family Medicine

## 2020-01-13 DIAGNOSIS — I10 Essential (primary) hypertension: Secondary | ICD-10-CM

## 2020-01-26 ENCOUNTER — Telehealth: Payer: Self-pay | Admitting: Pharmacist

## 2020-01-26 MED ORDER — TRULICITY 1.5 MG/0.5ML ~~LOC~~ SOAJ: 1.5000 mg | SUBCUTANEOUS | 0 refills | Status: DC

## 2020-01-26 NOTE — Telephone Encounter (Signed)
Contacted patient to discuss blood sugar readings.   She reports blood sugar readings in the 200s.  Denies intolerance with Trulicity 0.75mg  once weekly.   Patient agreed to increase dose of Trulicity to 1.5mg  once weekly.  (She will take two shot at next dose interval - Sunday - 4 days from now).   We agreed to send paperwork for higher dose to [facility].  New dose of Trulicity 1.5mg  once weekly reapplication sent via fax.   Plan phone follow-up in 10-14 days.

## 2020-01-27 ENCOUNTER — Other Ambulatory Visit: Payer: Self-pay

## 2020-01-27 ENCOUNTER — Ambulatory Visit
Admission: RE | Admit: 2020-01-27 | Discharge: 2020-01-27 | Disposition: A | Discharge status: Home / Self Care | Payer: Medicare PPO | Source: Ambulatory Visit | Attending: Family Medicine | Admitting: Family Medicine

## 2020-01-27 DIAGNOSIS — Z1231 Encounter for screening mammogram for malignant neoplasm of breast: Secondary | ICD-10-CM | POA: Diagnosis not present

## 2020-01-27 NOTE — Telephone Encounter (Signed)
Noted and agree. 

## 2020-02-01 ENCOUNTER — Other Ambulatory Visit: Payer: Self-pay | Admitting: *Deleted

## 2020-02-01 DIAGNOSIS — R928 Other abnormal and inconclusive findings on diagnostic imaging of breast: Secondary | ICD-10-CM

## 2020-02-02 ENCOUNTER — Telehealth: Payer: Self-pay | Admitting: Pharmacist

## 2020-02-02 NOTE — Telephone Encounter (Signed)
Called patient on 02/02/2020 at 1:37 PM   Patient states her BG readings have improved significantly after increasing Trulicity dose from 6.14 mg to 1.5 mg weekly on Monday (01/30/2020). She appears to be tolerating dose increase (does not complain of any side effects).  BG reading prior to dose increase - 269 mg/dL  BG reading after dose increase - 180, 150 mg/dL  Continue Trulicity 1.5 mg subQ weekly (Mondays)  Will follow up in 1 month to ensure patient is tolerating Trulicity and to confirm she received increased dose from [facility].  Thank you for involving pharmacy to assist in providing this patient's care.   [name], PharmD PGY2 Ambulatory Care Pharmacy Resident

## 2020-02-13 ENCOUNTER — Other Ambulatory Visit: Payer: Self-pay | Admitting: Family Medicine

## 2020-02-13 DIAGNOSIS — R928 Other abnormal and inconclusive findings on diagnostic imaging of breast: Secondary | ICD-10-CM

## 2020-02-15 ENCOUNTER — Ambulatory Visit: Payer: Medicare PPO

## 2020-02-15 ENCOUNTER — Ambulatory Visit
Admission: RE | Admit: 2020-02-15 | Discharge: 2020-02-15 | Disposition: A | Discharge status: Home / Self Care | Payer: Medicare PPO | Source: Ambulatory Visit | Attending: *Deleted | Admitting: *Deleted

## 2020-02-15 ENCOUNTER — Other Ambulatory Visit: Payer: Self-pay

## 2020-02-15 DIAGNOSIS — R928 Other abnormal and inconclusive findings on diagnostic imaging of breast: Secondary | ICD-10-CM | POA: Diagnosis not present

## 2020-02-21 DIAGNOSIS — N2581 Secondary hyperparathyroidism of renal origin: Secondary | ICD-10-CM | POA: Diagnosis not present

## 2020-02-21 DIAGNOSIS — R809 Proteinuria, unspecified: Secondary | ICD-10-CM | POA: Diagnosis not present

## 2020-02-21 DIAGNOSIS — I129 Hypertensive chronic kidney disease with stage 1 through stage 4 chronic kidney disease, or unspecified chronic kidney disease: Secondary | ICD-10-CM | POA: Diagnosis not present

## 2020-02-21 DIAGNOSIS — D631 Anemia in chronic kidney disease: Secondary | ICD-10-CM | POA: Diagnosis not present

## 2020-02-21 DIAGNOSIS — N184 Chronic kidney disease, stage 4 (severe): Secondary | ICD-10-CM | POA: Diagnosis not present

## 2020-02-21 DIAGNOSIS — N2589 Other disorders resulting from impaired renal tubular function: Secondary | ICD-10-CM | POA: Diagnosis not present

## 2020-02-21 DIAGNOSIS — E1122 Type 2 diabetes mellitus with diabetic chronic kidney disease: Secondary | ICD-10-CM | POA: Diagnosis not present

## 2020-02-29 ENCOUNTER — Telehealth: Payer: Self-pay | Admitting: Pharmacist

## 2020-02-29 NOTE — Telephone Encounter (Signed)
Called patient on 02/29/2020 at 12:32 PM   Patient states she continues to administer two 0.75 mg injections of Trulicity once weekly. She still has not received new prescription of Trulicity 1.5 mg subQ once weekly in the mail. She remains tolerating increased dose of Trulicity - no GI side effects. Her fasting BG range between 100-200 mg/dL. She checks her BG in the morning a few times each week. She states her BG is usually in the lower 100s earlier in the week and closer to 200 later on in the week. Today she states her fasting blood glucose reading was 120 mg/dL.   [facility] has not received faxed. Representative transferred me to [facility] pharmacist. Provided pharmacist (Renea) a verbal for Trulicity 1.5 mg subQ once weekly prescription. Pharmacist stated that patient should receive the new rx via the mail. [facility] representative will contact her prior to shipment.   Called patient back to notify her that increased dose prescription has been successfully received by [facility] and expected timeframe to receive new rx. Patient did not answer. LVM. Will contact patient in ~1 month to ensure she received new rx successfully.  Thank you for involving pharmacy to assist in providing this patient's care.   [name], PharmD PGY2 Ambulatory Care Pharmacy Resident

## 2020-03-05 DIAGNOSIS — N184 Chronic kidney disease, stage 4 (severe): Secondary | ICD-10-CM | POA: Diagnosis not present

## 2020-03-30 ENCOUNTER — Telehealth: Payer: Self-pay | Admitting: Pharmacist

## 2020-03-30 NOTE — Telephone Encounter (Signed)
Called patient on 03/30/2020 at 3:49 PM   Patient confirms she has received Trulicity 1.5 mg via [facility] successfully. Her BG readings remain 100-200 mg/dL. She is not experiencing any issues. Appears last A1c was 8.5 in 10/2019 - advised her to follow up with PCP next to re-assess A1c. A1c likely has improved considering BG readings. Patient will make appt early next week. Will successfully discharge pt from PharmD clinic at this time.  Thank you for involving pharmacy to assist in providing this patient's care.   [name], PharmD PGY2 Ambulatory Care Pharmacy Resident

## 2020-04-15 NOTE — Progress Notes (Signed)
    SUBJECTIVE:   CHIEF COMPLAINT / HPI:   Hypertension: - Medications: norvasc 10, carvedilol 3.125 bid, clonidine 0.1 bid - Compliance: good  - Checking BP at home: no - Denies any SOB, CP, vision changes, LE edema, medication SEs, or symptoms of hypotension - Diet: see below  - Exercise: see below   Diabetes Fasting checks: always under 130, usually 119-123 Post prandial does not check   Compliance: good  Diet: the same   Exercise: not lately, but was  Foot exam: will perform today A1C: 7.1 Symptoms: no symptoms of hypoglycemia. no symptoms of  polyuria, polydipsia. no numbness in extremities, and no foot ulcers/trauma Meds: Trulicity 1.5 mg  Pneumonia vaccine: UTD  Urine micro albumin:creatine ratio: sees nephrology  Statin: atorvastatin 80mg    Monitoring Labs and Parameters Last A1C:  Lab Results  Component Value Date   HGBA1C 7.1 (A) 04/16/2020   Last Lipid:     Component Value Date/Time   CHOL 181 01/20/2017 0450   HDL 40 (L) 01/20/2017 0450   Last Bmet  Potassium  Date Value Ref Range Status  10/13/2019 4.9 3.5 - 5.2 mmol/L Final   Sodium  Date Value Ref Range Status  10/13/2019 140 134 - 144 mmol/L Final   Creat  Date Value Ref Range Status  02/13/2017 1.73 (H) 0.50 - 1.05 mg/dL Final    Comment:      For patients > or = 56 years of age: The upper reference limit for Creatinine is approximately 13% higher for people identified as African-American.      Creatinine, Ser  Date Value Ref Range Status  10/13/2019 2.59 (H) 0.57 - 1.00 mg/dL Final     Hyperlipidemia Meds: atorvastatin 80 Diet: see above  Exercise: see above    PERTINENT  PMH / PSH: HTN, DM, HLD, legally blind   OBJECTIVE:   BP 116/80   Pulse 79   Ht 5\' 4"  (1.626 m)   Wt 242 lb 12.8 oz (110.1 kg)   LMP 04/28/2018   SpO2 98%   BMI 41.68 kg/m   Gen: awake and alert, NAD Cardio: RRR, no MRG Resp: CTAB, no wheezes, rales or rhonchi GI: soft, non tender, non distended,  bowel sounds present Ext: no edema Foot exam: normal touch and monofilament testing. 2+ pulses bilaterally   ASSESSMENT/PLAN:   HTN (hypertension) Well-controlled.  We will continue current medications.  Hyperlipidemia associated with type 2 diabetes mellitus ([place]) Patient is noncompliant on her atorvastatin.  States she is worried about her kidneys.  She states she will discuss with nephrologist and if he is okay with her taking atorvastatin will start this.  Diabetes mellitus ([place]) Well-controlled on her new Trulicity dose.  We will continue this.  Patient is receiving this through [facility] and has refills.  Follow-up in 3 months.     Sarah Kline, [place]

## 2020-04-16 ENCOUNTER — Other Ambulatory Visit: Payer: Self-pay

## 2020-04-16 ENCOUNTER — Ambulatory Visit (INDEPENDENT_AMBULATORY_CARE_PROVIDER_SITE_OTHER): Payer: Medicare PPO | Admitting: Family Medicine

## 2020-04-16 ENCOUNTER — Encounter: Payer: Self-pay | Admitting: Family Medicine

## 2020-04-16 VITALS — BP 116/80 | HR 79 | Ht 64.0 in | Wt 242.8 lb

## 2020-04-16 DIAGNOSIS — I10 Essential (primary) hypertension: Secondary | ICD-10-CM

## 2020-04-16 DIAGNOSIS — E785 Hyperlipidemia, unspecified: Secondary | ICD-10-CM

## 2020-04-16 DIAGNOSIS — E1129 Type 2 diabetes mellitus with other diabetic kidney complication: Secondary | ICD-10-CM | POA: Diagnosis not present

## 2020-04-16 DIAGNOSIS — E1169 Type 2 diabetes mellitus with other specified complication: Secondary | ICD-10-CM | POA: Diagnosis not present

## 2020-04-16 LAB — POCT GLYCOSYLATED HEMOGLOBIN (HGB A1C): HbA1c, POC (controlled diabetic range): 7.1 % — AB (ref 0.0–7.0)

## 2020-04-16 NOTE — Assessment & Plan Note (Signed)
Patient is noncompliant on her atorvastatin.  States she is worried about her kidneys.  She states she will discuss with nephrologist and if he is okay with her taking atorvastatin will start this.

## 2020-04-16 NOTE — Assessment & Plan Note (Signed)
Well-controlled.  We will continue current medications.

## 2020-04-16 NOTE — Assessment & Plan Note (Signed)
Well-controlled on her new Trulicity dose.  We will continue this.  Patient is receiving this through [facility] and has refills.  Follow-up in 3 months.

## 2020-04-16 NOTE — Patient Instructions (Signed)
  Diet Recommendations for Diabetes   Starchy (carb) foods: Bread, rice, pasta, potatoes, corn, cereal, grits, crackers, bagels, muffins, all baked goods.  (Fruits, milk, and yogurt also have carbohydrate, but most of these foods will not spike your blood sugar as the starchy foods will.)  A few fruits do cause high blood sugars; use small portions of bananas (limit to 1/2 at a time), grapes, watermelon, oranges, and most tropical fruits.    Protein foods: Meat, fish, poultry, eggs, dairy foods, and beans such as pinto and kidney beans (beans also provide carbohydrate).   1. Eat at least 3 meals and 1-2 snacks per day. Never go more than 4-5 hours while awake without eating. Eat breakfast within the first hour of getting up.   2. Limit starchy foods to TWO per meal and ONE per snack. ONE portion of a starchy  food is equal to the following:   - ONE slice of bread (or its equivalent, such as half of a hamburger bun).   - 1/2 cup of a "scoopable" starchy food such as potatoes or rice.   - 15 grams of carbohydrate as shown on food label.  3. Include at every meal: a protein food, a carb food, and vegetables and/or fruit.   - Obtain twice the volume of veg's as protein or carbohydrate foods for both lunch and dinner.   - Fresh or frozen veg's are best.   - Keep frozen veg's on hand for a quick vegetable serving.      

## 2020-06-08 ENCOUNTER — Other Ambulatory Visit: Payer: Self-pay | Admitting: Family Medicine

## 2020-06-08 DIAGNOSIS — I10 Essential (primary) hypertension: Secondary | ICD-10-CM

## 2020-06-29 DIAGNOSIS — N184 Chronic kidney disease, stage 4 (severe): Secondary | ICD-10-CM | POA: Diagnosis not present

## 2020-07-03 DIAGNOSIS — R809 Proteinuria, unspecified: Secondary | ICD-10-CM | POA: Diagnosis not present

## 2020-07-03 DIAGNOSIS — N2581 Secondary hyperparathyroidism of renal origin: Secondary | ICD-10-CM | POA: Diagnosis not present

## 2020-07-03 DIAGNOSIS — D631 Anemia in chronic kidney disease: Secondary | ICD-10-CM | POA: Diagnosis not present

## 2020-07-03 DIAGNOSIS — N184 Chronic kidney disease, stage 4 (severe): Secondary | ICD-10-CM | POA: Diagnosis not present

## 2020-07-03 DIAGNOSIS — I129 Hypertensive chronic kidney disease with stage 1 through stage 4 chronic kidney disease, or unspecified chronic kidney disease: Secondary | ICD-10-CM | POA: Diagnosis not present

## 2020-07-03 DIAGNOSIS — N2589 Other disorders resulting from impaired renal tubular function: Secondary | ICD-10-CM | POA: Diagnosis not present

## 2020-07-03 DIAGNOSIS — E1122 Type 2 diabetes mellitus with diabetic chronic kidney disease: Secondary | ICD-10-CM | POA: Diagnosis not present

## 2020-07-10 DIAGNOSIS — I129 Hypertensive chronic kidney disease with stage 1 through stage 4 chronic kidney disease, or unspecified chronic kidney disease: Secondary | ICD-10-CM | POA: Diagnosis not present

## 2020-07-10 DIAGNOSIS — N184 Chronic kidney disease, stage 4 (severe): Secondary | ICD-10-CM | POA: Diagnosis not present

## 2020-08-15 NOTE — Progress Notes (Signed)
[facility] Note  08/16/2020     CHIEF COMPLAINT Patient presents for Retina Follow Up   HISTORY OF PRESENT ILLNESS: Sarah Kline is a 54 y.o. female who presents to the clinic today for:   HPI    Retina Follow Up    Patient presents with  Diabetic Retinopathy.  In both eyes.  This started 13 months ago.  Severity is moderate.  I, the attending physician,  performed the HPI with the patient and updated documentation appropriately.          Comments    Patient here for retina follow up for PDR OU. Patient states has no vision. No eye pain now. Has had some eye pain 3 days ago.       Last edited by Bernarda Caffey, MD on 08/16/2020 10:38 AM. (History)    pt states states no change in vision since last visit, she states [facility] never contacted her, she has no new health problems, she is just here for a yearly check   Referring physician: Eulis Foster, [place]. [place],   [address]  HISTORICAL INFORMATION:   Selected notes from the MEDICAL RECORD NUMBER Referred by Dr. Caroline More for DM exam LEE:  Ocular Hx- PMH-blind, HTN, DM (A1c: 7.6, 41.96.22, takes trulicity)  Last visit with Dr. 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 Difficult case with large RetEx - s/p SO removal/PPV/MP/EL/SO 12/08/16 - large RetEx extended with dense membrane removed - Retina appears flat, IOP WNL  rtc 4-6 mos      CURRENT MEDICATIONS: No current outpatient medications on file. (Ophthalmic Drugs)   No current facility-administered medications for this visit. (Ophthalmic Drugs)   Current Outpatient Medications (Other)  Medication Sig  . amLODipine (NORVASC) 10 MG tablet TAKE 1 TABLET (10 MG TOTAL) BY MOUTH EVERY EVENING.  . ASPIRIN LOW DOSE 81 MG EC tablet TAKE 1 TABLET BY MOUTH EVERY DAY  . atorvastatin (LIPITOR) 80 MG tablet TAKE 1 TABLET BY MOUTH DAILY AT 6 PM  . Blood Glucose Monitoring Suppl (PRODIGY AUTOCODE BLOOD GLUCOSE) w/Device KIT 1 kit by Does not apply route as directed.  . calcitRIOL (ROCALTROL) 0.25 MCG capsule TAKE 1 CAPSULE EVERY DAY  . calcium-vitamin D (OSCAL WITH D) 500-200 MG-UNIT tablet Take 1 tablet by mouth.  . carvedilol (COREG) 3.125 MG tablet TAKE 1 TABLET BY MOUTH 2 TIMES DAILY WITH FOOD  . cloNIDine (CATAPRES) 0.1 MG tablet TAKE 1 TABLET TWICE DAILY  . Dulaglutide (TRULICITY) 1.5 WL/7.9GX SOPN Inject 1.5 mg into the skin once a week.  . hydrALAZINE (APRESOLINE) 10 MG tablet TAKE 1 TABLET THREE TIMES DAILY  . ketoconazole (NIZORAL) 2 % cream Apply 1 application topically daily.  . sodium bicarbonate 650 MG tablet Take 1,300 mg by mouth 2 (two) times daily.   No current facility-administered medications for this visit. (Other)      REVIEW OF SYSTEMS:  ROS    Positive for: Neurological, Endocrine, Eyes   Negative for: Constitutional, Gastrointestinal, Skin, Genitourinary, Musculoskeletal, HENT, Cardiovascular, Respiratory, Psychiatric, Allergic/Imm, Heme/Lymph   Last edited by Theodore Demark, COA on 08/16/2020  9:28 AM. (History)       ALLERGIES Allergies  Allergen Reactions  . Bactrim [Sulfamethoxazole-Trimethoprim] Rash    PAST MEDICAL HISTORY Past Medical History:  Diagnosis Date  . Blind   . Diabetes mellitus   . Hypertension   . Sickle cell disease, type S [facility])    Past Surgical History:  Procedure Laterality Date  . EYE SURGERY Left     retinal detachment repair 2017  . EYE SURGERY Left    ? type of surgery to repair blood vessels 2018  . GANGLION CYST EXCISION     Left dorsal wrist  . RETINAL DETACHMENT SURGERY Left    2017, [facility]    FAMILY HISTORY Family History  Problem Relation Age of Onset  . Asthma Mother   . COPD Mother   . Hypertension Mother   . Stroke Mother   . Diabetes Son   . Diabetes Daughter     SOCIAL HISTORY Social History   Tobacco Use  . Smoking status: Never Smoker  . Smokeless tobacco: Never Used  Substance Use Topics  . Alcohol use: Yes    Alcohol/week: 2.0 standard drinks    Types: 2 Shots of liquor per week    Comment: twice per month  . Drug use: No    Comment: Very remote marijuana use, 20 years ago         OPHTHALMIC EXAM:  Base Eye Exam    Visual Acuity (Snellen - Linear)      Right Left   Dist [place] NLP LP       Tonometry (Tonopen, 9:23 AM)      Right Left   Pressure 13 16       Pupils      Dark Light Shape React APD   Right 2 2 Round none None   Left 3 2.5 Round Minimal None  OD hazy       Visual Fields      Left Right   Restrictions Total superior temporal, inferior temporal, superior nasal, inferior nasal deficiencies Total superior temporal, inferior temporal, superior nasal, inferior nasal deficiencies       Extraocular Movement   unable       Neuro/Psych    Oriented x3: Yes   Mood/Affect: Normal       Dilation    Both eyes: 1.0% Mydriacyl, 2.5% Phenylephrine @ 9:23 AM        Slit Lamp and Fundus Exam    Slit Lamp Exam      Right Left   Lids/Lashes Dermatochalasis - upper lid Dermatochalasis - upper lid   Conjunctiva/Sclera Melanosis Melanosis   Cornea pigmented Arcus, 1-2+ Punctate epithelial erosions Arcus, nylon suture at 0130, 1+ inferior Punctate epithelial erosions, Well healed cataract wounds   Anterior Chamber deep and clear deep and clear   Iris 360 Posterior synechiae, No NVI Round and poorly dilated to 3.75m, No NVI    Lens white mature cataract with fibrotic pigment anteriorly PC IOL in good position, trace Posterior capsular opacification   Vitreous no view post vitrectomy, silicone oil       Fundus Exam      Right Left   Disc no view 4+ Pallor, Sharp rim   C/D Ratio  0.2   Macula no view Extensive  pigment and fibrosis under oil   Vessels no view Severe Vascular attenuation, sclerotic vessels superiorly   Periphery no view Extensive fibrosis, chronic TRD under oil, scattered blot heme          IMAGING AND PROCEDURES  Imaging and Procedures for _0 @  OCT, Retina - OU - Both Eyes       Right Eye Quality was poor. Progression has no prior data. Findings include (No image).   Left Eye Quality was poor. Progression has no prior data. Findings include abnormal foveal contour, outer retinal atrophy, subretinal hyper-reflective material, preretinal fibrosis, inner retinal atrophy, vitreous traction (Only single line widefield image obtained).   Notes *Images captured and stored on drive  Diagnosis / Impression:  OD: no image obtained OS: Only single line widefield image obtained -- diffuse atrophy and fibrosis Poor image quality  Clinical management:  See below  Abbreviations: NFP - Normal foveal profile. CME - cystoid macular edema. PED - pigment epithelial detachment. IRF - intraretinal fluid. SRF - subretinal fluid. EZ - ellipsoid zone. ERM - epiretinal membrane. ORA - outer retinal atrophy. ORT - outer retinal tubulation. SRHM - subretinal hyper-reflective material                 ASSESSMENT/PLAN:    ICD-10-CM   1. 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 Retinal edema  H35.81 OCT, Retina - OU - Both Eyes  3. Mature cataract  H26.8   4. Posterior synechiae of right eye  H21.541   5. Pseudophakia  Z96.1   6. 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 Proliferative diabetic retinopathy w/ TRD OU (OD > OS)  - pt with extensive history of retinal care at [facility] with Dr. Jacklynn Ganong  - s/p PPV w/ membrane peel and silicon oil x2 OS (06/9727, and 12/2016)  - pt presents today for yearly check -- delayed from recommended 3 mo f/u  - pt reports no acute changes in vision and reports eyes have been stable and pain free since last visit in August 2020  - BCVA NLP OD, LP OS  - OD no view of posterior pole due to 360 PS and mature white cataract, OCT OD shows fibrotic chronic TRD  - OS with severe ischemia and fibrosis under silicon oil -- limited visual potential, stable  - discussed findings and prognosis  - no intervention indicated or recommended at this time  - recommend evaluation with [facility]  - f/u 1 year, sooner prn  3,4. Mature cataract w/ 360 posterior synechiae OD  - low visual potential  - recommend monitoring  5. Pseudophakia OS  - s/p CE/IOL in 2017 with PPV  - PCIOL in good position  - monitor  6. Legal Blindness  - secondary to severe PDR with TRDs OU and mature cataract OD  - will refer to [facility] Services for the [place]: [place], [place], [place], [place], [place], [place], [place], [place], [place], [place], [place], [place]  Phone: [phone]  Toll Free: [phone]  Fax: [phone]  Email: [email or web address]  Physical Address  [address] [place], [place] [address]    Ophthalmic Meds Ordered this visit:  No orders of the defined types were placed in this encounter.      Return in about 1 year (around 08/16/2021) for  f/u PDR OU, DFE, OCT.  There are no Patient Instructions on file for this visit.   Explained the diagnoses, plan, and follow up with the patient and they expressed understanding.  Patient expressed understanding of the importance of proper  follow up care.   This document serves as a record of services personally performed by Gardiner Sleeper, MD, PhD. It was created on their behalf by Leonie Douglas, an ophthalmic technician. The creation of this record is the provider's dictation and/or activities during the visit.    Electronically signed by: Leonie Douglas COA, 08/16/20  10:39 AM   This document serves as a record of services personally performed by Gardiner Sleeper, MD, PhD. It was created on their behalf by San Jetty. Owens Shark, OA an ophthalmic technician. The creation of this record is the provider's dictation and/or activities during the visit.    Electronically signed by: San Jetty. Marguerita Merles 09.16.2021 10:39 AM  Gardiner Sleeper, M.D., Ph.D. Diseases & Surgery of the Retina and Vitreous [facility]  I have reviewed the above documentation for accuracy and completeness, and I agree with the above. Gardiner Sleeper, M.D., Ph.D. 08/16/20 10:42 AM  Abbreviations: M myopia (nearsighted); A astigmatism; H hyperopia (farsighted); P presbyopia; Mrx spectacle prescription;  CTL contact lenses; OD right eye; OS left eye; OU both eyes  XT exotropia; ET esotropia; PEK punctate epithelial keratitis; PEE punctate epithelial erosions; DES dry eye syndrome; MGD meibomian gland dysfunction; ATs artificial tears; PFAT's preservative free artificial tears; [place] nuclear sclerotic cataract; PSC posterior subcapsular cataract; ERM epi-retinal membrane; PVD posterior vitreous detachment; RD retinal detachment; DM diabetes mellitus; DR diabetic retinopathy; NPDR non-proliferative diabetic retinopathy; PDR proliferative diabetic retinopathy; CSME clinically significant macular edema; DME diabetic macular edema; dbh dot blot hemorrhages; CWS cotton wool spot; POAG primary open angle glaucoma; C/D cup-to-disc ratio; HVF humphrey visual field; GVF goldmann visual field; OCT optical coherence tomography; IOP intraocular pressure; BRVO Branch  retinal vein occlusion; CRVO central retinal vein occlusion; CRAO central retinal artery occlusion; BRAO branch retinal artery occlusion; RT retinal tear; SB scleral buckle; PPV pars plana vitrectomy; VH Vitreous hemorrhage; PRP panretinal laser photocoagulation; IVK intravitreal kenalog; VMT vitreomacular traction; MH Macular hole;  NVD neovascularization of the disc; NVE neovascularization elsewhere; AREDS age related eye disease study; ARMD age related macular degeneration; POAG primary open angle glaucoma; EBMD epithelial/anterior basement membrane dystrophy; ACIOL anterior chamber intraocular lens; IOL intraocular lens; PCIOL posterior chamber intraocular lens; Phaco/IOL phacoemulsification with intraocular lens placement; [place] photorefractive keratectomy; LASIK laser assisted in situ keratomileusis; HTN hypertension; DM diabetes mellitus; COPD chronic obstructive pulmonary disease

## 2020-08-16 ENCOUNTER — Ambulatory Visit (INDEPENDENT_AMBULATORY_CARE_PROVIDER_SITE_OTHER): Payer: Medicare PPO | Admitting: Ophthalmology

## 2020-08-16 ENCOUNTER — Encounter (INDEPENDENT_AMBULATORY_CARE_PROVIDER_SITE_OTHER): Payer: Self-pay | Admitting: Ophthalmology

## 2020-08-16 ENCOUNTER — Other Ambulatory Visit: Payer: Self-pay

## 2020-08-16 DIAGNOSIS — H21541 Posterior synechiae (iris), right eye: Secondary | ICD-10-CM | POA: Diagnosis not present

## 2020-08-16 DIAGNOSIS — H3581 Retinal edema: Secondary | ICD-10-CM

## 2020-08-16 DIAGNOSIS — Z961 Presence of intraocular lens: Secondary | ICD-10-CM | POA: Diagnosis not present

## 2020-08-16 DIAGNOSIS — E113523 Type 2 diabetes mellitus with proliferative diabetic retinopathy with traction retinal detachment involving the macula, bilateral: Secondary | ICD-10-CM | POA: Diagnosis not present

## 2020-08-16 DIAGNOSIS — H268 Other specified cataract: Secondary | ICD-10-CM | POA: Diagnosis not present

## 2020-08-16 DIAGNOSIS — H548 Legal blindness, as defined in USA: Secondary | ICD-10-CM

## 2020-09-25 DIAGNOSIS — N184 Chronic kidney disease, stage 4 (severe): Secondary | ICD-10-CM | POA: Diagnosis not present

## 2020-10-02 DIAGNOSIS — R809 Proteinuria, unspecified: Secondary | ICD-10-CM | POA: Diagnosis not present

## 2020-10-02 DIAGNOSIS — E1122 Type 2 diabetes mellitus with diabetic chronic kidney disease: Secondary | ICD-10-CM | POA: Diagnosis not present

## 2020-10-02 DIAGNOSIS — N2581 Secondary hyperparathyroidism of renal origin: Secondary | ICD-10-CM | POA: Diagnosis not present

## 2020-10-02 DIAGNOSIS — I129 Hypertensive chronic kidney disease with stage 1 through stage 4 chronic kidney disease, or unspecified chronic kidney disease: Secondary | ICD-10-CM | POA: Diagnosis not present

## 2020-10-02 DIAGNOSIS — N2589 Other disorders resulting from impaired renal tubular function: Secondary | ICD-10-CM | POA: Diagnosis not present

## 2020-10-02 DIAGNOSIS — N184 Chronic kidney disease, stage 4 (severe): Secondary | ICD-10-CM | POA: Diagnosis not present

## 2020-10-02 DIAGNOSIS — D631 Anemia in chronic kidney disease: Secondary | ICD-10-CM | POA: Diagnosis not present

## 2020-10-16 DIAGNOSIS — I129 Hypertensive chronic kidney disease with stage 1 through stage 4 chronic kidney disease, or unspecified chronic kidney disease: Secondary | ICD-10-CM | POA: Diagnosis not present

## 2020-10-16 DIAGNOSIS — N184 Chronic kidney disease, stage 4 (severe): Secondary | ICD-10-CM | POA: Diagnosis not present

## 2020-10-26 ENCOUNTER — Other Ambulatory Visit: Payer: Self-pay | Admitting: Family Medicine

## 2020-10-26 DIAGNOSIS — I10 Essential (primary) hypertension: Secondary | ICD-10-CM

## 2020-10-29 ENCOUNTER — Other Ambulatory Visit: Payer: Self-pay | Admitting: *Deleted

## 2020-10-29 DIAGNOSIS — I10 Essential (primary) hypertension: Secondary | ICD-10-CM

## 2020-10-29 MED ORDER — CALCITRIOL 0.25 MCG PO CAPS: 0.2500 ug | ORAL_CAPSULE | Freq: Every day | ORAL | 3 refills | Status: DC

## 2020-10-29 MED ORDER — HYDRALAZINE HCL 10 MG PO TABS: 10.0000 mg | ORAL_TABLET | Freq: Three times a day (TID) | ORAL | 3 refills | Status: DC

## 2020-10-29 MED ORDER — CARVEDILOL 3.125 MG PO TABS: ORAL_TABLET | ORAL | 3 refills | Status: DC

## 2020-10-29 MED ORDER — AMLODIPINE BESYLATE 10 MG PO TABS: 10.0000 mg | ORAL_TABLET | Freq: Every evening | ORAL | 3 refills | Status: DC

## 2020-12-28 DIAGNOSIS — N184 Chronic kidney disease, stage 4 (severe): Secondary | ICD-10-CM | POA: Diagnosis not present

## 2021-01-04 DIAGNOSIS — D631 Anemia in chronic kidney disease: Secondary | ICD-10-CM | POA: Diagnosis not present

## 2021-01-04 DIAGNOSIS — I129 Hypertensive chronic kidney disease with stage 1 through stage 4 chronic kidney disease, or unspecified chronic kidney disease: Secondary | ICD-10-CM | POA: Diagnosis not present

## 2021-01-04 DIAGNOSIS — N2581 Secondary hyperparathyroidism of renal origin: Secondary | ICD-10-CM | POA: Diagnosis not present

## 2021-01-04 DIAGNOSIS — N2589 Other disorders resulting from impaired renal tubular function: Secondary | ICD-10-CM | POA: Diagnosis not present

## 2021-01-04 DIAGNOSIS — N184 Chronic kidney disease, stage 4 (severe): Secondary | ICD-10-CM | POA: Diagnosis not present

## 2021-01-04 DIAGNOSIS — E1122 Type 2 diabetes mellitus with diabetic chronic kidney disease: Secondary | ICD-10-CM | POA: Diagnosis not present

## 2021-01-04 DIAGNOSIS — R809 Proteinuria, unspecified: Secondary | ICD-10-CM | POA: Diagnosis not present

## 2021-02-26 ENCOUNTER — Other Ambulatory Visit: Payer: Self-pay | Admitting: Student

## 2021-02-26 ENCOUNTER — Ambulatory Visit
Admission: RE | Admit: 2021-02-26 | Discharge: 2021-02-26 | Disposition: A | Discharge status: Home / Self Care | Payer: Medicare PPO | Source: Ambulatory Visit | Attending: Student | Admitting: Student

## 2021-02-26 DIAGNOSIS — M542 Cervicalgia: Secondary | ICD-10-CM

## 2021-02-26 DIAGNOSIS — W19XXXA Unspecified fall, initial encounter: Secondary | ICD-10-CM

## 2021-04-25 ENCOUNTER — Other Ambulatory Visit: Payer: Self-pay | Admitting: Student

## 2021-04-25 ENCOUNTER — Other Ambulatory Visit: Payer: Self-pay

## 2021-04-25 ENCOUNTER — Ambulatory Visit
Admission: RE | Admit: 2021-04-25 | Discharge: 2021-04-25 | Disposition: A | Discharge status: Home / Self Care | Payer: Medicare HMO | Source: Ambulatory Visit | Attending: Student | Admitting: Student

## 2021-04-25 DIAGNOSIS — Z1231 Encounter for screening mammogram for malignant neoplasm of breast: Secondary | ICD-10-CM

## 2021-08-14 NOTE — Progress Notes (Signed)
[facility] Note  08/15/2021     CHIEF COMPLAINT Patient presents for Retina Follow Up   HISTORY OF PRESENT ILLNESS: Sarah Kline is a 55 y.o. female who presents to the clinic today for:   HPI     Retina Follow Up   Patient presents with  Diabetic Retinopathy.  In both eyes.  Severity is moderate.  Duration of 1 year.  I, the attending physician,  performed the HPI with the patient and updated documentation appropriately.        Comments   Patient states no change in vision OU. Last a1c was 6.4, checked 07.18.22.       Last edited by Bernarda Caffey, MD on 08/18/2021  2:40 AM.    pt states she has kidney disease, but is not on dialysis right now, she states [facility] gave her a few things to help her around the house   Referring physician: Eulis Foster, [place]. [place],  [place] [address]  HISTORICAL INFORMATION:   Selected notes from the MEDICAL RECORD NUMBER Referred by Dr. Caroline More for DM exam LEE:  Ocular Hx- PMH-blind, HTN, DM (A1c: 7.6, 33.35.45, takes trulicity)  Last visit with Dr. 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 Difficult case with large RetEx - s/p SO removal/PPV/MP/EL/SO 12/08/16 - large RetEx extended with dense membrane removed - Retina appears flat, IOP WNL  rtc 4-6 mos      CURRENT MEDICATIONS: No current outpatient medications on file. (Ophthalmic Drugs)   No  current facility-administered medications for this visit. (Ophthalmic Drugs)   Current Outpatient Medications (Other)  Medication Sig   amLODipine (NORVASC) 10 MG tablet Take 1 tablet (10 mg total) by mouth every evening.   ASPIRIN LOW DOSE 81 MG EC tablet TAKE 1 TABLET BY MOUTH EVERY DAY   atorvastatin (LIPITOR) 80 MG tablet TAKE 1 TABLET BY MOUTH DAILY AT 6 PM   Blood Glucose Monitoring Suppl (PRODIGY AUTOCODE BLOOD GLUCOSE) w/Device KIT 1 kit by Does not apply route as directed.   calcitRIOL (ROCALTROL) 0.25 MCG capsule Take 1 capsule (0.25 mcg total) by mouth daily.   calcium-vitamin D (OSCAL WITH D) 500-200 MG-UNIT tablet Take 1 tablet by mouth.   carvedilol (COREG) 3.125 MG tablet TAKE 1 TABLET BY MOUTH 2 TIMES DAILY WITH FOOD   cloNIDine (CATAPRES) 0.1 MG tablet TAKE 1 TABLET TWICE DAILY.   Dulaglutide (TRULICITY) 1.5 GY/5.6LS SOPN Inject 1.5 mg into the skin once a week.   hydrALAZINE (APRESOLINE) 10 MG tablet Take 1 tablet (10 mg total) by mouth 3 (three) times daily.   losartan (COZAAR) 50 MG tablet Take 50 mg by mouth daily.   sodium bicarbonate 650 MG tablet Take 1,300 mg by mouth 2 (two) times daily.   ketoconazole (NIZORAL) 2 % cream Apply 1 application topically daily. (Patient not taking: Reported on 08/15/2021)   No current facility-administered medications for this  visit. (Other)      REVIEW OF SYSTEMS: ROS   Positive for: Neurological, Endocrine, Eyes Negative for: Constitutional, Gastrointestinal, Skin, Genitourinary, Musculoskeletal, HENT, Cardiovascular, Respiratory, Psychiatric, Allergic/Imm, Heme/Lymph Last edited by Roselee Nova D, COT on 08/15/2021  9:01 AM.        ALLERGIES Allergies  Allergen Reactions   Bactrim [Sulfamethoxazole-Trimethoprim] Rash    PAST MEDICAL HISTORY Past Medical History:  Diagnosis Date   Blind    Diabetes mellitus    Hypertension    Sickle cell disease, type S ([place])    Past Surgical History:  Procedure Laterality Date    EYE SURGERY Left    retinal detachment repair 2017   EYE SURGERY Left    ? type of surgery to repair blood vessels 2018   GANGLION CYST EXCISION     Left dorsal wrist   RETINAL DETACHMENT SURGERY Left    2017, [facility]    FAMILY HISTORY Family History  Problem Relation Age of Onset   Asthma Mother    COPD Mother    Hypertension Mother    Stroke Mother    Diabetes Son    Diabetes Daughter     SOCIAL HISTORY Social History   Tobacco Use   Smoking status: Never   Smokeless tobacco: Never  Substance Use Topics   Alcohol use: Yes    Alcohol/week: 2.0 standard drinks    Types: 2 Shots of liquor per week    Comment: twice per month   Drug use: No    Comment: Very remote marijuana use, 20 years ago         OPHTHALMIC EXAM:  Base Eye Exam     Visual Acuity (Snellen - Linear)       Right Left   Dist [place] NLP LP         Tonometry (Tonopen, 9:10 AM)       Right Left   Pressure 17 15         Pupils       Dark Light Shape React APD   Right 2 2 Round None None   Left 2 2 Round None None         Visual Fields       Left Right   Restrictions Total superior temporal, inferior temporal, superior nasal, inferior nasal deficiencies Total superior temporal, inferior temporal, superior nasal, inferior nasal deficiencies         Extraocular Movement   unable        Neuro/Psych     Oriented x3: Yes   Mood/Affect: Normal           Slit Lamp and Fundus Exam     Slit Lamp Exam       Right Left   Lids/Lashes Dermatochalasis - upper lid Normal   Conjunctiva/Sclera Melanosis Melanosis   Cornea pigmented Arcus Arcus, nylon suture at 0130, 1+ inferior Punctate epithelial erosions, Well healed cataract wounds   Anterior Chamber deep and clear, moderate depth, very narrow angles deep and clear   Iris 360 Posterior synechiae, No NVI Round and moderately dilated, No NVI   Lens white mature cataract with fibrotic pigment anteriorly PC IOL in good  position, trace Posterior capsular opacification, silicone oil on posterior capsule, pigment on anterior optic   Vitreous no view post vitrectomy, silicone oil         Fundus Exam       Right Left   Disc no view 4+ Pallor, Sharp rim, attenuated vessels with  severe ischemia   C/D Ratio  0.2   Macula no view Extensive pigment and fibrosis under oil   Vessels no view Severe Vascular attenuation, sclerotic vessels superiorly   Periphery no view Extensive fibrosis, chronic TRD under oil            IMAGING AND PROCEDURES  Imaging and Procedures for '@TODAY' @  OCT, Retina - OU - Both Eyes       Right Eye Findings include (No image).   Left Eye Quality was poor. Central Foveal Thickness: 609. Progression has no prior data. Findings include abnormal foveal contour, outer retinal atrophy, subretinal hyper-reflective material, preretinal fibrosis, inner retinal atrophy, vitreous traction (Diffuse atrophy and tractional fibrosis/edema).   Notes *Images captured and stored on drive  Diagnosis / Impression:  OD: no image obtained OS: Diffuse atrophy and tractional fibrosis/edema Poor image quality  Clinical management:  See below  Abbreviations: NFP - Normal foveal profile. CME - cystoid macular edema. PED - pigment epithelial detachment. IRF - intraretinal fluid. SRF - subretinal fluid. EZ - ellipsoid zone. ERM - epiretinal membrane. ORA - outer retinal atrophy. ORT - outer retinal tubulation. SRHM - subretinal hyper-reflective material               ASSESSMENT/PLAN:    ICD-10-CM   1. 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 Retinal edema  H35.81 OCT, Retina - OU - Both Eyes    3. Mature cataract  H26.8     4. Posterior synechiae of right eye  H21.541     5. Pseudophakia  Z96.1     6. Legal blindness  H54.8       1,2.  Proliferative diabetic retinopathy w/ TRD OU (OD > OS)  - pt with extensive history of retinal care at [facility] with Dr. Jacklynn Ganong  - s/p PPV w/ membrane peel and silicon oil x2 OS (03/7828, and 12/2016)  - pt presents today for yearly check   - pt reports no acute changes in vision and reports eyes have been stable and pain free since last visit   - BCVA NLP OD, LP OS -- stable  - OD no view of posterior pole due to 360 PS and mature white cataract, OCT OD shows fibrotic chronic TRD  - OS with severe ischemia and fibrosis under silicon oil -- limited visual potential, stable  - no intervention indicated or recommended at this time  - [facility] with [facility] as of 2021 visit  - f/u 1 year, sooner prn  3,4. Mature cataract w/ 360 posterior synechiae OD  - low visual potential  - recommend monitoring  5. Pseudophakia OS  - s/p CE/IOL in 2017 with PPV  - PCIOL in good position  - monitor  6. Legal Blindness  - secondary to severe PDR with TRDs OU and mature cataract OD  - established with [facility]   Ophthalmic Meds Ordered this visit:  No orders of the defined types were placed in this encounter.      Return in about 1 year (around 08/15/2022) for f/u PDR OU, DFE, OCT.  There are no Patient Instructions on file for this visit.   Explained the diagnoses, plan, and follow up with the patient and they expressed understanding.  Patient expressed understanding of the importance of proper follow up care.   This document serves  as a record of services personally performed by Gardiner Sleeper, MD, PhD. It was created on their behalf by San Jetty. Owens Shark, OA an ophthalmic technician. The creation of this record is the provider's dictation and/or activities during the visit.    Electronically signed by: San Jetty. Owens Shark, [place] 09.14.2022 2:41 AM   Gardiner Sleeper, M.D., Ph.D. Diseases & Surgery of the Retina and Vitreous [facility]  I have reviewed the above documentation for accuracy and completeness, and I agree with the above. Gardiner Sleeper, M.D., Ph.D. 08/18/21 2:43 AM   Abbreviations: M myopia (nearsighted); A astigmatism; H hyperopia (farsighted); P presbyopia; Mrx spectacle prescription;  CTL contact lenses; OD right eye; OS left eye; OU both eyes  XT exotropia; ET esotropia; PEK punctate epithelial keratitis; PEE punctate epithelial erosions; DES dry eye syndrome; MGD meibomian gland dysfunction; ATs artificial tears; PFAT's preservative free artificial tears; [place] nuclear sclerotic cataract; PSC posterior subcapsular cataract; ERM epi-retinal membrane; PVD posterior vitreous detachment; RD retinal detachment; DM diabetes mellitus; DR diabetic retinopathy; NPDR non-proliferative diabetic retinopathy; PDR proliferative diabetic retinopathy; CSME clinically significant macular edema; DME diabetic macular edema; dbh dot blot hemorrhages; CWS cotton wool spot; POAG primary open angle glaucoma; C/D cup-to-disc ratio; HVF humphrey visual field; GVF goldmann visual field; OCT optical coherence tomography; IOP intraocular pressure; BRVO Branch retinal vein occlusion; CRVO central retinal vein occlusion; CRAO central retinal artery occlusion; BRAO branch retinal artery occlusion; RT retinal tear; SB scleral buckle; PPV pars plana vitrectomy; VH Vitreous hemorrhage; PRP panretinal laser photocoagulation; IVK intravitreal kenalog; VMT vitreomacular traction; MH Macular hole;  NVD neovascularization of the disc; NVE neovascularization elsewhere; AREDS age related eye disease study; ARMD age related macular degeneration; POAG primary open angle glaucoma; EBMD epithelial/anterior basement membrane dystrophy; ACIOL anterior chamber intraocular lens; IOL intraocular lens; PCIOL posterior chamber intraocular lens; Phaco/IOL phacoemulsification with intraocular lens placement; [place] photorefractive keratectomy; LASIK laser assisted in situ  keratomileusis; HTN hypertension; DM diabetes mellitus; COPD chronic obstructive pulmonary disease

## 2021-08-15 ENCOUNTER — Other Ambulatory Visit: Payer: Self-pay

## 2021-08-15 ENCOUNTER — Encounter (INDEPENDENT_AMBULATORY_CARE_PROVIDER_SITE_OTHER): Payer: Self-pay | Admitting: Ophthalmology

## 2021-08-15 ENCOUNTER — Ambulatory Visit (INDEPENDENT_AMBULATORY_CARE_PROVIDER_SITE_OTHER): Payer: Medicare HMO | Admitting: Ophthalmology

## 2021-08-15 ENCOUNTER — Encounter (INDEPENDENT_AMBULATORY_CARE_PROVIDER_SITE_OTHER): Payer: Medicare PPO | Admitting: Ophthalmology

## 2021-08-15 DIAGNOSIS — H268 Other specified cataract: Secondary | ICD-10-CM | POA: Diagnosis not present

## 2021-08-15 DIAGNOSIS — H21541 Posterior synechiae (iris), right eye: Secondary | ICD-10-CM

## 2021-08-15 DIAGNOSIS — Z961 Presence of intraocular lens: Secondary | ICD-10-CM

## 2021-08-15 DIAGNOSIS — E113523 Type 2 diabetes mellitus with proliferative diabetic retinopathy with traction retinal detachment involving the macula, bilateral: Secondary | ICD-10-CM | POA: Diagnosis not present

## 2021-08-15 DIAGNOSIS — H548 Legal blindness, as defined in USA: Secondary | ICD-10-CM

## 2021-08-15 DIAGNOSIS — H3581 Retinal edema: Secondary | ICD-10-CM

## 2021-08-18 ENCOUNTER — Encounter (INDEPENDENT_AMBULATORY_CARE_PROVIDER_SITE_OTHER): Payer: Self-pay | Admitting: Ophthalmology

## 2021-08-19 ENCOUNTER — Other Ambulatory Visit: Payer: Self-pay | Admitting: Family Medicine

## 2021-08-26 ENCOUNTER — Other Ambulatory Visit: Payer: Self-pay | Admitting: Family Medicine

## 2021-08-26 DIAGNOSIS — I10 Essential (primary) hypertension: Secondary | ICD-10-CM

## 2021-08-27 IMAGING — MG DIGITAL SCREENING BILAT W/ CAD
5 series · 5 of 5 positions shown · non-contrast
Comparison: Previous exam(s).

CLINICAL DATA: Screening.

EXAM:
DIGITAL SCREENING BILATERAL MAMMOGRAM WITH CAD

[L CV]
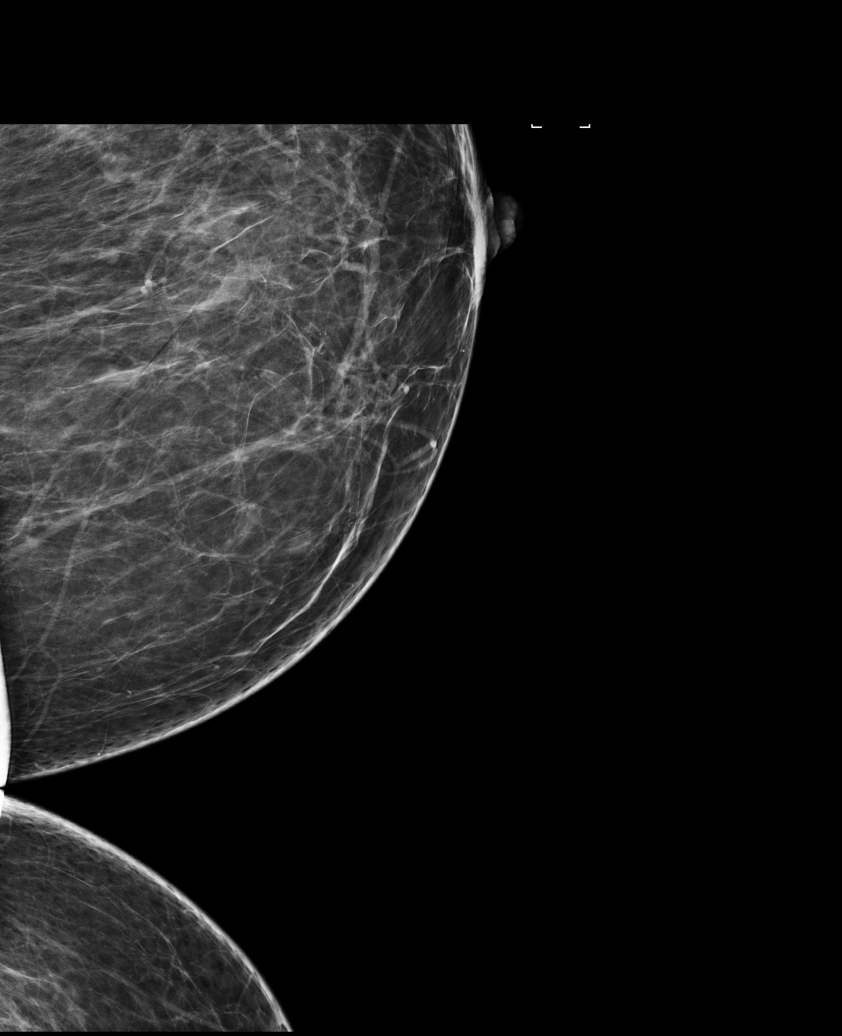

[R MLO]
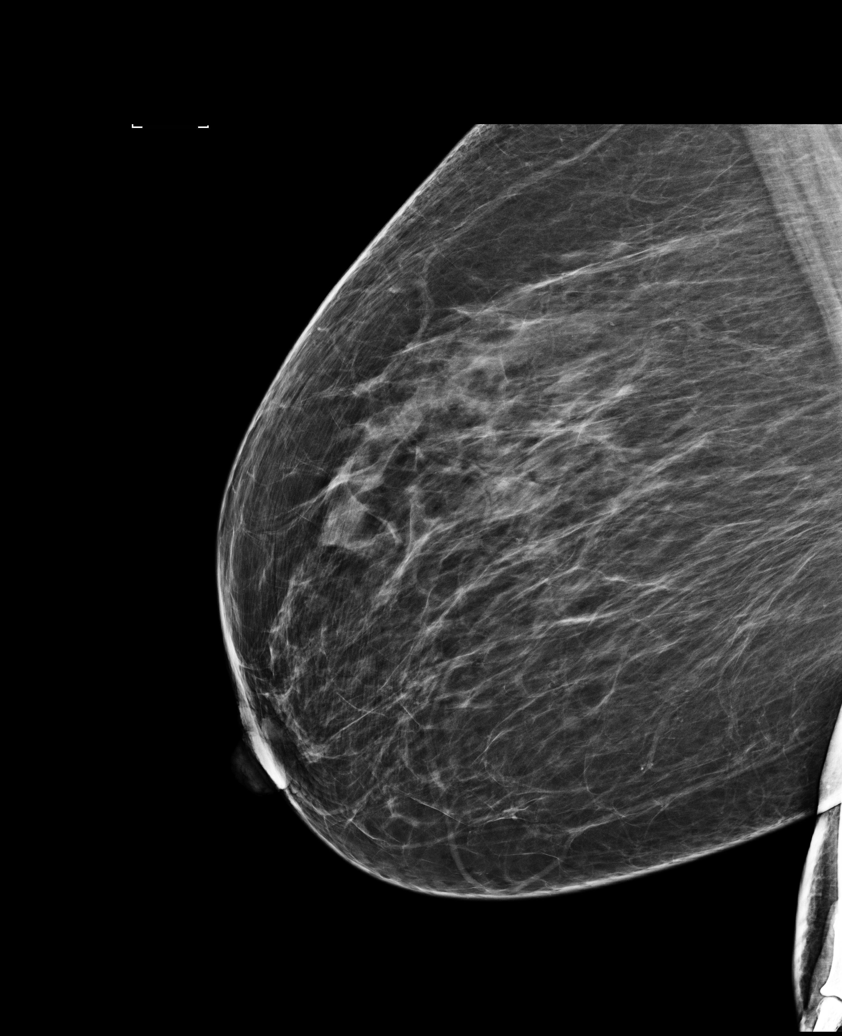

[L CC]
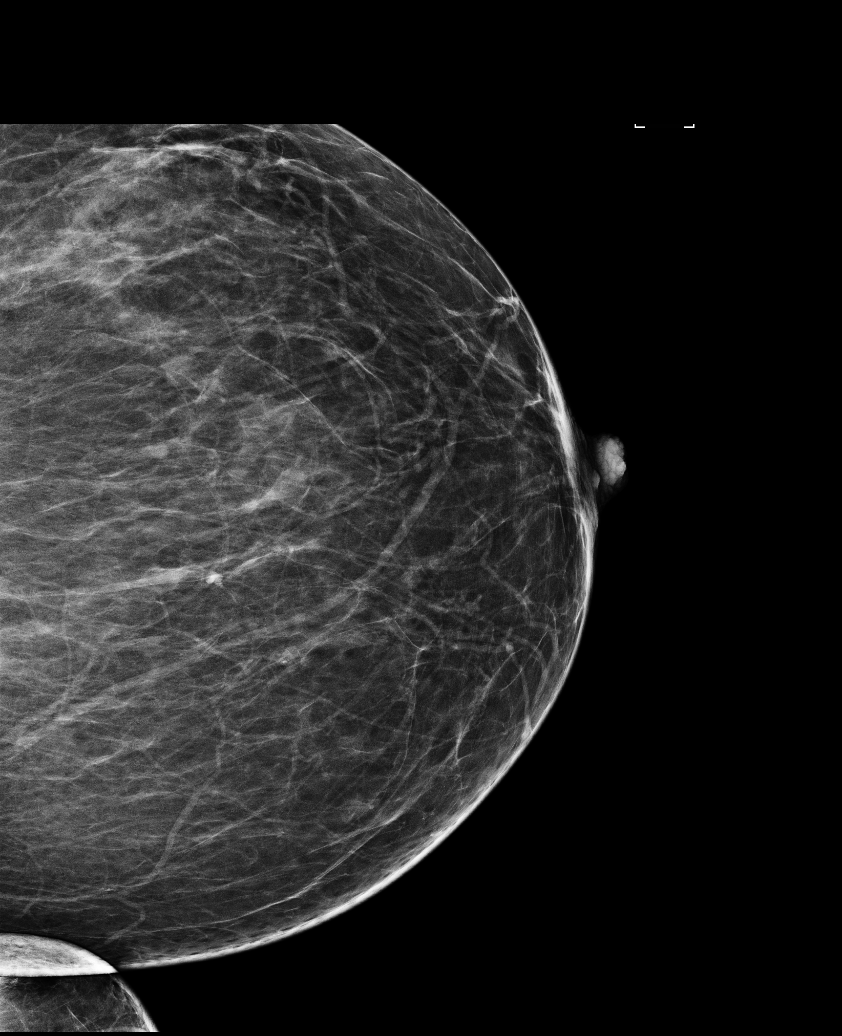

[R CC]
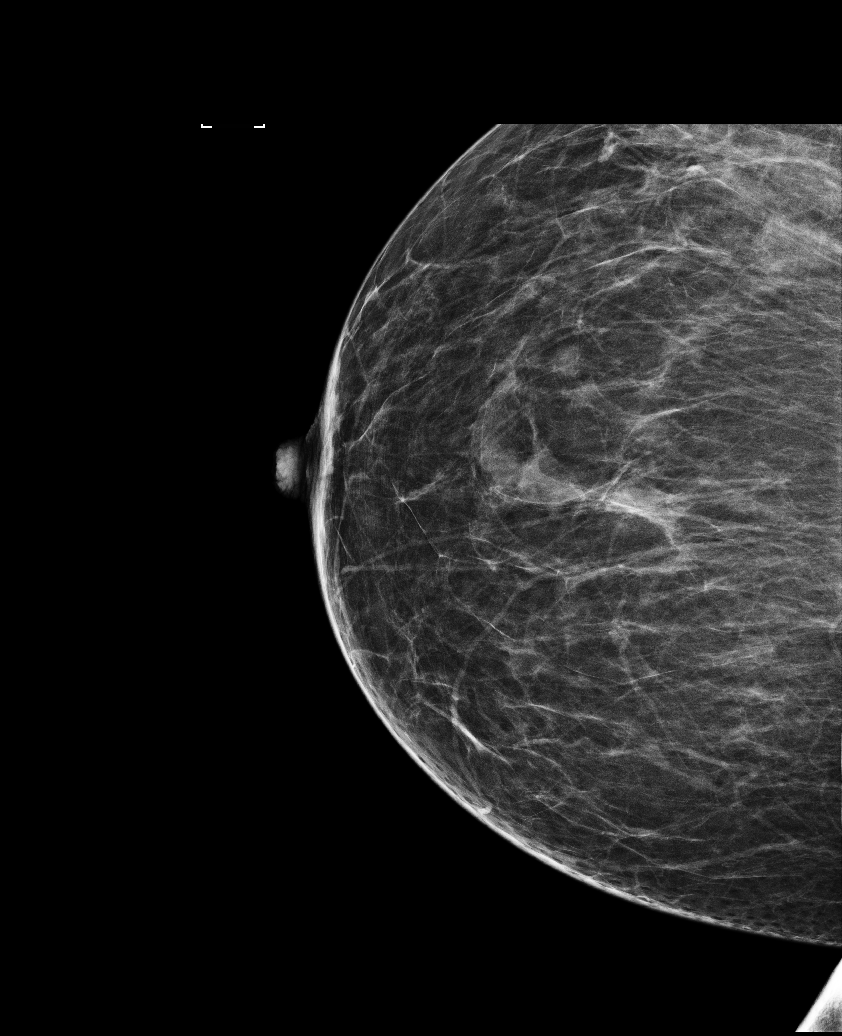

[L MLO]
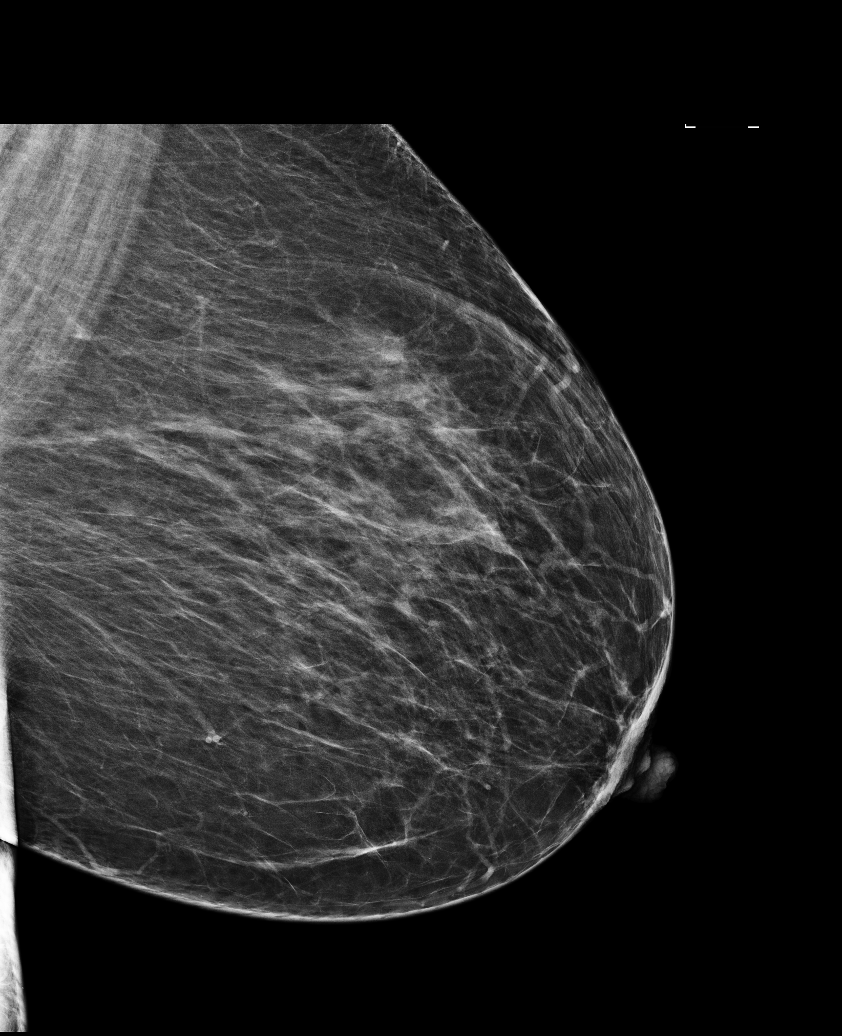

[5 of 5 positions shown; findings below may reference images not displayed]

ACR Breast Density Category b: There are scattered areas of
fibroglandular density.
FINDINGS: In the right breast a possible asymmetry in the outer mid breast in
the craniocaudal projection requires further evaluation.

In the left breast a possible asymmetry in the posterior central
breast slightly laterally in the craniocaudal projection requires
further evaluation.

Images were processed with CAD.
IMPRESSION: Further evaluation is suggested for possible asymmetry in the right
breast.

Further evaluation is suggested for possible asymmetry in the left
breast.

RECOMMENDATION:
Diagnostic mammogram and possibly ultrasound of both breasts.
(Code:66-1-QQE)

The patient will be contacted regarding the findings, and additional
imaging will be scheduled.

BI-RADS CATEGORY  0: Incomplete. Need additional imaging evaluation
and/or prior mammograms for comparison.

## 2022-01-07 ENCOUNTER — Other Ambulatory Visit: Payer: Self-pay

## 2022-01-07 DIAGNOSIS — N189 Chronic kidney disease, unspecified: Secondary | ICD-10-CM

## 2022-01-10 ENCOUNTER — Ambulatory Visit (HOSPITAL_COMMUNITY)
Admission: RE | Admit: 2022-01-10 | Discharge: 2022-01-10 | Disposition: A | Discharge status: Home / Self Care | Payer: Medicare HMO | Source: Ambulatory Visit | Attending: Vascular Surgery | Admitting: Vascular Surgery

## 2022-01-10 ENCOUNTER — Ambulatory Visit (INDEPENDENT_AMBULATORY_CARE_PROVIDER_SITE_OTHER)
Admission: RE | Admit: 2022-01-10 | Discharge: 2022-01-10 | Disposition: A | Discharge status: Home / Self Care | Payer: Medicare HMO | Source: Ambulatory Visit | Attending: Vascular Surgery | Admitting: Vascular Surgery

## 2022-01-10 ENCOUNTER — Ambulatory Visit: Payer: Medicare HMO | Admitting: Vascular Surgery

## 2022-01-10 ENCOUNTER — Encounter: Payer: Self-pay | Admitting: Vascular Surgery

## 2022-01-10 ENCOUNTER — Other Ambulatory Visit: Payer: Self-pay

## 2022-01-10 VITALS — BP 148/84 | HR 74 | Temp 97.2°F | Resp 14 | Ht 65.0 in | Wt 234.0 lb

## 2022-01-10 DIAGNOSIS — N189 Chronic kidney disease, unspecified: Secondary | ICD-10-CM | POA: Diagnosis present

## 2022-01-10 DIAGNOSIS — N184 Chronic kidney disease, stage 4 (severe): Secondary | ICD-10-CM

## 2022-01-10 NOTE — Progress Notes (Signed)
Office Note     CC:  ESRD Requesting Provider:  Rexene Agent, MD  HPI: Sarah Kline is a Right handed 56 y.o. ([birth year]) female with kidney disease who presents at the request of Rexene Agent, MD for permanent HD access. The patient has had no prior access procedures. No previous tunneled lines.  On exam, Sarah Kline was doing well.  She was accompanied by her husband.  Both graduates of [employer], they met years later.  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  She has worked with services for the blind from to improve her daily activity.  She had several questions regarding dialysis access as well as dialysis  The pt is  on a statin for cholesterol management.  The pt is  on a daily aspirin.   Other AC:  - The pt is  on medications for hypertension.   The pt is  diabetic. Tobacco hx:  -  Past Medical History:  Diagnosis Date   Blind    Diabetes mellitus    Hypertension    Sickle cell disease, type S ([place])     Past Surgical History:  Procedure Laterality Date   EYE SURGERY Left    retinal detachment repair 2017   EYE SURGERY Left    ? type of surgery to repair blood vessels 2018   GANGLION CYST EXCISION     Left dorsal wrist   RETINAL DETACHMENT SURGERY Left    2017, [place]    Social History   Socioeconomic History   Marital status: Married    Spouse name: Not on file   Number of children: Not on file   Years of education: Not on file   Highest education level: Not on file  Occupational History   Not on file  Tobacco Use   Smoking status: Never   Smokeless tobacco: Never  Substance and Sexual Activity   Alcohol use: Yes    Alcohol/week: 2.0 standard drinks    Types: 2 Shots of liquor per week    Comment: twice per month   Drug use: No    Comment: Very remote marijuana use, 20 years ago   Sexual activity: Not on file  Other Topics Concern   Not on file  Social History Narrative   Not on file    Social Determinants of Health   Financial Resource Strain: Not on file  Food Insecurity: Not on file  Transportation Needs: Not on file  Physical Activity: Not on file  Stress: Not on file  Social Connections: Not on file  Intimate Partner Violence: Not on file    Family History  Problem Relation Age of Onset   Asthma Mother    COPD Mother    Hypertension Mother    Stroke Mother    Diabetes Daughter    Diabetes Son     Current Outpatient Medications  Medication Sig Dispense Refill   amLODipine (NORVASC) 10 MG tablet TAKE 1 TABLET EVERY EVENING 90 tablet 3   ASPIRIN LOW DOSE 81 MG EC tablet TAKE 1 TABLET BY MOUTH EVERY DAY 90 tablet 3   atorvastatin (LIPITOR) 80 MG tablet TAKE 1 TABLET BY MOUTH DAILY AT 6 PM 90 tablet 3   Blood Glucose Monitoring Suppl (PRODIGY AUTOCODE BLOOD GLUCOSE) w/Device KIT 1 kit by Does not apply route as directed.     calcitRIOL (ROCALTROL) 0.25 MCG capsule TAKE 1 CAPSULE EVERY DAY 90 capsule 3   calcium-vitamin D (OSCAL  WITH D) 500-200 MG-UNIT tablet Take 1 tablet by mouth.     carvedilol (COREG) 3.125 MG tablet TAKE 1 TABLET TWICE DAILY WITH FOOD 180 tablet 3   cloNIDine (CATAPRES) 0.1 MG tablet TAKE 1 TABLET TWICE DAILY. 180 tablet 1   Dulaglutide (TRULICITY) 1.5 XU/3.8BF SOPN Inject 1.5 mg into the skin once a week. 12 pen 0   hydrALAZINE (APRESOLINE) 10 MG tablet Take 1 tablet (10 mg total) by mouth 3 (three) times daily. 270 tablet 3   ketoconazole (NIZORAL) 2 % cream Apply 1 application topically daily. 30 g 1   losartan (COZAAR) 50 MG tablet Take 50 mg by mouth daily.     sodium bicarbonate 650 MG tablet Take 1,300 mg by mouth 2 (two) times daily.  6   No current facility-administered medications for this visit.    Allergies  Allergen Reactions   Bactrim [Sulfamethoxazole-Trimethoprim] Rash     REVIEW OF SYSTEMS:   _0  denotes positive finding, _1  denotes negative finding Cardiac  Comments:  Chest pain or chest pressure:     Shortness of breath upon exertion:    Short of breath when lying flat:    Irregular heart rhythm:        Vascular    Pain in calf, thigh, or hip brought on by ambulation:    Pain in feet at night that wakes you up from your sleep:     Blood clot in your veins:    Leg swelling:         Pulmonary    Oxygen at home:    Productive cough:     Wheezing:         Neurologic    Sudden weakness in arms or legs:     Sudden numbness in arms or legs:     Sudden onset of difficulty speaking or slurred speech:    Temporary loss of vision in one eye:     Problems with dizziness:         Gastrointestinal    Blood in stool:     Vomited blood:         Genitourinary    Burning when urinating:     Blood in urine:        Psychiatric    Major depression:         Hematologic    Bleeding problems:    Problems with blood clotting too easily:        Skin    Rashes or ulcers:        Constitutional    Fever or chills:      PHYSICAL EXAMINATION:  Vitals:   01/10/22 1516  BP: (!) 148/84  Pulse: 74  Resp: 14  Temp: (!) 97.2 F (36.2 C)  TempSrc: Temporal  SpO2: 95%  Weight: 234 lb (106.1 kg)  Height: _2  (1.651 m)    General:  WDWN in NAD; vital signs documented above Gait: Not observed HENT: WNL, normocephalic Pulmonary: normal non-labored breathing , without Rales, rhonchi,  wheezing Cardiac: regular HR,  Abdomen: soft, NT, no masses Skin: without rashes Vascular Exam/Pulses:  Right Left  Radial 2+ (normal) 2+ (normal)  Ulnar 2+ (normal) 2+ (normal)  Femoral    Popliteal    DP 2+ (normal) 2+ (normal)  PT 2+ (normal) 2+ (normal)   Extremities: without ischemic changes, without Gangrene , without cellulitis; without open wounds;  Musculoskeletal: no muscle wasting or atrophy  Neurologic: A&O X 3;  No focal weakness or  paresthesias are detected Psychiatric:  The pt has Normal affect.   Non-Invasive Vascular Imaging:   Sufficient intraluminal diameter of brachial  arteries bilaterally Bilateral superficial femoral artery veins small    ASSESSMENT/PLAN:  Sarah Kline is a 56 y.o. female who presents with chronic kidney disease stage 4  Based on vein mapping and examination, Sarah Kline has veins that are too small for arteriovenous fistula creation.  Fortunately, she remains chronic kidney disease stage IV, and has time.  We discussed how exercise, specifically arm exercises, can improve venous luminal size. Her and her husband would like to try to increase the size of her vein for native fistula creation rather than graft. I told her I would be happy to see her in 1 month to assess for progression.Sarah John, MD [facility] [phone]

## 2022-01-17 ENCOUNTER — Other Ambulatory Visit: Payer: Self-pay | Admitting: *Deleted

## 2022-01-17 DIAGNOSIS — N184 Chronic kidney disease, stage 4 (severe): Secondary | ICD-10-CM

## 2022-02-13 NOTE — Progress Notes (Signed)
?Office Note  ? ? ? ?CC:  ESRD ?Requesting Provider:  Cipriano Mile, NP ? ?HPI: Sarah Kline is a Right handed 56 y.o. ([birth year]) female with kidney disease who presents in follow up for access. At her last visit, bilateral upper extremity veins were too small for AV fistula craetion. The pt did not want to pursue graft, and asked to come back on one month after completing arm exercises to see if her veins would grow. at the request of Cipriano Mile, NP for permanent HD access. The patient has had no prior access procedures. No previous tunneled lines. ? ?On exam, Sarah Kline was doing well.  She was accompanied by her husband.  Both graduates of [employer], they met years later.  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  She has worked with services for the blind from to improve her daily activity.  She had several questions regarding dialysis access as well as dialysis ? ?The pt is  on a statin for cholesterol management.  ?The pt is  on a daily aspirin.   Other AC:  - ?The pt is  on medications for hypertension.   ?The pt is  diabetic. ?Tobacco hx:  - ? ?Past Medical History:  ?Diagnosis Date  ? Blind   ? Diabetes mellitus   ? Hypertension   ? Sickle cell disease, type S ([place])   ? ? ?Past Surgical History:  ?Procedure Laterality Date  ? EYE SURGERY Left   ? retinal detachment repair 2017  ? EYE SURGERY Left   ? ? type of surgery to repair blood vessels 2018  ? GANGLION CYST EXCISION    ? Left dorsal wrist  ? RETINAL DETACHMENT SURGERY Left   ? 2017, [place]  ? ? ?Social History  ? ?Socioeconomic History  ? Marital status: Married  ?  Spouse name: Not on file  ? Number of children: Not on file  ? Years of education: Not on file  ? Highest education level: Not on file  ?Occupational History  ? Not on file  ?Tobacco Use  ? Smoking status: Never  ? Smokeless tobacco: Never  ?Substance and Sexual Activity  ? Alcohol use: Yes  ?  Alcohol/week: 2.0 standard drinks  ?   Types: 2 Shots of liquor per week  ?  Comment: twice per month  ? Drug use: No  ?  Comment: Very remote marijuana use, 20 years ago  ? Sexual activity: Not on file  ?Other Topics Concern  ? Not on file  ?Social History Narrative  ? Not on file  ? ?Social Determinants of Health  ? ?Financial Resource Strain: Not on file  ?Food Insecurity: Not on file  ?Transportation Needs: Not on file  ?Physical Activity: Not on file  ?Stress: Not on file  ?Social Connections: Not on file  ?Intimate Partner Violence: Not on file  ? ? ?Family History  ?Problem Relation Age of Onset  ? Asthma Mother   ? COPD Mother   ? Hypertension Mother   ? Stroke Mother   ? Diabetes Daughter   ? Diabetes Son   ? ? ?Current Outpatient Medications  ?Medication Sig Dispense Refill  ? amLODipine (NORVASC) 10 MG tablet TAKE 1 TABLET EVERY EVENING 90 tablet 3  ? ASPIRIN LOW DOSE 81 MG EC tablet TAKE 1 TABLET BY MOUTH EVERY DAY 90 tablet 3  ? atorvastatin (LIPITOR) 80 MG tablet TAKE 1 TABLET BY MOUTH DAILY AT 6 PM 90  tablet 3  ? Blood Glucose Monitoring Suppl (PRODIGY AUTOCODE BLOOD GLUCOSE) w/Device KIT 1 kit by Does not apply route as directed.    ? calcitRIOL (ROCALTROL) 0.25 MCG capsule TAKE 1 CAPSULE EVERY DAY 90 capsule 3  ? calcium-vitamin D (OSCAL WITH D) 500-200 MG-UNIT tablet Take 1 tablet by mouth.    ? carvedilol (COREG) 3.125 MG tablet TAKE 1 TABLET TWICE DAILY WITH FOOD 180 tablet 3  ? cloNIDine (CATAPRES) 0.1 MG tablet TAKE 1 TABLET TWICE DAILY. 180 tablet 1  ? Dulaglutide (TRULICITY) 1.5 XN/2.3FT SOPN Inject 1.5 mg into the skin once a week. 12 pen 0  ? hydrALAZINE (APRESOLINE) 10 MG tablet Take 1 tablet (10 mg total) by mouth 3 (three) times daily. 270 tablet 3  ? ketoconazole (NIZORAL) 2 % cream Apply 1 application topically daily. 30 g 1  ? losartan (COZAAR) 50 MG tablet Take 50 mg by mouth daily.    ? sodium bicarbonate 650 MG tablet Take 1,300 mg by mouth 2 (two) times daily.  6  ? ?No current facility-administered medications for  this visit.  ? ? ?Allergies  ?Allergen Reactions  ? Bactrim [Sulfamethoxazole-Trimethoprim] Rash  ? ? ? ?REVIEW OF SYSTEMS:  ? ?'[X]'  denotes positive finding, '[ ]'  denotes negative finding ?Cardiac  Comments:  ?Chest pain or chest pressure:    ?Shortness of breath upon exertion:    ?Short of breath when lying flat:    ?Irregular heart rhythm:    ?    ?Vascular    ?Pain in calf, thigh, or hip brought on by ambulation:    ?Pain in feet at night that wakes you up from your sleep:     ?Blood clot in your veins:    ?Leg swelling:     ?    ?Pulmonary    ?Oxygen at home:    ?Productive cough:     ?Wheezing:     ?    ?Neurologic    ?Sudden weakness in arms or legs:     ?Sudden numbness in arms or legs:     ?Sudden onset of difficulty speaking or slurred speech:    ?Temporary loss of vision in one eye:     ?Problems with dizziness:     ?    ?Gastrointestinal    ?Blood in stool:     ?Vomited blood:     ?    ?Genitourinary    ?Burning when urinating:     ?Blood in urine:    ?    ?Psychiatric    ?Major depression:     ?    ?Hematologic    ?Bleeding problems:    ?Problems with blood clotting too easily:    ?    ?Skin    ?Rashes or ulcers:    ?    ?Constitutional    ?Fever or chills:    ? ? ?PHYSICAL EXAMINATION: ? ?There were no vitals filed for this visit. ? ? ?General:  WDWN in NAD; vital signs documented above ?Gait: Not observed ?HENT: WNL, normocephalic ?Pulmonary: normal non-labored breathing , without Rales, rhonchi,  wheezing ?Cardiac: regular HR,  ?Abdomen: soft, NT, no masses ?Skin: without rashes ?Vascular Exam/Pulses: ? Right Left  ?Radial 2+ (normal) 2+ (normal)  ?Ulnar 2+ (normal) 2+ (normal)  ?Femoral    ?Popliteal    ?DP 2+ (normal) 2+ (normal)  ?PT 2+ (normal) 2+ (normal)  ? ?Extremities: without ischemic changes, without Gangrene , without cellulitis; without open wounds;  ?Musculoskeletal: no muscle wasting  or atrophy  ?Neurologic: A&O X 3;  No focal weakness or paresthesias are detected ?Psychiatric:  The pt  has Normal affect. ? ? ?Non-Invasive Vascular Imaging:   ?Sufficient intraluminal diameter of brachial arteries bilaterally ?Bilateral superficial femoral artery veins small ? ? ? ?ASSESSMENT/PLAN: ? ?Sarah Kline is a 56 y.o. female who presents with chronic kidney disease stage 4 ? ?Based on vein mapping and examination, Sarah Kline's superficial veins have dilated nicely.  She has adequate right-sided cephalic, right-sided basilic and left-sided basilic veins for access creation.  We discussed right-sided brachiocephalic fistula creation as it may not require superficialization prior to use.  ?After discussing the risk and benefits of right-sided brachiocephalic fistula creation, Tonni elected to proceed. ? ? ?Broadus John, MD ?[facility] ?[phone] ? ?

## 2022-02-14 ENCOUNTER — Ambulatory Visit: Payer: Medicare HMO | Admitting: Vascular Surgery

## 2022-02-14 ENCOUNTER — Other Ambulatory Visit: Payer: Self-pay

## 2022-02-14 ENCOUNTER — Encounter: Payer: Self-pay | Admitting: Vascular Surgery

## 2022-02-14 ENCOUNTER — Ambulatory Visit (HOSPITAL_COMMUNITY)
Admission: RE | Admit: 2022-02-14 | Discharge: 2022-02-14 | Disposition: A | Discharge status: Home / Self Care | Payer: Medicare HMO | Source: Ambulatory Visit | Attending: Vascular Surgery | Admitting: Vascular Surgery

## 2022-02-14 VITALS — BP 123/61 | HR 71 | Temp 98.0°F | Resp 20 | Ht 65.0 in | Wt 234.0 lb

## 2022-02-14 DIAGNOSIS — N184 Chronic kidney disease, stage 4 (severe): Secondary | ICD-10-CM

## 2022-02-17 ENCOUNTER — Other Ambulatory Visit: Payer: Self-pay

## 2022-03-03 ENCOUNTER — Encounter (HOSPITAL_COMMUNITY): Payer: Self-pay | Admitting: Vascular Surgery

## 2022-03-03 ENCOUNTER — Other Ambulatory Visit: Payer: Self-pay

## 2022-03-03 NOTE — Anesthesia Preprocedure Evaluation (Addendum)
Anesthesia Evaluation  ?Patient identified by MRN, date of birth, ID band ?Patient awake ? ? ? ?Reviewed: ?Allergy & Precautions, NPO status , Patient's Chart, lab work & pertinent test results ? ?Airway ?Mallampati: III ? ?TM Distance: >3 FB ?Neck ROM: Full ? ? ? Dental ? ?(+) Missing, Dental Advisory Given ?  ?Pulmonary ?neg pulmonary ROS,  ?  ?Pulmonary exam normal ?breath sounds clear to auscultation ? ? ? ? ? ? Cardiovascular ?hypertension, Pt. on medications ?Normal cardiovascular exam ?Rhythm:Regular Rate:Normal ? ?Echo 2018 ?- Normal LV size with moderate LV hypertrophy. EF 60-65%. Normal RV  ???size and systolic function. No significant valvular  ???abnormalities.  ?  ?Neuro/Psych ?negative neurological ROS ? negative psych ROS  ? GI/Hepatic ?negative GI ROS, Neg liver ROS,   ?Endo/Other  ?diabetes, Well Controlled, Type 2, Oral Hypoglycemic AgentsObesity BMI 39 ? Renal/GU ?ESRFRenal disease  ?negative genitourinary ?  ?Musculoskeletal ?negative musculoskeletal ROS ?(+)  ? Abdominal ?(+) + obese,   ?Peds ? Hematology ? ?(+) Blood dyscrasia, Sickle cell anemia and anemia , Hb 11.9   ?Anesthesia Other Findings ? ? Reproductive/Obstetrics ?negative OB ROS ? ?  ? ? ? ? ? ? ? ? ? ? ? ? ? ?  ?  ? ? ? ? ? ? ?Anesthesia Physical ?Anesthesia Plan ? ?ASA: 3 ? ?Anesthesia Plan: MAC and Regional  ? ?Post-op Pain Management: Regional block*  ? ?Induction:  ? ?PONV Risk Score and Plan: 2 and Propofol infusion and TIVA ? ?Airway Management Planned: Natural Airway and Simple Face Mask ? ?Additional Equipment: None ? ?Intra-op Plan:  ? ?Post-operative Plan:  ? ?Informed Consent: I have reviewed the patients History and Physical, chart, labs and discussed the procedure including the risks, benefits and alternatives for the proposed anesthesia with the patient or authorized representative who has indicated his/her understanding and acceptance.  ? ? ? ? ? ?Plan Discussed with:  CRNA ? ?Anesthesia Plan Comments:   ? ? ? ? ?Anesthesia Quick Evaluation ? ?

## 2022-03-03 NOTE — Pre-Procedure Instructions (Signed)
?[facility] - [place], [place] - [address] ?[address] ?[place] [address] ?Phone: [phone] Fax: [phone] ? ?[place] ?[place] ?[place] [address] ?Phone: [phone] Fax: [phone] ? ?[place] ?[place] ?[place] [address] ?Phone: [phone] Fax: [phone] ? ? ?PCP - Cipriano Mile, NP ?Cardiologist - Carly Wonda Cheng, MD ? ?Aspirin Instructions: Take DOS ? ?ERAS Protcol - NPO ? ?Anesthesia review: Y ? ?Patient verbally denies any shortness of breath, fever, cough and chest pain during phone call ? ? ?-------------  SDW INSTRUCTIONS given: ? ?Your procedure is scheduled on 03/04/22. ? Report to [facility] Main Entrance "A" at 0530 A.M., and check in at the Admitting office. ? Call this number if you have problems the morning of surgery: ? [phone] ? ? Remember: ? Do not eat or drink after midnight the night before your surgery ? ?  ? Take these medicines the morning of surgery with A SIP OF WATER  ?carvedilol (COREG) ?amLODipine (NORVASC)  ?ASPIRIN  ?acetaminophen (TYLENOL)-if needed ? ?As of today, STOP taking any Aspirin (unless otherwise instructed by your surgeon) Aleve, Naproxen, Ibuprofen, Motrin, Advil, Goody's, BC's, all herbal medications, fish oil, and all vitamins. ? ?         ?           Do not wear jewelry, make up, or nail polish ?           Do not wear lotions, powders, perfumes/colognes, or deodorant. ?           Do not shave 48 hours prior to surgery.  Men may shave face and neck. ?           Do not bring valuables to the hospital. ?           [facility] is not responsible for any belongings or valuables. ? ?Do NOT Smoke (Tobacco/Vaping) 24 hours prior to your procedure ?If you use a CPAP at night, you may bring all equipment for your overnight stay. ?  ?Contacts, glasses, dentures or bridgework may not be worn into surgery.    ?  ?For patients admitted to the hospital,  discharge time will be determined by your treatment team. ?  ?Patients discharged the day of surgery will not be allowed to drive home, and someone needs to stay with them for 24 hours. ? ? ? ?Special instructions:   ?[facility]- Preparing For Surgery ? ?Before surgery, you can play an important role. Because skin is not sterile, your skin needs to be as free of germs as possible. You can reduce the number of germs on your skin by washing with CHG (chlorahexidine gluconate) Soap before surgery.  CHG is an antiseptic cleaner which kills germs and bonds with the skin to continue killing germs even after washing.   ? ?Oral Hygiene is also important to reduce your risk of infection.  Remember - BRUSH YOUR TEETH THE MORNING OF SURGERY WITH YOUR REGULAR TOOTHPASTE ? ?Please do not use if you have an allergy to CHG or antibacterial soaps. If your skin becomes reddened/irritated stop using the CHG.  ?Do not shave (including legs and underarms) for at least 48 hours prior to first CHG shower. It is OK to shave your face. ? ?Please follow these instructions carefully. ?  ?Shower the NIGHT BEFORE SURGERY and the MORNING OF SURGERY with DIAL Soap.  ? ?Pat yourself dry with  a CLEAN TOWEL. ? ?Wear CLEAN PAJAMAS to bed the night before surgery ? ?Place CLEAN SHEETS on your bed the night of your first shower and DO NOT SLEEP WITH PETS. ? ? ?Day of Surgery: ?Please shower morning of surgery  ?Wear Clean/Comfortable clothing the morning of surgery ?Do not apply any deodorants/lotions.   ?Remember to brush your teeth WITH YOUR REGULAR TOOTHPASTE. ?  ?Questions were answered. Patient verbalized understanding of instructions.  ? ? ?   ? ?

## 2022-03-03 NOTE — Progress Notes (Signed)
Anesthesia Chart Review: ? ?Pt is a same day work up ? ? Case: [id number] Date/Time: 03/04/22 0715  ? Procedure: RIGHT BRACHIOCEPHALIC FISTULA CREATION (Right) - PERIPHERAL NERVE BLOCK  ? Anesthesia type: Monitor Anesthesia Care  ? Pre-op diagnosis: CKD IV  ? Location: [facility] OR ROOM 09 / [facility]  ? Surgeons: Broadus John, MD  ? ?  ? ? ?DISCUSSION: ?Pt is 56 years old with hx HTN, DM, sickle cell disease, anemia, (CKD stage 4/5, not yet on dialysis; also undergoing eval for transplant at [facility]).  ? ?Pt is blind.  ? ?Underwent cardiac testing as part of transplant workup. Stress test 02/04/22 showed " Small fixed perfusion defect involving the distal-mid lateral wall segments, with small amount of peri-infarct ischemia." Pt has been referred to cardiology but this visit is not scheduled until 04/24/22.  ? ?Reviewed stress test results with Dr. Roanna Banning.  ? ? ?VS: Ht _0  (1.626 m)   Wt 108.9 kg   LMP 04/28/2018   BMI 41.20 kg/m?  ? ?PROVIDERS: ?- PCP is Cipriano Mile, NP ?- Nephrologist is Pearson Grippe, MD ? ? ?LABS: Will be obtained day of surgery  ? ? ?IMAGES: ?CXR 06/17/21 (care everywhere): ?- Mild cardiomegaly. No overt pulmonary edema. ? ?CT abd pelvis 06/17/21 (care everywhere): ?- Cortical scarring consistent with renal failure. Atherosclerotic consultations identified. Fibroids within uterus ? ?EKG: Will be obtained day of surgery if needed ?- EKG report in care everywhere 02/04/22 documents "sinus rhythm" ? ? ?CV: ?PET cardiac rubidium stress test 02/04/22 (care everywhere): ?1.  Small fixed perfusion defect involving the distal-mid lateral wall segments, with small amount of peri-infarct ischemia.  ?2.  Normal left ventricular ejection fraction.  ?3.  Total coronary flow reserve: 1.57. Globally diminished coronary flow reserve, likely represents significant microvascular disease.  ?4.  Ancillary CT findings: Trace pericardial effusion. No acute abnormality on this low-dose CT. ? ?Echo 02/04/22 (care everywhere):   ?- The left ventricular size is normal. Mild left ventricular hypertrophy. The left ventricle is hyperdynamic. LV ejection fraction = 65-70%.  ?- LV hypertrophy noted of all segments ( 1.2cm) with superimposed asymmetric nature (septum is 1.6cm). Mild systolic anterior motion of the mitral valve is noted.  ?- There is no comparison study available.  ?- There is no significant valvular stenosis or regurgitation.  ?- The right ventricle is normal in size and function.  ? ? ?Past Medical History:  ?Diagnosis Date  ? Anemia   ? Blind   ? Chronic kidney disease   ? Diabetes mellitus   ? Hypertension   ? Sickle cell disease, type S ([place])   ? ? ?Past Surgical History:  ?Procedure Laterality Date  ? EYE SURGERY Left   ? retinal detachment repair 2017  ? EYE SURGERY Left   ? ? type of surgery to repair blood vessels 2018  ? GANGLION CYST EXCISION    ? Left dorsal wrist  ? RETINAL DETACHMENT SURGERY Left   ? 2017, [place]  ? ? ?MEDICATIONS: ?No current facility-administered medications for this encounter.  ? ? acetaminophen (TYLENOL) 500 MG tablet  ? amLODipine (NORVASC) 10 MG tablet  ? ASPIRIN LOW DOSE 81 MG EC tablet  ? atorvastatin (LIPITOR) 10 MG tablet  ? calcitRIOL (ROCALTROL) 0.25 MCG capsule  ? carvedilol (COREG) 12.5 MG tablet  ? Dulaglutide (TRULICITY) 1.5 AQ/7.6AU SOPN  ? losartan (COZAAR) 50 MG tablet  ? sodium bicarbonate 650 MG tablet  ? Blood Glucose Monitoring Suppl (PRODIGY AUTOCODE  BLOOD GLUCOSE) w/Device KIT  ? ? ?If labs acceptable day of surgery and no acute changes, I anticipate pt can proceed with surgery as scheduled.  ? ?Willeen Cass, PhD, FNP-BC ?[facility]/Anesthesiology ?Phone: [phone] ?03/03/2022 11:27 AM ? ? ? ? ? ? ?

## 2022-03-04 ENCOUNTER — Ambulatory Visit (HOSPITAL_COMMUNITY): Payer: Medicare HMO | Admitting: Emergency Medicine

## 2022-03-04 ENCOUNTER — Ambulatory Visit (HOSPITAL_BASED_OUTPATIENT_CLINIC_OR_DEPARTMENT_OTHER): Payer: Medicare HMO | Admitting: Emergency Medicine

## 2022-03-04 ENCOUNTER — Other Ambulatory Visit: Payer: Self-pay

## 2022-03-04 ENCOUNTER — Encounter (HOSPITAL_COMMUNITY)
Admission: RE | Disposition: A | Discharge status: Home / Self Care | Payer: Self-pay | Source: Home / Self Care | Attending: Vascular Surgery

## 2022-03-04 ENCOUNTER — Encounter (HOSPITAL_COMMUNITY): Payer: Self-pay | Admitting: Vascular Surgery

## 2022-03-04 ENCOUNTER — Ambulatory Visit (HOSPITAL_COMMUNITY)
Admission: RE | Admit: 2022-03-04 | Discharge: 2022-03-04 | Disposition: A | Discharge status: Home / Self Care | Payer: Medicare HMO | Attending: Vascular Surgery | Admitting: Vascular Surgery

## 2022-03-04 DIAGNOSIS — N184 Chronic kidney disease, stage 4 (severe): Secondary | ICD-10-CM

## 2022-03-04 DIAGNOSIS — H548 Legal blindness, as defined in USA: Secondary | ICD-10-CM | POA: Diagnosis not present

## 2022-03-04 DIAGNOSIS — N185 Chronic kidney disease, stage 5: Secondary | ICD-10-CM

## 2022-03-04 DIAGNOSIS — Z6839 Body mass index (BMI) 39.0-39.9, adult: Secondary | ICD-10-CM | POA: Diagnosis not present

## 2022-03-04 DIAGNOSIS — I120 Hypertensive chronic kidney disease with stage 5 chronic kidney disease or end stage renal disease: Secondary | ICD-10-CM | POA: Insufficient documentation

## 2022-03-04 DIAGNOSIS — Z7984 Long term (current) use of oral hypoglycemic drugs: Secondary | ICD-10-CM

## 2022-03-04 DIAGNOSIS — Z7982 Long term (current) use of aspirin: Secondary | ICD-10-CM | POA: Insufficient documentation

## 2022-03-04 DIAGNOSIS — E669 Obesity, unspecified: Secondary | ICD-10-CM | POA: Diagnosis not present

## 2022-03-04 DIAGNOSIS — E1122 Type 2 diabetes mellitus with diabetic chronic kidney disease: Secondary | ICD-10-CM | POA: Diagnosis not present

## 2022-03-04 DIAGNOSIS — N186 End stage renal disease: Secondary | ICD-10-CM

## 2022-03-04 DIAGNOSIS — I129 Hypertensive chronic kidney disease with stage 1 through stage 4 chronic kidney disease, or unspecified chronic kidney disease: Secondary | ICD-10-CM | POA: Diagnosis not present

## 2022-03-04 DIAGNOSIS — D573 Sickle-cell trait: Secondary | ICD-10-CM | POA: Insufficient documentation

## 2022-03-04 HISTORY — DX: Anemia, unspecified: D64.9

## 2022-03-04 HISTORY — PX: AV FISTULA PLACEMENT: SHX1204

## 2022-03-04 LAB — POCT I-STAT, CHEM 8
BUN: 49 mg/dL — ABNORMAL HIGH (ref 6–20)
Calcium, Ion: 1.24 mmol/L (ref 1.15–1.40)
Chloride: 110 mmol/L (ref 98–111)
Creatinine, Ser: 3.9 mg/dL — ABNORMAL HIGH (ref 0.44–1.00)
Glucose, Bld: 128 mg/dL — ABNORMAL HIGH (ref 70–99)
HCT: 35 % — ABNORMAL LOW (ref 36.0–46.0)
Hemoglobin: 11.9 g/dL — ABNORMAL LOW (ref 12.0–15.0)
Potassium: 5.2 mmol/L — ABNORMAL HIGH (ref 3.5–5.1)
Sodium: 143 mmol/L (ref 135–145)
TCO2: 23 mmol/L (ref 22–32)

## 2022-03-04 LAB — GLUCOSE, CAPILLARY
Glucose-Capillary: 130 mg/dL — ABNORMAL HIGH (ref 70–99)
Glucose-Capillary: 111 mg/dL — ABNORMAL HIGH (ref 70–99)

## 2022-03-04 SURGERY — ARTERIOVENOUS (AV) FISTULA CREATION
Anesthesia: Monitor Anesthesia Care | Site: Arm Upper | Laterality: Right

## 2022-03-04 MED ORDER — ONDANSETRON HCL 4 MG/2ML IJ SOLN: 4.0000 mg | Freq: Once | INTRAMUSCULAR | Status: DC | PRN

## 2022-03-04 MED ORDER — CHLORHEXIDINE GLUCONATE 4 % EX LIQD: 60.0000 mL | Freq: Once | CUTANEOUS | Status: DC

## 2022-03-04 MED ORDER — CHLORHEXIDINE GLUCONATE 0.12 % MT SOLN
15.0000 mL | Freq: Once | OROMUCOSAL | Status: AC
  Administered 2022-03-04: 15 mL via OROMUCOSAL
  Filled 2022-03-04: qty 15

## 2022-03-04 MED ORDER — HEPARIN SODIUM (PORCINE) 1000 UNIT/ML IJ SOLN
INTRAMUSCULAR | Status: DC | PRN
  Administered 2022-03-04: 2000 [IU] via INTRAVENOUS

## 2022-03-04 MED ORDER — OXYCODONE HCL 5 MG/5ML PO SOLN: 5.0000 mg | Freq: Once | ORAL | Status: DC | PRN

## 2022-03-04 MED ORDER — OXYCODONE HCL 5 MG PO TABS: 5.0000 mg | ORAL_TABLET | Freq: Once | ORAL | Status: DC | PRN

## 2022-03-04 MED ORDER — AMISULPRIDE (ANTIEMETIC) 5 MG/2ML IV SOLN: 10.0000 mg | Freq: Once | INTRAVENOUS | Status: DC | PRN

## 2022-03-04 MED ORDER — MEPIVACAINE HCL (PF) 2 % IJ SOLN
INTRAMUSCULAR | Status: DC | PRN
  Administered 2022-03-04: 20 mL

## 2022-03-04 MED ORDER — PROPOFOL 500 MG/50ML IV EMUL
INTRAVENOUS | Status: DC | PRN
Start: 2022-03-04 — End: 2022-03-04
  Administered 2022-03-04: 50 ug/kg/min via INTRAVENOUS

## 2022-03-04 MED ORDER — 0.9 % SODIUM CHLORIDE (POUR BTL) OPTIME
TOPICAL | Status: DC | PRN
  Administered 2022-03-04: 1000 mL

## 2022-03-04 MED ORDER — FENTANYL CITRATE (PF) 250 MCG/5ML IJ SOLN
INTRAMUSCULAR | Status: AC
Start: 2022-03-04 — End: ?
  Filled 2022-03-04: qty 5

## 2022-03-04 MED ORDER — HEPARIN 6000 UNIT IRRIGATION SOLUTION
Status: AC
  Filled 2022-03-04: qty 500

## 2022-03-04 MED ORDER — LACTATED RINGERS IV SOLN: INTRAVENOUS | Status: DC

## 2022-03-04 MED ORDER — MIDAZOLAM HCL 2 MG/2ML IJ SOLN
INTRAMUSCULAR | Status: DC | PRN
  Administered 2022-03-04 (×2): 1 mg via INTRAVENOUS

## 2022-03-04 MED ORDER — ORAL CARE MOUTH RINSE: 15.0000 mL | Freq: Once | OROMUCOSAL | Status: AC

## 2022-03-04 MED ORDER — INSULIN ASPART 100 UNIT/ML IJ SOLN: 0.0000 [IU] | INTRAMUSCULAR | Status: DC | PRN

## 2022-03-04 MED ORDER — MIDAZOLAM HCL 2 MG/2ML IJ SOLN
INTRAMUSCULAR | Status: AC
  Filled 2022-03-04: qty 2

## 2022-03-04 MED ORDER — CEFAZOLIN SODIUM-DEXTROSE 2-4 GM/100ML-% IV SOLN
2.0000 g | INTRAVENOUS | Status: AC
  Administered 2022-03-04: 2 g via INTRAVENOUS
  Filled 2022-03-04: qty 100

## 2022-03-04 MED ORDER — HEPARIN 6000 UNIT IRRIGATION SOLUTION
Status: DC | PRN
  Administered 2022-03-04: 1

## 2022-03-04 MED ORDER — FENTANYL CITRATE (PF) 250 MCG/5ML IJ SOLN
INTRAMUSCULAR | Status: DC | PRN
  Administered 2022-03-04: 25 ug via INTRAVENOUS
  Administered 2022-03-04: 50 ug via INTRAVENOUS

## 2022-03-04 MED ORDER — OXYCODONE-ACETAMINOPHEN 5-325 MG PO TABS: 1.0000 | ORAL_TABLET | Freq: Four times a day (QID) | ORAL | 0 refills | Status: DC | PRN

## 2022-03-04 MED ORDER — FENTANYL CITRATE (PF) 100 MCG/2ML IJ SOLN: 25.0000 ug | INTRAMUSCULAR | Status: DC | PRN

## 2022-03-04 MED ORDER — LIDOCAINE-EPINEPHRINE (PF) 1 %-1:200000 IJ SOLN
INTRAMUSCULAR | Status: AC
  Filled 2022-03-04: qty 30

## 2022-03-04 MED ORDER — PROPOFOL 10 MG/ML IV BOLUS
INTRAVENOUS | Status: DC | PRN
  Administered 2022-03-04 (×3): 20 mg via INTRAVENOUS

## 2022-03-04 MED ORDER — SODIUM CHLORIDE 0.9 % IV SOLN: INTRAVENOUS | Status: DC

## 2022-03-04 SURGICAL SUPPLY — 36 items
ARMBAND PINK RESTRICT EXTREMIT (MISCELLANEOUS) ×2 IMPLANT
BAG COUNTER SPONGE SURGICOUNT (BAG) ×2 IMPLANT
BLADE CLIPPER SURG (BLADE) ×2 IMPLANT
CANISTER SUCT 3000ML PPV (MISCELLANEOUS) ×2 IMPLANT
CLIP LIGATING EXTRA MED SLVR (CLIP) ×2 IMPLANT
CLIP LIGATING EXTRA SM BLUE (MISCELLANEOUS) ×2 IMPLANT
CLIP VESOCCLUDE MED 6/CT (CLIP) ×2 IMPLANT
COVER PROBE W GEL 5X96 (DRAPES) ×2 IMPLANT
DECANTER SPIKE VIAL GLASS SM (MISCELLANEOUS) ×2 IMPLANT
DERMABOND ADVANCED (GAUZE/BANDAGES/DRESSINGS) ×1
DERMABOND ADVANCED .7 DNX12 (GAUZE/BANDAGES/DRESSINGS) ×1 IMPLANT
ELECT REM PT RETURN 9FT ADLT (ELECTROSURGICAL) ×2
ELECTRODE REM PT RTRN 9FT ADLT (ELECTROSURGICAL) ×1 IMPLANT
GLOVE SRG 8 PF TXTR STRL LF DI (GLOVE) ×2 IMPLANT
GLOVE SURG POLYISO LF SZ8 (GLOVE) IMPLANT
GLOVE SURG UNDER POLY LF SZ8 (GLOVE) ×4
GOWN STRL REUS W/ TWL LRG LVL3 (GOWN DISPOSABLE) ×2 IMPLANT
GOWN STRL REUS W/TWL 2XL LVL3 (GOWN DISPOSABLE) ×2 IMPLANT
GOWN STRL REUS W/TWL LRG LVL3 (GOWN DISPOSABLE) ×4
KIT BASIN OR (CUSTOM PROCEDURE TRAY) ×2 IMPLANT
KIT TURNOVER KIT B (KITS) ×2 IMPLANT
NS IRRIG 1000ML POUR BTL (IV SOLUTION) ×2 IMPLANT
PACK CV ACCESS (CUSTOM PROCEDURE TRAY) ×2 IMPLANT
PAD ARMBOARD 7.5X6 YLW CONV (MISCELLANEOUS) ×4 IMPLANT
SLING ARM FOAM STRAP LRG (SOFTGOODS) IMPLANT
SLING ARM FOAM STRAP MED (SOFTGOODS) IMPLANT
SUT MNCRL AB 4-0 PS2 18 (SUTURE) ×2 IMPLANT
SUT PROLENE 6 0 BV (SUTURE) ×2 IMPLANT
SUT PROLENE 7 0 BV 1 (SUTURE) IMPLANT
SUT SILK 2 0 SH (SUTURE) ×2 IMPLANT
SUT SILK 3 0 SH CR/8 (SUTURE) ×2 IMPLANT
SUT VIC AB 3-0 SH 27 (SUTURE) ×2
SUT VIC AB 3-0 SH 27X BRD (SUTURE) ×1 IMPLANT
TOWEL GREEN STERILE (TOWEL DISPOSABLE) ×2 IMPLANT
UNDERPAD 30X36 HEAVY ABSORB (UNDERPADS AND DIAPERS) ×2 IMPLANT
WATER STERILE IRR 1000ML POUR (IV SOLUTION) ×2 IMPLANT

## 2022-03-04 NOTE — Anesthesia Procedure Notes (Signed)
Procedure Name: [place] ?Date/Time: 03/04/2022 7:32 AM ?Performed by: Lorie Phenix, CRNA ?Pre-anesthesia Checklist: Patient identified, Emergency Drugs available, Suction available and Patient being monitored ?Patient Re-evaluated:Patient Re-evaluated prior to induction ?Oxygen Delivery Method: Simple face mask ?Placement Confirmation: positive ETCO2 ? ? ? ? ?

## 2022-03-04 NOTE — Transfer of Care (Signed)
Immediate Anesthesia Transfer of Care Note ? ?Patient: Sarah Kline ? ?Procedure(s) Performed: RIGHT ARM BRACHIOCEPHALIC FISTULA CREATION (Right: Arm Upper) ? ?Patient Location: PACU ? ?Anesthesia Type:MAC and Regional ? ?Level of Consciousness: awake and alert  ? ?Airway & Oxygen Therapy: Patient Spontanous Breathing ? ?Post-op Assessment: Report given to RN and Post -op Vital signs reviewed and stable ? ?Post vital signs: Reviewed and stable ? ?Last Vitals:  ?Vitals Value Taken Time  ?BP 168/58 03/04/22 0851  ?Temp    ?Pulse 74 03/04/22 0854  ?Resp 34 03/04/22 0854  ?SpO2 94 % 03/04/22 0854  ?Vitals shown include unvalidated device data. ? ?Last Pain:  ?Vitals:  ? 03/04/22 0632  ?TempSrc:   ?PainSc: 0-No pain  ?   ? ?Patients Stated Pain Goal: 1 (03/04/22 0175) ? ?Complications: No notable events documented. ?

## 2022-03-04 NOTE — Anesthesia Postprocedure Evaluation (Signed)
Anesthesia Post Note ? ?Patient: Sarah Kline ? ?Procedure(s) Performed: RIGHT ARM BRACHIOCEPHALIC FISTULA CREATION (Right: Arm Upper) ? ?  ? ?Patient location during evaluation: PACU ?Anesthesia Type: Regional and MAC ?Level of consciousness: awake and alert ?Pain management: pain level controlled ?Vital Signs Assessment: post-procedure vital signs reviewed and stable ?Respiratory status: spontaneous breathing, nonlabored ventilation and respiratory function stable ?Cardiovascular status: blood pressure returned to baseline and stable ?Postop Assessment: no apparent nausea or vomiting ?Anesthetic complications: no ? ? ?No notable events documented. ? ?Last Vitals:  ?Vitals:  ? 03/04/22 0850 03/04/22 0905  ?BP: (!) 150/80 (!) 169/78  ?Pulse: 72 71  ?Resp: 14 15  ?Temp: 36.5 ?C   ?SpO2: 93% 93%  ?  ?Last Pain:  ?Vitals:  ? 03/04/22 0850  ?TempSrc:   ?PainSc: 0-No pain  ? ? ?  ?  ?  ?  ?  ?  ? ?Jarome Matin Shirin Echeverry ? ? ? ? ?

## 2022-03-04 NOTE — Op Note (Signed)
? ? ?  NAME: Sarah Kline    MRN: [id number] ?DOB: 19-May-1966    DATE OF OPERATION: 03/04/2022 ? ?PREOP DIAGNOSIS:   ? ?Chronic kidney disease stage 4 ? ?POSTOP DIAGNOSIS:   ? ?Same  ? ?PROCEDURE:  ?  ?Right arm brachiocephalic fistula creation ? ?SURGEON: Broadus John ? ?ASSIST: Corrie Baglia ? ?ANESTHESIA: Moderate, Block  ? ?EBL: 27m ? ?INDICATIONS:  ? ? Sarah EFFERTZis a 56y.o. female who presents with chronic kidney disease stage 4. Based on vein mapping and examination, Dereon's superficial veins have dilated nicely.  She has adequate right-sided cephalic, right-sided basilic and left-sided basilic veins for access creation.  We discussed right-sided brachiocephalic fistula creation as it may not require superficialization prior to use. After discussing the risk and benefits of right-sided brachiocephalic fistula creation, Sarah Kline elected to proceed. ? ?FINDINGS:  ?High brachial artery bifurcation, 2.532mradial artery ?31m41mephalic vein ? ?TECHNIQUE:  ? ?A right upper extremity block was performed by the anesthesia team. The patient was brought to the operating room and placed in supine position. The right arm was prepped and draped in standard fashion. IV antibiotics were administered. A timeout was performed.  ? ?The case began with ultrasound insonation of the brachial artery and cephalic vein, which demonstrated sufficient size at the antecubital fossa for arteriovenous fistula. There was a high bifurcation of the brachial artery, with a 2.5mm68mdial artery. ? ?A transverse incision was made below the elbow creese in the antecubital fossa. The  cephalic vein was isolated for 3 cm in length. Next the aponeurosis was partially released and the radial artery artery secured with a vessel loop. This was small, roughly 2.5 cm, but with an excellent pulse. The ulnar artery was then Identified. It was larger, but more medial. The palmer arch was insonated with each being occluded separately. The ulnar  appeared to be the primary vessel perfusing the hand. I made the decision to use to radial artery. The patient was heparinized. The cephalic vein was transected and ligated distally with a 2-0 silk stick-tie. The vein was dilated with coronary dilators to 5mm 96m flushed with heparin saline. Vascular clamps were placed proximally and distally on the brachial artery and a 5 mm arteriotomy was created on the brachial artery. This was flushed with heparin saline.  An anastomosis was created in end to side fashion on the brachial artery using running 6-0 Prolene suture. ? ?Prior to completing the anastomosis, the vessels were flushed and the suture line was tied down. There was an excellent thrill in the cephalic vein from the anastomosis to the mid upper bicipital region. The patient had a multiphasic radial and ulnar signal. He had an excellent doppler signal in the fistula. The incision was irrigated and hemostasis achieved with cautery and suture. The deeper tissue was closed with 3-0 Vicryl and the skin closed with 4-0 Monocryl. ? ?Dermabond was applied to the incisions. The patient was transferred to PACU in stable condition. ? ?Given the complexity of the case a first assistant was necessary in order to expedient the procedure and safely perform the technical aspects of the operation. ? ?JoshuMacie Burows?[facility]:   03/04/2022 ? ?

## 2022-03-04 NOTE — Progress Notes (Deleted)
?Office Note  ? ?Patient seen and examined in preop holding.  Sarah complaints. ?Sarah changes to medication history or physical exam since last seen in clinic. ?After discussing the risks and benefits of right arm fistula creation, Sarah Kline elected to proceed.  ? ?Sarah John MD ? ? ?CC:  ESRD ?Requesting Provider:  No ref. provider Kline ? ?HPI: Sarah Kline is a Right handed 56 y.o. ([birth year]) female with kidney disease who presents in follow up for access. At her last visit, bilateral upper extremity veins were too small for AV fistula craetion. The pt did not want to pursue graft, and asked to come back on one month after completing arm exercises to see if her veins would grow. at the request of Sarah Kline for permanent HD access. The patient has had Sarah prior access procedures. Sarah previous tunneled lines. ? ?On exam, Sarah Kline was doing well.  She was accompanied by her husband.  Both graduates of [employer], they met years later.  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  She has worked with services for the blind from to improve her daily activity.  She had several questions regarding dialysis access as well as dialysis ? ?The pt is  on a statin for cholesterol management.  ?The pt is  on a daily aspirin.   Other AC:  - ?The pt is  on medications for hypertension.   ?The pt is  diabetic. ?Tobacco hx:  - ? ?Past Medical History:  ?Diagnosis Date  ? Anemia   ? Blind   ? Chronic kidney disease   ? Diabetes mellitus   ? Hypertension   ? Sickle cell disease, type S ([place])   ? ? ?Past Surgical History:  ?Procedure Laterality Date  ? EYE SURGERY Left   ? retinal detachment repair 2017  ? EYE SURGERY Left   ? ? type of surgery to repair blood vessels 2018  ? GANGLION CYST EXCISION    ? Left dorsal wrist  ? RETINAL DETACHMENT SURGERY Left   ? 2017, [place]  ? ? ?Social History  ? ?Socioeconomic History  ? Marital status: Married  ?  Spouse name:  Not on file  ? Number of children: Not on file  ? Years of education: Not on file  ? Highest education level: Not on file  ?Occupational History  ? Not on file  ?Tobacco Use  ? Smoking status: Never  ? Smokeless tobacco: Never  ?Vaping Use  ? Vaping Use: Never used  ?Substance and Sexual Activity  ? Alcohol use: Yes  ?  Alcohol/week: 2.0 standard drinks  ?  Types: 2 Shots of liquor per week  ?  Comment: twice per month  ? Drug use: Sarah  ?  Comment: Very remote marijuana use, 20 years ago  ? Sexual activity: Not on file  ?Other Topics Concern  ? Not on file  ?Social History Narrative  ? Not on file  ? ?Social Determinants of Health  ? ?Financial Resource Strain: Not on file  ?Food Insecurity: Not on file  ?Transportation Needs: Not on file  ?Physical Activity: Not on file  ?Stress: Not on file  ?Social Connections: Not on file  ?Intimate Partner Violence: Not on file  ? ? ?Family History  ?Problem Relation Age of Onset  ? Asthma Mother   ? COPD Mother   ? Hypertension Mother   ? Stroke Mother   ? Diabetes Daughter   ?  Diabetes Son   ? ? ?Current Facility-Administered Medications  ?Medication Dose Route Frequency Provider Last Rate Last Admin  ? 0.9 %  sodium chloride infusion   Intravenous Continuous Sarah John, MD      ? ceFAZolin (ANCEF) IVPB 2g/100 mL premix  2 g Intravenous 30 min Pre-Op Sarah John, MD      ? chlorhexidine (HIBICLENS) 4 % liquid 4 application.  60 mL Topical Once Sarah John, MD      ? And  ? [START ON 03/05/2022] chlorhexidine (HIBICLENS) 4 % liquid 4 application.  60 mL Topical Once Sarah John, MD      ? insulin aspart (novoLOG) injection 0-14 Units  0-14 Units Subcutaneous Q2H PRN Nunzio Cobbs M, DO      ? lactated ringers infusion   Intravenous Continuous Pervis Hocking, DO      ? ? ?Allergies  ?Allergen Reactions  ? Bactrim [Sulfamethoxazole-Trimethoprim] Rash  ? ? ? ?REVIEW OF SYSTEMS:  ? ?_0  denotes positive finding, _1  denotes negative finding ?Cardiac   Comments:  ?Chest pain or chest pressure:    ?Shortness of breath upon exertion:    ?Short of breath when lying flat:    ?Irregular heart rhythm:    ?    ?Vascular    ?Pain in calf, thigh, or hip brought on by ambulation:    ?Pain in feet at night that wakes you up from your sleep:     ?Blood clot in your veins:    ?Leg swelling:     ?    ?Pulmonary    ?Oxygen at home:    ?Productive cough:     ?Wheezing:     ?    ?Neurologic    ?Sudden weakness in arms or legs:     ?Sudden numbness in arms or legs:     ?Sudden onset of difficulty speaking or slurred speech:    ?Temporary loss of vision in one eye:     ?Problems with dizziness:     ?    ?Gastrointestinal    ?Blood in stool:     ?Vomited blood:     ?    ?Genitourinary    ?Burning when urinating:     ?Blood in urine:    ?    ?Psychiatric    ?Major depression:     ?    ?Hematologic    ?Bleeding problems:    ?Problems with blood clotting too easily:    ?    ?Skin    ?Rashes or ulcers:    ?    ?Constitutional    ?Fever or chills:    ? ? ?PHYSICAL EXAMINATION: ? ?Vitals:  ? 03/03/22 0945 03/04/22 0624  ?BP:  (!) 143/68  ?Pulse:  71  ?Resp:  18  ?Temp:  97.8 ?F (36.6 ?C)  ?TempSrc:  Oral  ?SpO2:  98%  ?Weight: 108.9 kg 103.9 kg  ?Height: _2  (1.626 m) _3  (1.626 m)  ? ? ? ?General:  WDWN in NAD; vital signs documented above ?Gait: Not observed ?HENT: WNL, normocephalic ?Pulmonary: normal non-labored breathing , without Rales, rhonchi,  wheezing ?Cardiac: regular HR,  ?Abdomen: soft, NT, Sarah masses ?Skin: without rashes ?Vascular Exam/Pulses: ? Right Left  ?Radial 2+ (normal) 2+ (normal)  ?Ulnar 2+ (normal) 2+ (normal)  ?Femoral    ?Popliteal    ?DP 2+ (normal) 2+ (normal)  ?PT 2+ (normal) 2+ (normal)  ? ?Extremities: without ischemic changes, without Gangrene , without  cellulitis; without open wounds;  ?Musculoskeletal: Sarah muscle wasting or atrophy  ?Neurologic: A&O X 3;  Sarah focal weakness or paresthesias are detected ?Psychiatric:  The pt has Normal  affect. ? ? ?Non-Invasive Vascular Imaging:   ?Sufficient intraluminal diameter of brachial arteries bilaterally ?Bilateral superficial femoral artery veins small ? ? ? ?ASSESSMENT/PLAN: ? ?Sarah Kline is a 56 y.o. female who presents with chronic kidney disease stage 4 ? ?Based on vein mapping and examination, Sarah Kline superficial veins have dilated nicely.  She has adequate right-sided cephalic, right-sided basilic and left-sided basilic veins for access creation.  We discussed right-sided brachiocephalic fistula creation as it may not require superficialization prior to use.  ?After discussing the risk and benefits of right-sided brachiocephalic fistula creation, Alexei elected to proceed. ? ? ?Sarah John, MD ?[facility] ?[phone] ? ?

## 2022-03-04 NOTE — H&P (Signed)
?Office Note  ? ?Patient seen and examined in preop holding.  No complaints. ?No changes to medication history or physical exam since last seen in clinic. ?After discussing the risks and benefits of right arm fistula creation, Sarah Kline elected to proceed.  ? ?Broadus John MD ? ? ?CC:  ESRD ?Requesting Provider:  No ref. provider found ? ?HPI: Sarah Kline is a Right handed 56 y.o. ([birth year]) female with kidney disease who presents in follow up for access. At her last visit, bilateral upper extremity veins were too small for AV fistula craetion. The pt did not want to pursue graft, and asked to come back on one month after completing arm exercises to see if her veins would grow. at the request of No ref. provider found for permanent HD access. The patient has had no prior access procedures. No previous tunneled lines. ? ?On exam, Sarah Kline was doing well.  She was accompanied by her husband.  Both graduates of [employer], they met years later.  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  She has worked with services for the blind from to improve her daily activity.  She had several questions regarding dialysis access as well as dialysis ? ?The pt is  on a statin for cholesterol management.  ?The pt is  on a daily aspirin.   Other AC:  - ?The pt is  on medications for hypertension.   ?The pt is  diabetic. ?Tobacco hx:  - ? ?Past Medical History:  ?Diagnosis Date  ? Anemia   ? Blind   ? Chronic kidney disease   ? Diabetes mellitus   ? Hypertension   ? Sickle cell disease, type S ([place])   ? ? ?Past Surgical History:  ?Procedure Laterality Date  ? EYE SURGERY Left   ? retinal detachment repair 2017  ? EYE SURGERY Left   ? ? type of surgery to repair blood vessels 2018  ? GANGLION CYST EXCISION    ? Left dorsal wrist  ? RETINAL DETACHMENT SURGERY Left   ? 2017, [place]  ? ? ?Social History  ? ?Socioeconomic History  ? Marital status: Married  ?  Spouse name:  Not on file  ? Number of children: Not on file  ? Years of education: Not on file  ? Highest education level: Not on file  ?Occupational History  ? Not on file  ?Tobacco Use  ? Smoking status: Never  ? Smokeless tobacco: Never  ?Vaping Use  ? Vaping Use: Never used  ?Substance and Sexual Activity  ? Alcohol use: Yes  ?  Alcohol/week: 2.0 standard drinks  ?  Types: 2 Shots of liquor per week  ?  Comment: twice per month  ? Drug use: No  ?  Comment: Very remote marijuana use, 20 years ago  ? Sexual activity: Not on file  ?Other Topics Concern  ? Not on file  ?Social History Narrative  ? Not on file  ? ?Social Determinants of Health  ? ?Financial Resource Strain: Not on file  ?Food Insecurity: Not on file  ?Transportation Needs: Not on file  ?Physical Activity: Not on file  ?Stress: Not on file  ?Social Connections: Not on file  ?Intimate Partner Violence: Not on file  ? ? ?Family History  ?Problem Relation Age of Onset  ? Asthma Mother   ? COPD Mother   ? Hypertension Mother   ? Stroke Mother   ? Diabetes Daughter   ?  Diabetes Son   ? ? ?Current Facility-Administered Medications  ?Medication Dose Route Frequency Provider Last Rate Last Admin  ? 0.9 %  sodium chloride infusion   Intravenous Continuous Broadus John, MD      ? ceFAZolin (ANCEF) IVPB 2g/100 mL premix  2 g Intravenous 30 min Pre-Op Broadus John, MD      ? chlorhexidine (HIBICLENS) 4 % liquid 4 application.  60 mL Topical Once Broadus John, MD      ? And  ? [START ON 03/05/2022] chlorhexidine (HIBICLENS) 4 % liquid 4 application.  60 mL Topical Once Broadus John, MD      ? insulin aspart (novoLOG) injection 0-14 Units  0-14 Units Subcutaneous Q2H PRN Nunzio Cobbs M, DO      ? lactated ringers infusion   Intravenous Continuous Pervis Hocking, DO      ? ? ?Allergies  ?Allergen Reactions  ? Bactrim [Sulfamethoxazole-Trimethoprim] Rash  ? ? ? ?REVIEW OF SYSTEMS:  ? ?_0  denotes positive finding, _1  denotes negative finding ?Cardiac   Comments:  ?Chest pain or chest pressure:    ?Shortness of breath upon exertion:    ?Short of breath when lying flat:    ?Irregular heart rhythm:    ?    ?Vascular    ?Pain in calf, thigh, or hip brought on by ambulation:    ?Pain in feet at night that wakes you up from your sleep:     ?Blood clot in your veins:    ?Leg swelling:     ?    ?Pulmonary    ?Oxygen at home:    ?Productive cough:     ?Wheezing:     ?    ?Neurologic    ?Sudden weakness in arms or legs:     ?Sudden numbness in arms or legs:     ?Sudden onset of difficulty speaking or slurred speech:    ?Temporary loss of vision in one eye:     ?Problems with dizziness:     ?    ?Gastrointestinal    ?Blood in stool:     ?Vomited blood:     ?    ?Genitourinary    ?Burning when urinating:     ?Blood in urine:    ?    ?Psychiatric    ?Major depression:     ?    ?Hematologic    ?Bleeding problems:    ?Problems with blood clotting too easily:    ?    ?Skin    ?Rashes or ulcers:    ?    ?Constitutional    ?Fever or chills:    ? ? ?PHYSICAL EXAMINATION: ? ?Vitals:  ? 03/03/22 0945 03/04/22 0624  ?BP:  (!) 143/68  ?Pulse:  71  ?Resp:  18  ?Temp:  97.8 ?F (36.6 ?C)  ?TempSrc:  Oral  ?SpO2:  98%  ?Weight: 108.9 kg 103.9 kg  ?Height: _2  (1.626 m) _3  (1.626 m)  ? ? ? ?General:  WDWN in NAD; vital signs documented above ?Gait: Not observed ?HENT: WNL, normocephalic ?Pulmonary: normal non-labored breathing , without Rales, rhonchi,  wheezing ?Cardiac: regular HR,  ?Abdomen: soft, NT, no masses ?Skin: without rashes ?Vascular Exam/Pulses: ? Right Left  ?Radial 2+ (normal) 2+ (normal)  ?Ulnar 2+ (normal) 2+ (normal)  ?Femoral    ?Popliteal    ?DP 2+ (normal) 2+ (normal)  ?PT 2+ (normal) 2+ (normal)  ? ?Extremities: without ischemic changes, without Gangrene , without  cellulitis; without open wounds;  ?Musculoskeletal: no muscle wasting or atrophy  ?Neurologic: A&O X 3;  No focal weakness or paresthesias are detected ?Psychiatric:  The pt has Normal  affect. ? ? ?Non-Invasive Vascular Imaging:   ?Sufficient intraluminal diameter of brachial arteries bilaterally ?Bilateral superficial femoral artery veins small ? ? ? ?ASSESSMENT/PLAN: ? ?NICHOL ATOR is a 56 y.o. female who presents with chronic kidney disease stage 4 ? ?Based on vein mapping and examination, Maggie's superficial veins have dilated nicely.  She has adequate right-sided cephalic, right-sided basilic and left-sided basilic veins for access creation.  We discussed right-sided brachiocephalic fistula creation as it may not require superficialization prior to use.  ?After discussing the risk and benefits of right-sided brachiocephalic fistula creation, Alexei elected to proceed. ? ? ?Broadus John, MD ?[facility] ?[phone] ? ?

## 2022-03-04 NOTE — Discharge Instructions (Signed)
? ?[facility] [place] ? ?Discharge Instructions ? ?AV Fistula or Graft Surgery for Dialysis Access ? ?Please refer to the following instructions for your post-procedure care. Your surgeon or physician assistant will discuss any changes with you. ? ?Activity ? ?You may drive the day following your surgery, if you are comfortable and no longer taking prescription pain medication. Resume full activity as the soreness in your incision resolves. ? ?Bathing/Showering ? ?You may shower after you go home. Keep your incision dry for 48 hours. Do not soak in a bathtub, hot tub, or swim until the incision heals completely. You may not shower if you have a hemodialysis catheter. ? ?Incision Care ? ?Clean your incision with mild soap and water after 48 hours. Pat the area dry with a clean towel. You do not need a bandage unless otherwise instructed. Do not apply any ointments or creams to your incision. You may have skin glue on your incision. Do not peel it off. It will come off on its own in about one week. Your arm may swell a bit after surgery. To reduce swelling use pillows to elevate your arm so it is above your heart. Your doctor will tell you if you need to lightly wrap your arm with an ACE bandage. ? ?Diet ? ?Resume your normal diet. There are not special food restrictions following this procedure. In order to heal from your surgery, it is CRITICAL to get adequate nutrition. Your body requires vitamins, minerals, and protein. Vegetables are the best source of vitamins and minerals. Vegetables also provide the perfect balance of protein. Processed food has little nutritional value, so try to avoid this. ? ?Medications ? ?Resume taking all of your medications. If your incision is causing pain, you may take over-the counter pain relievers such as acetaminophen (Tylenol). If you were prescribed a stronger pain medication, please be aware these medications can cause nausea and constipation. Prevent  nausea by taking the medication with a snack or meal. Avoid constipation by drinking plenty of fluids and eating foods with high amount of fiber, such as fruits, vegetables, and grains.  ?Do not take Tylenol if you are taking prescription pain medications. ? ?Follow up ?Your surgeon may want to see you in the office following your access surgery. If so, this will be arranged at the time of your surgery. ? ?Please call us immediately for any of the following conditions: ? ?Increased pain, redness, drainage (pus) from your incision site ?Fever of 101 degrees or higher ?Severe or worsening pain at your incision site ?Hand pain or numbness. ? ?Reduce your risk of vascular disease: ? ?Stop smoking. If you would like help, call [facility] at 1-800-QUIT-NOW (984) 031-0906) or [place] at (718)338-3091 ? ?Manage your cholesterol ?Maintain a desired weight ?Control your diabetes ?Keep your blood pressure down ? ?Dialysis ? ?It will take several weeks to several months for your new dialysis access to be ready for use. Your surgeon will determine when it is okay to use it. Your nephrologist will continue to direct your dialysis. You can continue to use your Permcath until your new access is ready for use. ? ? ?03/04/2022 ?Sarah Kline ?[id number] ?[birth year] ? ?Surgeon(s): ?Broadus John, MD ? ?Procedure(s): ?RIGHT ARM BRACHIOCEPHALIC FISTULA CREATION ? ? May stick graft immediately  ? May stick graft on designated area only:   ?X Do not stick Right AV fistula for 12 weeks  ? ? ?If you have any questions, please call the office at  [phone]. ?

## 2022-03-04 NOTE — Anesthesia Procedure Notes (Signed)
Anesthesia Regional Block: Supraclavicular block  ? ?Pre-Anesthetic Checklist: , timeout performed,  Correct Patient, Correct Site, Correct Laterality,  Correct Procedure, Correct Position, site marked,  Risks and benefits discussed,  Surgical consent,  Pre-op evaluation,  At surgeon's request and post-op pain management ? ?Laterality: Right ? ?Prep: Maximum Sterile Barrier Precautions used, chloraprep     ?  ?Needles:  ?Injection technique: Single-shot ? ?Needle Type: Echogenic Stimulator Needle   ? ? ?Needle Length: 9cm  ?Needle Gauge: 22  ? ? ? ?Additional Needles: ? ? ?Procedures:,,,, ultrasound used (permanent image in chart),,    ?Narrative:  ?Start time: 03/04/2022 7:05 AM ?End time: 03/04/2022 7:10 AM ?Injection made incrementally with aspirations every 5 mL. ? ?Performed by: Personally  ?Anesthesiologist: Pervis Hocking, DO ? ?Additional Notes: ?Monitors applied. No increased pain on injection. No increased resistance to injection. Injection made in 5cc increments. Good needle visualization. Patient tolerated procedure well.  ? ? ? ? ?

## 2022-03-05 ENCOUNTER — Encounter (HOSPITAL_COMMUNITY): Payer: Self-pay | Admitting: Vascular Surgery

## 2022-04-03 ENCOUNTER — Other Ambulatory Visit: Payer: Self-pay | Admitting: Student

## 2022-04-03 DIAGNOSIS — Z1231 Encounter for screening mammogram for malignant neoplasm of breast: Secondary | ICD-10-CM

## 2022-04-09 ENCOUNTER — Other Ambulatory Visit: Payer: Self-pay | Admitting: *Deleted

## 2022-04-09 DIAGNOSIS — N184 Chronic kidney disease, stage 4 (severe): Secondary | ICD-10-CM

## 2022-04-18 ENCOUNTER — Ambulatory Visit (HOSPITAL_COMMUNITY)
Admission: RE | Admit: 2022-04-18 | Discharge: 2022-04-18 | Disposition: A | Discharge status: Home / Self Care | Payer: Medicare HMO | Source: Ambulatory Visit | Attending: Vascular Surgery | Admitting: Vascular Surgery

## 2022-04-18 ENCOUNTER — Ambulatory Visit (INDEPENDENT_AMBULATORY_CARE_PROVIDER_SITE_OTHER): Payer: Medicare HMO | Admitting: Vascular Surgery

## 2022-04-18 VITALS — BP 147/79 | HR 71 | Temp 97.8°F | Resp 20 | Ht 64.0 in | Wt 229.0 lb

## 2022-04-18 DIAGNOSIS — N184 Chronic kidney disease, stage 4 (severe): Secondary | ICD-10-CM | POA: Insufficient documentation

## 2022-04-19 NOTE — Progress Notes (Signed)
Office Note     CC:  ESRD Requesting Provider:  Cipriano Mile, NP  HPI: Sarah Kline is a Right handed 56 y.o. (29-Nov-1966) female with kidney disease who presents for wound check status post right-sided brachiocephalic fistula creation.   On exam, Sarah Kline was doing well, accompanied by her husband.  She denied issues with wound healing, denied drainage.  No symptoms of steal syndrome, does appreciate an area of waxing and waning paresthesia on the dorsal aspect of the hand near the fourth digit.  No motor deficits.  The pt is  on a statin for cholesterol management.  The pt is  on a daily aspirin.   Other AC:  - The pt is  on medications for hypertension.   The pt is  diabetic. Tobacco hx:  -  Past Medical History:  Diagnosis Date   Anemia    Blind    Chronic kidney disease    Diabetes mellitus    Hypertension    Sickle cell disease, type S ([place])     Past Surgical History:  Procedure Laterality Date   AV FISTULA PLACEMENT Right 03/04/2022   Procedure: RIGHT ARM BRACHIOCEPHALIC FISTULA CREATION;  Surgeon: Broadus John, MD;  Location: [facility] OR;  Service: Vascular;  Laterality: Right;  PERIPHERAL NERVE BLOCK   EYE SURGERY Left    retinal detachment repair 2017   EYE SURGERY Left    ? type of surgery to repair blood vessels 2018   GANGLION CYST EXCISION     Left dorsal wrist   RETINAL DETACHMENT SURGERY Left    2017, [place] History   Socioeconomic History   Marital status: Married    Spouse name: Not on file   Number of children: Not on file   Years of education: Not on file   Highest education level: Not on file  Occupational History   Not on file  Tobacco Use   Smoking status: Never   Smokeless tobacco: Never  Vaping Use   Vaping Use: Never used  Substance and Sexual Activity   Alcohol use: Yes    Alcohol/week: 2.0 standard drinks    Types: 2 Shots of liquor per week    Comment: twice per month   Drug use: No    Comment: Very remote  marijuana use, 20 years ago   Sexual activity: Not on file  Other Topics Concern   Not on file  Social History Narrative   Not on file   Social Determinants of Health   Financial Resource Strain: Not on file  Food Insecurity: Not on file  Transportation Needs: Not on file  Physical Activity: Not on file  Stress: Not on file  Social Connections: Not on file  Intimate Partner Violence: Not on file    Family History  Problem Relation Age of Onset   Asthma Mother    COPD Mother    Hypertension Mother    Stroke Mother    Diabetes Daughter    Diabetes Son     Current Outpatient Medications  Medication Sig Dispense Refill   acetaminophen (TYLENOL) 500 MG tablet Take 1,000 mg by mouth every 6 (six) hours as needed for moderate pain.     amLODipine (NORVASC) 10 MG tablet TAKE 1 TABLET EVERY EVENING 90 tablet 3   ASPIRIN LOW DOSE 81 MG EC tablet TAKE 1 TABLET BY MOUTH EVERY DAY 90 tablet 3   atorvastatin (LIPITOR) 10 MG tablet Take 10 mg by mouth at bedtime.  Blood Glucose Monitoring Suppl (PRODIGY AUTOCODE BLOOD GLUCOSE) w/Device KIT 1 kit by Does not apply route as directed.     calcitRIOL (ROCALTROL) 0.25 MCG capsule TAKE 1 CAPSULE EVERY DAY 90 capsule 3   carvedilol (COREG) 12.5 MG tablet Take 12.5 mg by mouth 2 (two) times daily.     Dulaglutide (TRULICITY) 1.5 KV/4.2VZ SOPN Inject 1.5 mg into the skin once a week. 12 pen 0   losartan (COZAAR) 50 MG tablet Take 50 mg by mouth at bedtime.     oxyCODONE-acetaminophen (PERCOCET) 5-325 MG tablet Take 1 tablet by mouth every 6 (six) hours as needed for severe pain. 6 tablet 0   sodium bicarbonate 650 MG tablet Take 1,300 mg by mouth 2 (two) times daily.  6   No current facility-administered medications for this visit.    Allergies  Allergen Reactions   Bactrim [Sulfamethoxazole-Trimethoprim] Rash     REVIEW OF SYSTEMS:   '[X]'  denotes positive finding, '[ ]'  denotes negative finding Cardiac  Comments:  Chest pain or chest  pressure:    Shortness of breath upon exertion:    Short of breath when lying flat:    Irregular heart rhythm:        Vascular    Pain in calf, thigh, or hip brought on by ambulation:    Pain in feet at night that wakes you up from your sleep:     Blood clot in your veins:    Leg swelling:         Pulmonary    Oxygen at home:    Productive cough:     Wheezing:         Neurologic    Sudden weakness in arms or legs:     Sudden numbness in arms or legs:     Sudden onset of difficulty speaking or slurred speech:    Temporary loss of vision in one eye:     Problems with dizziness:         Gastrointestinal    Blood in stool:     Vomited blood:         Genitourinary    Burning when urinating:     Blood in urine:        Psychiatric    Major depression:         Hematologic    Bleeding problems:    Problems with blood clotting too easily:        Skin    Rashes or ulcers:        Constitutional    Fever or chills:      PHYSICAL EXAMINATION:  Vitals:   04/18/22 1430  BP: (!) 147/79  Pulse: 71  Resp: 20  Temp: 97.8 F (36.6 C)  SpO2: 97%  Weight: 229 lb (103.9 kg)  Height: '5\' 4"'  (1.626 m)     General:  WDWN in NAD; vital signs documented above Gait: Not observed HENT: WNL, normocephalic Pulmonary: normal non-labored breathing , without Rales, rhonchi,  wheezing Cardiac: regular HR,  Abdomen: soft, NT, no masses Skin: without rashes Vascular Exam/Pulses:  Right Left  Radial 2+ (normal) 2+ (normal)  Ulnar 2+ (normal) 2+ (normal)  Femoral    Popliteal    DP 2+ (normal) 2+ (normal)  PT 2+ (normal) 2+ (normal)   Extremities: without ischemic changes, without Gangrene , without cellulitis; without open wounds;  Musculoskeletal: no muscle wasting or atrophy  Neurologic: A&O X 3;  No focal weakness or paresthesias are detected Psychiatric:  The pt has Normal affect.   Non-Invasive Vascular Imaging:    Findings:   +--------------------+----------+-----------------+--------+  AVF                 PSV (cm/s)Flow Vol (mL/min)Comments  +--------------------+----------+-----------------+--------+  Native artery inflow   189           426                 +--------------------+----------+-----------------+--------+  AVF Anastomosis        800                               +--------------------+----------+-----------------+--------+      +------------+----------+-------------+----------+--------+  OUTFLOW VEINPSV (cm/s)Diameter (cm)Depth (cm)Describe  +------------+----------+-------------+----------+--------+  Shoulder       189        0.52        1.67             +------------+----------+-------------+----------+--------+  Prox UA        234        0.67        1.57             +------------+----------+-------------+----------+--------+  Mid UA          95        0.72        0.60             +------------+----------+-------------+----------+--------+  Dist UA        245        1.05        0.21             +------------+----------+-------------+----------+--------+  AC Fossa       537        0.53        0.52             +------------+----------+-------------+----------+--------+   ASSESSMENT/PLAN:  Sarah Kline is a 56 y.o. female who presents in follow-up status post right upper extremity brachiocephalic fistula creation.  The wound site is healed very nicely.  No signs or symptoms of steal syndrome.  The area of paresthesia is improving, therefore we will continue to monitor this.  Fistula duplex ultrasound was reviewed demonstrating insufficient flow volume, and appears quite deep.  She is currently not on dialysis therefore Bonita and I discussed follow-up in 1 month to allow for continued fistula maturation.  She is aware her fistula may require superficialization prior to access.  Broadus John, MD [facility] [phone]

## 2022-04-24 ENCOUNTER — Other Ambulatory Visit: Payer: Self-pay | Admitting: *Deleted

## 2022-04-24 DIAGNOSIS — N184 Chronic kidney disease, stage 4 (severe): Secondary | ICD-10-CM

## 2022-04-30 ENCOUNTER — Ambulatory Visit
Admission: RE | Admit: 2022-04-30 | Discharge: 2022-04-30 | Disposition: A | Discharge status: Home / Self Care | Payer: Medicare HMO | Source: Ambulatory Visit | Attending: Student | Admitting: Student

## 2022-04-30 DIAGNOSIS — Z1231 Encounter for screening mammogram for malignant neoplasm of breast: Secondary | ICD-10-CM

## 2022-05-02 ENCOUNTER — Other Ambulatory Visit: Payer: Self-pay | Admitting: Student

## 2022-05-02 DIAGNOSIS — R928 Other abnormal and inconclusive findings on diagnostic imaging of breast: Secondary | ICD-10-CM

## 2022-05-08 ENCOUNTER — Other Ambulatory Visit: Payer: Self-pay | Admitting: Student

## 2022-05-08 ENCOUNTER — Ambulatory Visit
Admission: RE | Admit: 2022-05-08 | Discharge: 2022-05-08 | Disposition: A | Discharge status: Home / Self Care | Payer: Medicare HMO | Source: Ambulatory Visit | Attending: Student | Admitting: Student

## 2022-05-08 DIAGNOSIS — R921 Mammographic calcification found on diagnostic imaging of breast: Secondary | ICD-10-CM

## 2022-05-08 DIAGNOSIS — R928 Other abnormal and inconclusive findings on diagnostic imaging of breast: Secondary | ICD-10-CM

## 2022-05-14 ENCOUNTER — Other Ambulatory Visit: Payer: Self-pay | Admitting: Diagnostic Radiology

## 2022-05-14 ENCOUNTER — Ambulatory Visit
Admission: RE | Admit: 2022-05-14 | Discharge: 2022-05-14 | Disposition: A | Discharge status: Home / Self Care | Payer: Medicare HMO | Source: Ambulatory Visit | Attending: Student | Admitting: Student

## 2022-05-14 DIAGNOSIS — R921 Mammographic calcification found on diagnostic imaging of breast: Secondary | ICD-10-CM

## 2022-05-16 ENCOUNTER — Telehealth: Payer: Self-pay | Admitting: Hematology and Oncology

## 2022-05-16 NOTE — Telephone Encounter (Signed)
Spoke with patient to confirm afternoon clinic appointment for 6/21, paper mailed to patient

## 2022-05-19 ENCOUNTER — Encounter: Payer: Self-pay | Admitting: *Deleted

## 2022-05-19 DIAGNOSIS — D0512 Intraductal carcinoma in situ of left breast: Secondary | ICD-10-CM | POA: Insufficient documentation

## 2022-05-19 NOTE — Progress Notes (Deleted)
  POST OPERATIVE OFFICE NOTE    CC:  F/u for surgery  HPI:  This is a 56 y.o. female who is s/p right BC AVF on 03/04/2022 by Dr. Virl Cagey   Pt states she does *** have pain/numbness in *** hand.    The pt *** on dialysis *** at *** location.   Allergies  Allergen Reactions   Bactrim [Sulfamethoxazole-Trimethoprim] Rash    Current Outpatient Medications  Medication Sig Dispense Refill   acetaminophen (TYLENOL) 500 MG tablet Take 1,000 mg by mouth every 6 (six) hours as needed for moderate pain.     amLODipine (NORVASC) 10 MG tablet TAKE 1 TABLET EVERY EVENING 90 tablet 3   ASPIRIN LOW DOSE 81 MG EC tablet TAKE 1 TABLET BY MOUTH EVERY DAY 90 tablet 3   atorvastatin (LIPITOR) 10 MG tablet Take 10 mg by mouth at bedtime.     Blood Glucose Monitoring Suppl (PRODIGY AUTOCODE BLOOD GLUCOSE) w/Device KIT 1 kit by Does not apply route as directed.     calcitRIOL (ROCALTROL) 0.25 MCG capsule TAKE 1 CAPSULE EVERY DAY 90 capsule 3   carvedilol (COREG) 12.5 MG tablet Take 12.5 mg by mouth 2 (two) times daily.     Dulaglutide (TRULICITY) 1.5 JP/3.6KK SOPN Inject 1.5 mg into the skin once a week. 12 pen 0   losartan (COZAAR) 50 MG tablet Take 50 mg by mouth at bedtime.     oxyCODONE-acetaminophen (PERCOCET) 5-325 MG tablet Take 1 tablet by mouth every 6 (six) hours as needed for severe pain. 6 tablet 0   sodium bicarbonate 650 MG tablet Take 1,300 mg by mouth 2 (two) times daily.  6   No current facility-administered medications for this visit.     ROS:  See HPI  Physical Exam:  ***  Incision:  *** Extremities:   There *** a palpable *** pulse.   Motor and sensory *** in tact.   There *** a thrill/bruit present.  The fistula/graft *** easily palpable   Dialysis Duplex on 05/26/2022 ***   Assessment/Plan:  This is a 56 y.o. female who is s/p: right BC AVF on 03/04/2022 by Dr. Virl Cagey    -the pt does *** have evidence of steal. -the fistula/graft can be used ***. -if pt has  tunneled catheter, this can be removed at the discretion of the dialysis center once the fistula/graft has been accessed to their satisfaction.  -discussed with pt that access does not last forever and will need intervention or even new access at some point.  -the pt will follow up ***   Leontine Locket, [facility] [phone]  Clinic MD:  Trula Slade

## 2022-05-20 ENCOUNTER — Ambulatory Visit: Payer: Self-pay | Admitting: Surgery

## 2022-05-20 DIAGNOSIS — D0512 Intraductal carcinoma in situ of left breast: Secondary | ICD-10-CM

## 2022-05-20 NOTE — Progress Notes (Signed)
Radiation Oncology         [phone] ________________________________  Initial Outpatient Consultation  Name: Sarah Kline MRN: [id number]  Date: 05/21/2022  DOB: 08-Jan-1966  [id number], Rachel Moulds, NP  Donnie Mesa, MD   REFERRING PHYSICIAN: Donnie Mesa, MD  DIAGNOSIS: No diagnosis found.  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 female who presented with a left breast abnormality on the following imaging: bilateral screening mammogram on the date of 04/30/22.  No symptoms, if any, were reported at that time.  Diagnostic left breast mammogram performed on 05/08/22 further revealed suspicious new grouped punctate and coarse heterogeneous calcifications within the upper-outer quadrant of the left breast, measuring 1.9 cm in extent. No evidence of axillary adenopathy was indicated, however targeted ultrasound was not performed.   Lateral left breast biopsy on date of 05/14/22 showed intermediate grade DCIS arising within a complex sclerosing lesion, with comedonecrosis and associated calcifications.  ER status: 100% positive; PR status 100% positive (both with strong staining intensity), Her2 not assessed. No lymph nodes were examined.   ***  PREVIOUS RADIATION THERAPY: No  PAST MEDICAL HISTORY:  has a past medical history of Anemia, Blind, Chronic kidney disease, Diabetes mellitus, Hypertension, and Sickle cell disease, type S ([place]).    PAST SURGICAL HISTORY: Past Surgical History:  Procedure Laterality Date   AV FISTULA PLACEMENT Right 03/04/2022   Procedure: RIGHT ARM BRACHIOCEPHALIC FISTULA CREATION;  Surgeon: Broadus John, MD;  Location: [facility];  Service: Vascular;  Laterality: Right;  PERIPHERAL NERVE BLOCK   EYE SURGERY Left    retinal detachment repair 2017    EYE SURGERY Left    ? type of surgery to repair blood vessels 2018   GANGLION CYST EXCISION     Left dorsal wrist   RETINAL DETACHMENT SURGERY Left    2017, [place]    FAMILY HISTORY: family history includes Asthma in her mother; Breast cancer (age of onset: 71) in her sister; COPD in her mother; Diabetes in her daughter and son; Hypertension in her mother; Stroke in her mother.  SOCIAL HISTORY:  reports that she has never smoked. She has never used smokeless tobacco. She reports current alcohol use of about 2.0 standard drinks of alcohol per week. She reports that she does not use drugs.  ALLERGIES: Bactrim [sulfamethoxazole-trimethoprim]  MEDICATIONS:  Current Outpatient Medications  Medication Sig Dispense Refill   acetaminophen (TYLENOL) 500 MG tablet Take 1,000 mg by mouth every 6 (six) hours as needed for moderate pain.     amLODipine (NORVASC) 10 MG tablet TAKE 1 TABLET EVERY EVENING 90 tablet 3   ASPIRIN LOW DOSE 81 MG EC tablet TAKE 1 TABLET BY MOUTH EVERY DAY 90 tablet 3   atorvastatin (LIPITOR) 10 MG tablet Take 10 mg by mouth at bedtime.     Blood Glucose Monitoring Suppl (PRODIGY AUTOCODE BLOOD GLUCOSE) w/Device KIT 1 kit by Does not apply route as directed.     calcitRIOL (ROCALTROL) 0.25 MCG capsule TAKE 1 CAPSULE EVERY DAY 90 capsule 3   carvedilol (COREG) 12.5 MG tablet Take 12.5 mg by mouth 2 (two) times daily.     Dulaglutide (TRULICITY) 1.5 AU/6.3FH SOPN Inject 1.5 mg into the skin once a week. 12 pen 0   losartan (COZAAR) 50 MG tablet Take 50  mg by mouth at bedtime.     oxyCODONE-acetaminophen (PERCOCET) 5-325 MG tablet Take 1 tablet by mouth every 6 (six) hours as needed for severe pain. 6 tablet 0   sodium bicarbonate 650 MG tablet Take 1,300 mg by mouth 2 (two) times daily.  6   No current facility-administered medications for this encounter.    REVIEW OF SYSTEMS: As above in HPI.   PHYSICAL EXAM:  vitals were not taken for this visit.   General: Alert  and oriented, in no acute distress HEENT: Head is normocephalic. Extraocular movements are intact. Oropharynx is clear. Neck: Neck is supple, no palpable cervical or supraclavicular lymphadenopathy. Heart: Regular in rate and rhythm with no murmurs, rubs, or gallops. Chest: Clear to auscultation bilaterally, with no rhonchi, wheezes, or rales. Abdomen: Soft, nontender, nondistended, with no rigidity or guarding. Extremities: No cyanosis or edema. Lymphatics: see Neck Exam Skin: No concerning lesions. Musculoskeletal: symmetric strength and muscle tone throughout. Neurologic: Cranial nerves II through XII are grossly intact. No obvious focalities. Speech is fluent. Coordination is intact. Psychiatric: Judgment and insight are intact. Affect is appropriate. Breasts: *** . No other palpable masses appreciated in the breasts or axillae *** .    ECOG = ***  0 - Asymptomatic (Fully active, able to carry on all predisease activities without restriction)  1 - Symptomatic but completely ambulatory (Restricted in physically strenuous activity but ambulatory and able to carry out work of a light or sedentary nature. For example, light housework, office work)  2 - Symptomatic, <50% in bed during the day (Ambulatory and capable of all self care but unable to carry out any work activities. Up and about more than 50% of waking hours)  3 - Symptomatic, >50% in bed, but not bedbound (Capable of only limited self-care, confined to bed or chair 50% or more of waking hours)  4 - Bedbound (Completely disabled. Cannot carry on any self-care. Totally confined to bed or chair)  5 - Death   Eustace Pen MM, Creech RH, Tormey DC, et al. [phone]). "Toxicity and response criteria of the [facility]". [place] Oncol. 5 (6): 649-55   LABORATORY DATA:  Lab Results  Component Value Date   WBC 8.2 10/14/2017   HGB 11.9 (L) 03/04/2022   HCT 35.0 (L) 03/04/2022   MCV 80 10/14/2017   PLT 423 (H)  10/14/2017   CMP     Component Value Date/Time   NA 143 03/04/2022 0614   NA 140 10/13/2019 1114   K 5.2 (H) 03/04/2022 0614   CL 110 03/04/2022 0614   CO2 21 10/13/2019 1114   GLUCOSE 128 (H) 03/04/2022 0614   BUN 49 (H) 03/04/2022 0614   BUN 43 (H) 10/13/2019 1114   CREATININE 3.90 (H) 03/04/2022 0614   CREATININE 1.73 (H) 02/13/2017 1008   CALCIUM 9.6 10/13/2019 1114   PROT 7.1 01/19/2017 1646   ALBUMIN 3.7 01/19/2017 1646   AST 16 01/19/2017 1646   ALT 14 01/19/2017 1646   ALKPHOS 86 01/19/2017 1646   BILITOT 1.1 01/19/2017 1646   GFRNONAA 20 (L) 10/13/2019 1114   GFRNONAA 33 (L) 02/13/2017 1008   GFRAA 23 (L) 10/13/2019 1114   GFRAA 39 (L) 02/13/2017 1008         RADIOGRAPHY: MM LT BREAST BX W LOC DEV 1ST LESION IMAGE BX SPEC STEREO GUIDE  Addendum Date: 05/19/2022   ADDENDUM REPORT: 05/19/2022 15:49 ADDENDUM: Pathology revealed GRADE II DUCTAL CARCINOMA IN SITU (DCIS) SOLID AND CRIBRIFORM  TYPE WITH COMEDONECROSIS AND ASSOCIATED CALCIFICATIONS, ARISING WITHIN A COMPLEX SCLEROSING LESION of the LEFT breast, outer, (coil clip). This was found to be concordant by Dr. Lovey Newcomer. Pathology results were discussed with the patient by telephone. The patient reported doing well after the biopsy with tenderness at the site. Post biopsy instructions and care were reviewed and questions were answered. The patient was encouraged to call [facility] for any additional concerns. My direct phone number was provided. The patient was referred to [facility] on May 21, 2022. Pathology results reported by Terie Purser, RN on 05/15/2022. Electronically Signed   By: Lovey Newcomer M.D.   On: 05/19/2022 15:49   Result Date: 05/19/2022 CLINICAL DATA:  Patient with indeterminate left breast calcifications. EXAM: LEFT BREAST STEREOTACTIC CORE NEEDLE BIOPSY COMPARISON:  None Available. FINDINGS: The  patient and I discussed the procedure of stereotactic-guided biopsy including benefits and alternatives. We discussed the high likelihood of a successful procedure. We discussed the risks of the procedure including infection, bleeding, tissue injury, clip migration, and inadequate sampling. Informed written consent was given. The usual time out protocol was performed immediately prior to the procedure. Using sterile technique and 1% Lidocaine as local anesthetic, under stereotactic guidance, a 9 gauge vacuum assisted device was used to perform core needle biopsy of calcifications within the outer left breast using a lateral approach. Specimen radiograph was performed showing calcifications. Specimens with calcifications are identified for pathology. Lesion quadrant: Upper outer quadrant At the conclusion of the procedure, coil shaped tissue marker clip was deployed into the biopsy cavity. Follow-up 2-view mammogram was performed and dictated separately. IMPRESSION: Stereotactic-guided biopsy of left breast calcifications. No apparent complications. Electronically Signed: By: Lovey Newcomer M.D. On: 05/14/2022 08:41  MM CLIP PLACEMENT LEFT  Result Date: 05/14/2022 CLINICAL DATA:  Status post stereo biopsy left breast calcifications. EXAM: 3D DIAGNOSTIC LEFT MAMMOGRAM POST STEREOTACTIC BIOPSY COMPARISON:  Previous exam(s). FINDINGS: 3D Mammographic images were obtained following stereotactic guided biopsy of left breast calcifications. The biopsy marking clip is located approximately 3 cm medial to the site of biopsied calcifications. IMPRESSION: Approximate 3 cm medial migration of the coil shaped marking clip. Final Assessment: Post Procedure Mammograms for Marker Placement Electronically Signed   By: Lovey Newcomer M.D.   On: 05/14/2022 08:48  MM Digital Diagnostic Unilat L  Result Date: 05/08/2022 CLINICAL DATA:  Patient returns today to evaluate LEFT breast calcifications identified on a recent screening  mammogram. EXAM: DIGITAL DIAGNOSTIC UNILATERAL LEFT MAMMOGRAM WITH CAD TECHNIQUE: Left digital diagnostic mammography was performed. Mammographic images were processed with CAD. COMPARISON:  Previous exams including recent screening mammogram dated 04/30/2022 ACR Breast Density Category b: There are scattered areas of fibroglandular density. FINDINGS: On today's additional diagnostic views, including magnification views, grouped punctate and coarse heterogeneous calcifications are confirmed within the upper-outer quadrant of the LEFT breast, measuring 1.9 cm extent. IMPRESSION: New grouped punctate and coarse heterogeneous calcifications within the upper-outer quadrant of the LEFT breast, measuring 1.9 cm extent. This is a suspicious finding for which stereotactic biopsy is recommended. RECOMMENDATION: Stereotactic biopsy for the LEFT breast calcifications. Stereotactic biopsy is scheduled on June 14th. I have discussed the findings and recommendations with the patient. If applicable, a reminder letter will be sent to the patient regarding the next appointment. BI-RADS CATEGORY  4: Suspicious. Electronically Signed   By: Franki Cabot M.D.   On: 05/08/2022 09:30  MM 3D SCREEN  BREAST BILATERAL  Result Date: 05/01/2022 CLINICAL DATA:  Screening. EXAM: DIGITAL SCREENING BILATERAL MAMMOGRAM WITH TOMOSYNTHESIS AND CAD TECHNIQUE: Bilateral screening digital craniocaudal and mediolateral oblique mammograms were obtained. Bilateral screening digital breast tomosynthesis was performed. The images were evaluated with computer-aided detection. COMPARISON:  Previous exam(s). ACR Breast Density Category b: There are scattered areas of fibroglandular density. FINDINGS: In the left breast, calcifications warrant further evaluation. In the right breast, no findings suspicious for malignancy. IMPRESSION: Further evaluation is suggested for calcifications in the left breast. RECOMMENDATION: Diagnostic mammogram of the left breast.  (Code:FI-L-30M) The patient will be contacted regarding the findings, and additional imaging will be scheduled. BI-RADS CATEGORY  0: Incomplete. Need additional imaging evaluation and/or prior mammograms for comparison. Electronically Signed   By: Fidela Salisbury M.D.   On: 05/01/2022 11:40      IMPRESSION/PLAN: ***   It was a pleasure meeting the patient today. We discussed the risks, benefits, and side effects of radiotherapy. I recommend radiotherapy to the *** to reduce her risk of locoregional recurrence by 2/3.  We discussed that radiation would take approximately *** weeks to complete and that I would give the patient a few weeks to heal following surgery before starting treatment planning. *** If chemotherapy were to be given, this would precede radiotherapy. We spoke about acute effects including skin irritation and fatigue as well as much less common late effects including internal organ injury or irritation. We spoke about the latest technology that is used to minimize the risk of late effects for patients undergoing radiotherapy to the breast or chest wall. No guarantees of treatment were given. The patient is enthusiastic about proceeding with treatment. I look forward to participating in the patient's care.  I will await her referral back to me for postoperative follow-up and eventual CT simulation/treatment planning.  On date of service, in total, I spent *** minutes on this encounter. Patient was seen in person.   __________________________________________   Eppie Gibson, MD  This document serves as a record of services personally performed by Eppie Gibson, MD. It was created on her behalf by Roney Mans, a trained medical scribe. The creation of this record is based on the scribe's personal observations and the provider's statements to them. This document has been checked and approved by the attending provider.

## 2022-05-20 NOTE — Progress Notes (Signed)
[facility] NOTE  Patient Care Team: Cipriano Mile, NP as PCP - General Mauro Kaufmann, RN as Oncology Nurse Navigator Rockwell Germany, RN as Oncology Nurse Navigator Donnie Mesa, MD as Consulting Physician (General Surgery) Nicholas Lose, MD as Consulting Physician (Hematology and Oncology) Eppie Gibson, MD as Attending Physician (Radiation Oncology)  CHIEF COMPLAINTS/PURPOSE OF CONSULTATION:  Newly diagnosed breast cancer  HISTORY OF PRESENTING ILLNESS:  Sarah Kline 56 y.o. female is here because of recent diagnosis of left breast cancer. Screening mammogram on 04/30/22 detected calcifications in the left breast. Diagnostic mammogram on 05/08/22 showed New grouped punctate and coarse heterogeneous calcifications within the upper-outer quadrant of the LEFT breast, measuring 1.9 cm extent. This is a suspicious finding for which stereotactic biopsy is recommended.  Biopsy on the date of 05/14/22 showed: ***. Prognostic indicators significant for ER ***; PR ***, Her2 status ***; Grade ***.    I reviewed her records extensively and collaborated the history with the patient.  SUMMARY OF ONCOLOGIC HISTORY: Oncology History   No history exists.     MEDICAL HISTORY:  Past Medical History:  Diagnosis Date   Anemia    Blind    Chronic kidney disease    Diabetes mellitus    Hypertension    Sickle cell disease, type S ([place])     SURGICAL HISTORY: Past Surgical History:  Procedure Laterality Date   AV FISTULA PLACEMENT Right 03/04/2022   Procedure: RIGHT ARM BRACHIOCEPHALIC FISTULA CREATION;  Surgeon: Broadus John, MD;  Location: [facility] OR;  Service: Vascular;  Laterality: Right;  PERIPHERAL NERVE BLOCK   EYE SURGERY Left    retinal detachment repair 2017   EYE SURGERY Left    ? type of surgery to repair blood vessels 2018   GANGLION CYST EXCISION     Left dorsal wrist   RETINAL DETACHMENT SURGERY Left    2017, [facility]    SOCIAL  HISTORY: Social History   Socioeconomic History   Marital status: Married    Spouse name: Not on file   Number of children: Not on file   Years of education: Not on file   Highest education level: Not on file  Occupational History   Not on file  Tobacco Use   Smoking status: Never   Smokeless tobacco: Never  Vaping Use   Vaping Use: Never used  Substance and Sexual Activity   Alcohol use: Yes    Alcohol/week: 2.0 standard drinks of alcohol    Types: 2 Shots of liquor per week    Comment: twice per month   Drug use: No    Comment: Very remote marijuana use, 20 years ago   Sexual activity: Not on file  Other Topics Concern   Not on file  Social History Narrative   Not on file   Social Determinants of Health   Financial Resource Strain: Not on file  Food Insecurity: Not on file  Transportation Needs: Not on file  Physical Activity: Not on file  Stress: Not on file  Social Connections: Not on file  Intimate Partner Violence: Not on file    FAMILY HISTORY: Family History  Problem Relation Age of Onset   Asthma Mother    COPD Mother    Hypertension Mother    Stroke Mother    Breast cancer Sister 19   Diabetes Daughter    Diabetes Son     ALLERGIES:  is allergic to bactrim [sulfamethoxazole-trimethoprim].  MEDICATIONS:  Current Outpatient Medications  Medication Sig Dispense Refill   acetaminophen (TYLENOL) 500 MG tablet Take 1,000 mg by mouth every 6 (six) hours as needed for moderate pain.     amLODipine (NORVASC) 10 MG tablet TAKE 1 TABLET EVERY EVENING 90 tablet 3   ASPIRIN LOW DOSE 81 MG EC tablet TAKE 1 TABLET BY MOUTH EVERY DAY 90 tablet 3   atorvastatin (LIPITOR) 10 MG tablet Take 10 mg by mouth at bedtime.     Blood Glucose Monitoring Suppl (PRODIGY AUTOCODE BLOOD GLUCOSE) w/Device KIT 1 kit by Does not apply route as directed.     calcitRIOL (ROCALTROL) 0.25 MCG capsule TAKE 1 CAPSULE EVERY DAY 90 capsule 3   carvedilol (COREG) 12.5 MG tablet Take  12.5 mg by mouth 2 (two) times daily.     Dulaglutide (TRULICITY) 1.5 TX/6.4WO SOPN Inject 1.5 mg into the skin once a week. 12 pen 0   losartan (COZAAR) 50 MG tablet Take 50 mg by mouth at bedtime.     oxyCODONE-acetaminophen (PERCOCET) 5-325 MG tablet Take 1 tablet by mouth every 6 (six) hours as needed for severe pain. 6 tablet 0   sodium bicarbonate 650 MG tablet Take 1,300 mg by mouth 2 (two) times daily.  6   No current facility-administered medications for this visit.    REVIEW OF SYSTEMS:   Constitutional: Denies fevers, chills or abnormal night sweats Eyes: Denies blurriness of vision, double vision or watery eyes Ears, nose, mouth, throat, and face: Denies mucositis or sore throat Respiratory: Denies cough, dyspnea or wheezes Cardiovascular: Denies palpitation, chest discomfort or lower extremity swelling Gastrointestinal:  Denies nausea, heartburn or change in bowel habits Skin: Denies abnormal skin rashes Lymphatics: Denies new lymphadenopathy or easy bruising Neurological:Denies numbness, tingling or new weaknesses Behavioral/Psych: Mood is stable, no new changes  Breast: *** Denies any palpable lumps or discharge All other systems were reviewed with the patient and are negative.  PHYSICAL EXAMINATION: ECOG PERFORMANCE STATUS: {CHL ONC ECOG [phone]}  There were no vitals filed for this visit. There were no vitals filed for this visit.  GENERAL:alert, no distress and comfortable SKIN: skin color, texture, turgor are normal, no rashes or significant lesions EYES: normal, conjunctiva are pink and non-injected, sclera clear OROPHARYNX:no exudate, no erythema and lips, buccal mucosa, and tongue normal  NECK: supple, thyroid normal size, non-tender, without nodularity LYMPH:  no palpable lymphadenopathy in the cervical, axillary or inguinal LUNGS: clear to auscultation and percussion with normal breathing effort HEART: regular rate & rhythm and no murmurs and no lower  extremity edema ABDOMEN:abdomen soft, non-tender and normal bowel sounds Musculoskeletal:no cyanosis of digits and no clubbing  PSYCH: alert & oriented x 3 with fluent speech NEURO: no focal motor/sensory deficits BREAST:*** No palpable nodules in breast. No palpable axillary or supraclavicular lymphadenopathy (exam performed in the presence of a chaperone)   LABORATORY DATA:  I have reviewed the data as listed Lab Results  Component Value Date   WBC 8.2 10/14/2017   HGB 11.9 (L) 03/04/2022   HCT 35.0 (L) 03/04/2022   MCV 80 10/14/2017   PLT 423 (H) 10/14/2017   Lab Results  Component Value Date   NA 143 03/04/2022   K 5.2 (H) 03/04/2022   CL 110 03/04/2022   CO2 21 10/13/2019    RADIOGRAPHIC STUDIES: I have personally reviewed the radiological reports and agreed with the findings in the report.  ASSESSMENT AND PLAN:  No problem-specific Assessment & Plan notes found for this encounter.   All questions were  answered. The patient knows to call the clinic with any problems, questions or concerns.    [place], CMA 05/20/22 I Gardiner Coins am scribing for Dr. Lindi Adie  ***

## 2022-05-21 ENCOUNTER — Ambulatory Visit
Admission: RE | Admit: 2022-05-21 | Discharge: 2022-05-21 | Disposition: A | Discharge status: Home / Self Care | Payer: Medicare HMO | Source: Ambulatory Visit | Attending: Radiation Oncology | Admitting: Radiation Oncology

## 2022-05-21 ENCOUNTER — Ambulatory Visit: Payer: Medicare HMO | Admitting: Physical Therapy

## 2022-05-21 ENCOUNTER — Encounter: Payer: Self-pay | Admitting: Hematology and Oncology

## 2022-05-21 ENCOUNTER — Encounter: Payer: Self-pay | Admitting: Genetic Counselor

## 2022-05-21 ENCOUNTER — Inpatient Hospital Stay: Payer: Medicare HMO

## 2022-05-21 ENCOUNTER — Inpatient Hospital Stay: Payer: Medicare HMO | Attending: Hematology and Oncology | Admitting: Hematology and Oncology

## 2022-05-21 ENCOUNTER — Encounter: Payer: Self-pay | Admitting: *Deleted

## 2022-05-21 ENCOUNTER — Other Ambulatory Visit: Payer: Self-pay

## 2022-05-21 ENCOUNTER — Ambulatory Visit: Payer: Self-pay | Admitting: Surgery

## 2022-05-21 ENCOUNTER — Ambulatory Visit: Payer: Medicare HMO | Admitting: Radiation Oncology

## 2022-05-21 ENCOUNTER — Encounter: Payer: Self-pay | Admitting: Radiation Oncology

## 2022-05-21 ENCOUNTER — Inpatient Hospital Stay: Payer: Medicare HMO | Admitting: Genetic Counselor

## 2022-05-21 DIAGNOSIS — N186 End stage renal disease: Secondary | ICD-10-CM | POA: Diagnosis not present

## 2022-05-21 DIAGNOSIS — D0512 Intraductal carcinoma in situ of left breast: Secondary | ICD-10-CM

## 2022-05-21 DIAGNOSIS — I129 Hypertensive chronic kidney disease with stage 1 through stage 4 chronic kidney disease, or unspecified chronic kidney disease: Secondary | ICD-10-CM | POA: Insufficient documentation

## 2022-05-21 DIAGNOSIS — Z833 Family history of diabetes mellitus: Secondary | ICD-10-CM | POA: Diagnosis not present

## 2022-05-21 DIAGNOSIS — Z79899 Other long term (current) drug therapy: Secondary | ICD-10-CM | POA: Diagnosis not present

## 2022-05-21 DIAGNOSIS — Z836 Family history of other diseases of the respiratory system: Secondary | ICD-10-CM | POA: Insufficient documentation

## 2022-05-21 DIAGNOSIS — Z8249 Family history of ischemic heart disease and other diseases of the circulatory system: Secondary | ICD-10-CM | POA: Insufficient documentation

## 2022-05-21 DIAGNOSIS — Z823 Family history of stroke: Secondary | ICD-10-CM | POA: Diagnosis not present

## 2022-05-21 DIAGNOSIS — Z881 Allergy status to other antibiotic agents status: Secondary | ICD-10-CM | POA: Insufficient documentation

## 2022-05-21 DIAGNOSIS — Z803 Family history of malignant neoplasm of breast: Secondary | ICD-10-CM | POA: Diagnosis not present

## 2022-05-21 LAB — CBC WITH DIFFERENTIAL (CANCER CENTER ONLY)
Abs Immature Granulocytes: 0.02 10*3/uL (ref 0.00–0.07)
Basophils Absolute: 0.1 10*3/uL (ref 0.0–0.1)
Basophils Relative: 1 %
Eosinophils Absolute: 0.2 10*3/uL (ref 0.0–0.5)
Eosinophils Relative: 4 %
HCT: 33.4 % — ABNORMAL LOW (ref 36.0–46.0)
Hemoglobin: 11.3 g/dL — ABNORMAL LOW (ref 12.0–15.0)
Immature Granulocytes: 0 %
Lymphocytes Relative: 20 %
Lymphs Abs: 1.1 10*3/uL (ref 0.7–4.0)
MCH: 29.4 pg (ref 26.0–34.0)
MCHC: 33.8 g/dL (ref 30.0–36.0)
MCV: 87 fL (ref 80.0–100.0)
Monocytes Absolute: 0.6 10*3/uL (ref 0.1–1.0)
Monocytes Relative: 10 %
Neutro Abs: 3.5 10*3/uL (ref 1.7–7.7)
Neutrophils Relative %: 65 %
Platelet Count: 269 10*3/uL (ref 150–400)
RBC: 3.84 MIL/uL — ABNORMAL LOW (ref 3.87–5.11)
RDW: 13.9 % (ref 11.5–15.5)
WBC Count: 5.5 10*3/uL (ref 4.0–10.5)
nRBC: 0 % (ref 0.0–0.2)

## 2022-05-21 LAB — CMP (CANCER CENTER ONLY)
ALT: 42 U/L (ref 0–44)
AST: 20 U/L (ref 15–41)
Albumin: 4.2 g/dL (ref 3.5–5.0)
Alkaline Phosphatase: 124 U/L (ref 38–126)
Anion gap: 8 (ref 5–15)
BUN: 62 mg/dL — ABNORMAL HIGH (ref 6–20)
CO2: 24 mmol/L (ref 22–32)
Calcium: 9.9 mg/dL (ref 8.9–10.3)
Chloride: 111 mmol/L (ref 98–111)
Creatinine: 3.61 mg/dL (ref 0.44–1.00)
GFR, Estimated: 14 mL/min — ABNORMAL LOW (ref >60)
Glucose, Bld: 102 mg/dL — ABNORMAL HIGH (ref 70–99)
Potassium: 4.8 mmol/L (ref 3.5–5.1)
Sodium: 143 mmol/L (ref 135–145)
Total Bilirubin: 1.1 mg/dL (ref 0.3–1.2)
Total Protein: 7.8 g/dL (ref 6.5–8.1)

## 2022-05-21 LAB — GENETIC SCREENING ORDER

## 2022-05-21 NOTE — Assessment & Plan Note (Signed)
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 It is considered a precancerous lesion. DCIS is classified as a 0. It is generally detected through mammograms as calcifications. We discussed the significance of grades and its impact on prognosis. We also discussed the importance of ER and PR receptors and their implications to adjuvant treatment options. Prognosis of DCIS dependence on grade, comedo necrosis. It is anticipated that if not treated, 20-30% of DCIS can develop into invasive breast cancer.  Recommendation: 1. Breast conserving surgery 2. Followed by adjuvant radiation therapy 3. Followed by antiestrogen therapy with tamoxifen 5 years  Tamoxifen counseling: We discussed the risks and benefits of tamoxifen. These include but not limited to insomnia, hot flashes, mood changes, vaginal dryness, and weight gain. Although rare, serious side effects including endometrial cancer, risk of blood clots were also discussed. We strongly believe that the benefits far outweigh the risks. Patient understands these risks and consented to starting treatment. Planned treatment duration is 5 years.  Return to clinic after surgery to discuss the final pathology report and come up with an adjuvant treatment plan.

## 2022-05-21 NOTE — Progress Notes (Signed)
CRITICAL VALUE STICKER  CRITICAL VALUE: Crt 3.61  RECEIVER (on-site recipient of call): Sarah Kline 05/21/22 at 1330   MD NOTIFIED: Nicholas Lose, MD  TIME OF NOTIFICATION: 05/21/22 at 1340  RESPONSE:  MD notified, verbalized understanding. No orders received at this time.

## 2022-05-21 NOTE — H&P (Signed)
Subjective    Chief Complaint: Breast Cancer   Breast [place] 05/21/22 - Myrtie Neither       History of Present Illness: Sarah Kline is a 56 y.o. female who is seen today in [facility] for evaluation of Breast Cancer .   This is a 56 year old female with multiple medical issues including diabetes, blindness, end-stage renal disease (not yet dialysis dependent) who presents after a recent screening mammogram revealed calcifications in the left upper outer quadrant.  She underwent further work-up which included biopsy.  This revealed ductal carcinoma in situ grade 2, ER/PR positive.  The area of calcifications measured 1.9 cm in diameter.  The patient has a family history of breast cancer in a sister.  She presents now to discuss treatment options.           Medical History: Past Medical History      Past Medical History:  Diagnosis Date   Chronic kidney disease     Diabetes mellitus without complication (CMS-HCC)     History of cancer     History of stroke     Hyperlipidemia     Hypertension             Patient Active Problem List  Diagnosis   Intraductal carcinoma in situ of left breast   Essential hypertension   Mixed hyperlipidemia   Type 2 diabetes mellitus (CMS-HCC)      Past Surgical History       Past Surgical History:  Procedure Laterality Date   .Eye Surgery Left     AV Fistula Placement Right          Allergies      Allergies  Allergen Reactions   Sulfamethoxazole-Trimethoprim Rash              Current Outpatient Medications on File Prior to Visit  Medication Sig Dispense Refill   atorvastatin (LIPITOR) 40 MG tablet Take 40 mg by mouth once daily       carvediloL (COREG) 25 MG tablet Take by mouth       losartan (COZAAR) 50 MG tablet Take 50 mg by mouth once daily       aspirin 81 MG EC tablet Take by mouth       TRULICITY 1.5 SU/1.1 mL subcutaneous pen injector Inject subcutaneously every 7 (seven) days        No  current facility-administered medications on file prior to visit.      Family History       Family History  Problem Relation Age of Onset   Stroke Mother     High blood pressure (Hypertension) Mother     Asthma Mother     Breast cancer Sister     Diabetes Son     Diabetes Daughter          Social History       Tobacco Use  Smoking Status Never  Smokeless Tobacco Never      Social History  Social History        Socioeconomic History   Marital status: Married  Tobacco Use   Smoking status: Never   Smokeless tobacco: Never  Vaping Use   Vaping Use: Never used  Substance and Sexual Activity   Alcohol use: Yes   Drug use: Never        Objective:        Physical Exam    Constitutional:  WDWN in NAD, conversant, no obvious deformities; lying in bed comfortably Eyes:  Blind, sclera anicteric; moist conjunctiva; no lid lag HENT:  Oral mucosa moist; good dentition  Neck:  No masses palpated, trachea midline; no thyromegaly Lungs:  CTA bilaterally; normal respiratory effort Breasts:  symmetric, no nipple changes; no palpable masses or lymphadenopathy on either side CV:  Regular rate and rhythm; no murmurs; extremities well-perfused with no edema Abd:  +bowel sounds, soft, non-tender, no palpable organomegaly; no palpable hernias Musc:  Unable to assess gait; no apparent clubbing or cyanosis in extremities Lymphatic:  No palpable cervical or axillary lymphadenopathy Skin:  Warm, dry; no sign of jaundice Psychiatric - alert and oriented x 4; calm mood and affect     Labs, Imaging and Diagnostic Testing:   Diagnosis Breast, left, needle core biopsy, lateral - DUCTAL CARCINOMA IN SITU (DCIS) SOLID AND CRIBRIFORM TYPE WITH COMEDONECROSIS AND ASSOCIATED CALCIFICATIONS, NUCLEAR GRADE 2 OF 3. - ARISING WITHIN A COMPLEX SCLEROSING LESION. IMMUNOHISTOCHEMICAL AND MORPHOMETRIC ANALYSIS PERFORMED MANUALLY Estrogen Receptor: 100%, POSITIVE, STRONG STAINING  INTENSITY Progesterone Receptor: 95%, POSITIVE, STRONG STAINING INTENSITY   CLINICAL DATA:  Patient returns today to evaluate LEFT breast calcifications identified on a recent screening mammogram.   EXAM: DIGITAL DIAGNOSTIC UNILATERAL LEFT MAMMOGRAM WITH CAD   TECHNIQUE: Left digital diagnostic mammography was performed. Mammographic images were processed with CAD.   COMPARISON:  Previous exams including recent screening mammogram dated 04/30/2022   ACR Breast Density Category b: There are scattered areas of fibroglandular density.   FINDINGS: On today's additional diagnostic views, including magnification views, grouped punctate and coarse heterogeneous calcifications are confirmed within the upper-outer quadrant of the LEFT breast, measuring 1.9 cm extent.   IMPRESSION: New grouped punctate and coarse heterogeneous calcifications within the upper-outer quadrant of the LEFT breast, measuring 1.9 cm extent. This is a suspicious finding for which stereotactic biopsy is recommended.   RECOMMENDATION: Stereotactic biopsy for the LEFT breast calcifications.   Stereotactic biopsy is scheduled on June 14th.   I have discussed the findings and recommendations with the patient. If applicable, a reminder letter will be sent to the patient regarding the next appointment.   BI-RADS CATEGORY  4: Suspicious.     Electronically Signed   By: Franki Cabot M.D.   On: 05/08/2022 09:30   Assessment and Plan:  Diagnoses and all orders for this visit:   Intraductal carcinoma in situ of left breast     We spent approximately 30 minutes with the patient and a total of 60 minutes overall coordinating her treatment plan.  The patient has significant medical comorbidities.  We recommend lumpectomy to try to achieve clear margins.  This may be followed by radiation as well as antiestrogens.   Left breast radioactive seed localized lumpectomy .The surgical procedure has been discussed  with the patient.  Potential risks, benefits, alternative treatments, and expected outcomes have been explained.  All of the patient's questions at this time have been answered.  The likelihood of reaching the patient's treatment goal is good.  The patient understand the proposed surgical procedure and wishes to proceed.   At the time of surgery, we will not plan to perform any type of lymph node biopsy as she has noninvasive disease.        Carlean Jews, MD  05/21/2022 2:51 PM

## 2022-05-21 NOTE — Progress Notes (Signed)
REFERRING PROVIDER: Nicholas Lose, MD [address] [place],  [place] [address]  PRIMARY PROVIDER:  Cipriano Mile, NP  PRIMARY REASON FOR VISIT:  1. Ductal carcinoma in situ (DCIS) of left breast   2. Family history of breast cancer     HISTORY OF PRESENT ILLNESS:   Sarah Kline, a 56 y.o. female, was seen for a [facility] cancer genetics consultation at the request of Dr. Lindi Adie due to a personal and family history of breast cancer.  Sarah Kline presents to clinic today to discuss the possibility of a hereditary predisposition to cancer, to discuss genetic testing, and to further clarify her future cancer risks, as well as potential cancer risks for family members.   In June 2023, at the age of 25, Sarah Kline was diagnosed with ductal carcinoma in situ of the left breast. The treatment plan is pending.      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  Menarche was at age 21.  First live birth at age 26.  Menopausal status: postmenopausal.  OCP use for approximately 0 years.  HRT use: 0 years.   Past Medical History:  Diagnosis Date   Anemia    Blind    Breast cancer ([place])    Chronic kidney disease    Diabetes mellitus    Hypertension    Sickle cell disease, type S ([place])     Past Surgical History:  Procedure Laterality Date   AV FISTULA PLACEMENT Right 03/04/2022   Procedure: RIGHT ARM BRACHIOCEPHALIC FISTULA  CREATION;  Surgeon: Broadus John, MD;  Location: [facility];  Service: Vascular;  Laterality: Right;  PERIPHERAL NERVE BLOCK   EYE SURGERY Left    retinal detachment repair 2017   EYE SURGERY Left    ? type of surgery to repair blood vessels 2018   GANGLION CYST EXCISION     Left dorsal wrist   RETINAL DETACHMENT SURGERY Left    2017, [facility]    FAMILY HISTORY:  We obtained a detailed, 4-generation family history.  Significant diagnoses are listed below: Family History  Problem Relation Age of Onset   Breast cancer Sister 63     Sarah Kline had limited information about paternal and maternal family histories. Sarah Kline is unaware of previous family history of genetic testing for hereditary cancer risks. There is no reported Ashkenazi Jewish ancestry. There is no known consanguinity.  GENETIC COUNSELING ASSESSMENT: Sarah Kline is a 57 y.o. female with a personal and family history of breast cancer which is somewhat suggestive of a hereditary cancer syndrome and predisposition to cancer given the presence of related cancers in the family and limited family history information . We, therefore, discussed and recommended the following at today's visit.   DISCUSSION: We discussed that 5 - 10% of cancer is hereditary.  Most cases of hereditary breast cancer are associated with mutations in BRCA1/2.  There are other genes that can  be associated with hereditary breast cancer syndromes.  We discussed that testing is beneficial for several reasons including knowing how to follow individuals for their cancer risks, identifying whether potential treatment options  would be beneficial, and understanding if other family members could be at risk for cancer and allowing them to undergo genetic testing.   We reviewed the characteristics, features and inheritance patterns of hereditary cancer syndromes. We also discussed genetic testing, including the appropriate family members to test, the process  of testing, insurance coverage and turn-around-time for results. We discussed the implications of a negative, positive, carrier and/or variant of uncertain significant result. We recommended Sarah Kline pursue genetic testing for a panel that includes genes associated with breast cancer.   We discussed with Sarah Kline that the personal and family history does not meet insurance or NCCN criteria for genetic testing However, we discussed that genetic testing is reasonable given that she does not have detailed information about family history.  We discussed availablity of self-pay price and patient pay asstistance programs.    PLAN:  Sarah Kline did not wish to pursue genetic testing at today's visit. We understand this decision and remain available to coordinate genetic testing at any time in the future. We, therefore, recommend Sarah Kline continue to follow the cancer screening guidelines given by her primary healthcare provider.  Lastly, we encouraged Sarah Kline to remain in contact with cancer genetics annually so that we can continuously update the family history and inform her of any changes in cancer genetics and testing that may be of benefit for this family.   Sarah Kline questions were answered to her satisfaction today. Our contact information was provided should additional questions or concerns arise. Thank you for the referral and allowing [place] to share in the care of your patient.   Kathline Banbury M. Joette Catching, [place], [facility] Genetic Counselor [email or web address] [phone]  The patient was seen for a total of 12 minutes in face-to-face genetic counseling.  Patient was accompanied by her daughter and husband. Drs. Lindi Adie and/or Burr Medico were available to discuss this case as needed.    _______________________________________________________________________ For Office Staff:  Number of people involved in session: 3 Was an Intern/ student involved with case: no

## 2022-05-22 ENCOUNTER — Encounter: Payer: Self-pay | Admitting: *Deleted

## 2022-05-22 ENCOUNTER — Telehealth: Payer: Self-pay | Admitting: *Deleted

## 2022-05-22 DIAGNOSIS — D0512 Intraductal carcinoma in situ of left breast: Secondary | ICD-10-CM

## 2022-05-22 NOTE — Progress Notes (Signed)
Spoke with staff at Dr. Fatima Sanger office, patient does not meet criteria for [facility] and will need to be moved to Main OR.

## 2022-05-22 NOTE — Telephone Encounter (Signed)
Spoke to pt concerning [place] from 6.21.23. Denies questions or concerns regarding dx or treatment care plan. Encourage pt to call with needs.

## 2022-05-23 ENCOUNTER — Other Ambulatory Visit: Payer: Self-pay | Admitting: Surgery

## 2022-05-23 DIAGNOSIS — D0512 Intraductal carcinoma in situ of left breast: Secondary | ICD-10-CM

## 2022-05-26 ENCOUNTER — Ambulatory Visit
Admission: RE | Admit: 2022-05-26 | Discharge: 2022-05-26 | Disposition: A | Discharge status: Home / Self Care | Payer: Medicare HMO | Source: Ambulatory Visit | Attending: Surgery | Admitting: Surgery

## 2022-05-26 ENCOUNTER — Telehealth: Payer: Self-pay | Admitting: Hematology and Oncology

## 2022-05-26 ENCOUNTER — Other Ambulatory Visit: Payer: Self-pay

## 2022-05-26 ENCOUNTER — Encounter (HOSPITAL_COMMUNITY): Payer: Self-pay | Admitting: Surgery

## 2022-05-26 ENCOUNTER — Encounter (HOSPITAL_COMMUNITY): Payer: Medicare HMO

## 2022-05-26 ENCOUNTER — Ambulatory Visit: Payer: Medicare HMO

## 2022-05-26 DIAGNOSIS — D0512 Intraductal carcinoma in situ of left breast: Secondary | ICD-10-CM

## 2022-05-26 NOTE — Anesthesia Preprocedure Evaluation (Addendum)
Anesthesia Evaluation  Patient identified by MRN, date of birth, ID band Patient awake    Reviewed: Allergy & Precautions, NPO status , Patient's Chart, lab work & pertinent test results, reviewed documented beta blocker date and time   Airway Mallampati: II  TM Distance: >3 FB Neck ROM: Full    Dental no notable dental hx. (+) Missing,    Pulmonary neg pulmonary ROS,    Pulmonary exam normal breath sounds clear to auscultation       Cardiovascular hypertension (160/79 in preop, per pt normally runs 120s-130s SBP), Pt. on medications and Pt. on home beta blockers Normal cardiovascular exam Rhythm:Regular Rate:Normal   Stress test 02/04/22 showed " Small fixed perfusion defect involving the distal-mid lateral wall segments, with small amount of peri-infarct ischemia."   Seen by cards- "Treatment is risk factor modification and medical treatment -Increase lipitor to 40 mg, coreg to 25 from 12.5 mg and consider d/c clonidine (she is not sure if she is on clonidine) -Continue losartan per nephrology"   Neuro/Psych negative neurological ROS  negative psych ROS   GI/Hepatic negative GI ROS, Neg liver ROS,   Endo/Other  diabetes, Well Controlled, Type 2Obesity BMI 39 FS 93 in preop  Renal/GU ESRFRenal diseaseCr 3.6 CKD 5 not yet on dialysis  negative genitourinary   Musculoskeletal negative musculoskeletal ROS (+)   Abdominal (+) + obese,   Peds  Hematology  (+) Blood dyscrasia, Sickle cell trait and anemia , Hb 11.3   Anesthesia Other Findings Blind  L breast ca  Reproductive/Obstetrics negative OB ROS                           Anesthesia Physical Anesthesia Plan  ASA: 4  Anesthesia Plan: General   Post-op Pain Management: Tylenol PO (pre-op)*   Induction: Intravenous  PONV Risk Score and Plan: 3 and Ondansetron, Dexamethasone and Treatment may vary due to age or medical condition  Airway  Management Planned: LMA  Additional Equipment: None  Intra-op Plan:   Post-operative Plan: Extubation in OR  Informed Consent: I have reviewed the patients History and Physical, chart, labs and discussed the procedure including the risks, benefits and alternatives for the proposed anesthesia with the patient or authorized representative who has indicated his/her understanding and acceptance.     Dental advisory given  Plan Discussed with: CRNA  Anesthesia Plan Comments: ( )       Anesthesia Quick Evaluation

## 2022-05-27 ENCOUNTER — Encounter (HOSPITAL_COMMUNITY)
Admission: RE | Disposition: A | Discharge status: Home / Self Care | Payer: Self-pay | Source: Home / Self Care | Attending: Surgery

## 2022-05-27 ENCOUNTER — Encounter (HOSPITAL_COMMUNITY): Payer: Self-pay | Admitting: Surgery

## 2022-05-27 ENCOUNTER — Ambulatory Visit (HOSPITAL_COMMUNITY): Payer: Medicare HMO | Admitting: Physician Assistant

## 2022-05-27 ENCOUNTER — Other Ambulatory Visit: Payer: Self-pay

## 2022-05-27 ENCOUNTER — Ambulatory Visit (HOSPITAL_BASED_OUTPATIENT_CLINIC_OR_DEPARTMENT_OTHER): Payer: Medicare HMO | Admitting: Physician Assistant

## 2022-05-27 ENCOUNTER — Ambulatory Visit
Admission: RE | Admit: 2022-05-27 | Discharge: 2022-05-27 | Disposition: A | Discharge status: Home / Self Care | Payer: Medicare HMO | Source: Ambulatory Visit | Attending: Surgery | Admitting: Surgery

## 2022-05-27 ENCOUNTER — Ambulatory Visit (HOSPITAL_COMMUNITY)
Admission: RE | Admit: 2022-05-27 | Discharge: 2022-05-27 | Disposition: A | Discharge status: Home / Self Care | Payer: Medicare HMO | Attending: Surgery | Admitting: Surgery

## 2022-05-27 DIAGNOSIS — E669 Obesity, unspecified: Secondary | ICD-10-CM | POA: Diagnosis not present

## 2022-05-27 DIAGNOSIS — D0512 Intraductal carcinoma in situ of left breast: Secondary | ICD-10-CM

## 2022-05-27 DIAGNOSIS — D649 Anemia, unspecified: Secondary | ICD-10-CM | POA: Diagnosis not present

## 2022-05-27 DIAGNOSIS — E1122 Type 2 diabetes mellitus with diabetic chronic kidney disease: Secondary | ICD-10-CM | POA: Diagnosis not present

## 2022-05-27 DIAGNOSIS — I120 Hypertensive chronic kidney disease with stage 5 chronic kidney disease or end stage renal disease: Secondary | ICD-10-CM | POA: Diagnosis not present

## 2022-05-27 DIAGNOSIS — H547 Unspecified visual loss: Secondary | ICD-10-CM | POA: Diagnosis not present

## 2022-05-27 DIAGNOSIS — Z6839 Body mass index (BMI) 39.0-39.9, adult: Secondary | ICD-10-CM | POA: Diagnosis not present

## 2022-05-27 DIAGNOSIS — N186 End stage renal disease: Secondary | ICD-10-CM

## 2022-05-27 DIAGNOSIS — D573 Sickle-cell trait: Secondary | ICD-10-CM | POA: Diagnosis not present

## 2022-05-27 DIAGNOSIS — D759 Disease of blood and blood-forming organs, unspecified: Secondary | ICD-10-CM | POA: Insufficient documentation

## 2022-05-27 DIAGNOSIS — Z803 Family history of malignant neoplasm of breast: Secondary | ICD-10-CM | POA: Insufficient documentation

## 2022-05-27 HISTORY — PX: BREAST LUMPECTOMY WITH RADIOACTIVE SEED LOCALIZATION: SHX6424

## 2022-05-27 LAB — GLUCOSE, CAPILLARY
Glucose-Capillary: 95 mg/dL (ref 70–99)
Glucose-Capillary: 106 mg/dL — ABNORMAL HIGH (ref 70–99)

## 2022-05-27 SURGERY — BREAST LUMPECTOMY WITH RADIOACTIVE SEED LOCALIZATION
Anesthesia: General | Site: Breast | Laterality: Left

## 2022-05-27 MED ORDER — ACETAMINOPHEN 500 MG PO TABS: 1000.0000 mg | ORAL_TABLET | ORAL | Status: AC

## 2022-05-27 MED ORDER — DEXAMETHASONE SODIUM PHOSPHATE 10 MG/ML IJ SOLN
INTRAMUSCULAR | Status: DC | PRN
  Administered 2022-05-27: 10 mg via INTRAVENOUS

## 2022-05-27 MED ORDER — OXYCODONE HCL 5 MG PO TABS: 5.0000 mg | ORAL_TABLET | Freq: Once | ORAL | Status: DC | PRN

## 2022-05-27 MED ORDER — CEFAZOLIN SODIUM-DEXTROSE 2-4 GM/100ML-% IV SOLN
INTRAVENOUS | Status: AC
  Filled 2022-05-27: qty 100

## 2022-05-27 MED ORDER — PROPOFOL 10 MG/ML IV BOLUS
INTRAVENOUS | Status: AC
  Filled 2022-05-27: qty 20

## 2022-05-27 MED ORDER — HYDROMORPHONE HCL 1 MG/ML IJ SOLN: 0.2500 mg | INTRAMUSCULAR | Status: DC | PRN

## 2022-05-27 MED ORDER — LACTATED RINGERS IV SOLN: INTRAVENOUS | Status: DC

## 2022-05-27 MED ORDER — 0.9 % SODIUM CHLORIDE (POUR BTL) OPTIME
TOPICAL | Status: DC | PRN
  Administered 2022-05-27: 1000 mL

## 2022-05-27 MED ORDER — CHLORHEXIDINE GLUCONATE CLOTH 2 % EX PADS: 6.0000 | MEDICATED_PAD | Freq: Once | CUTANEOUS | Status: DC

## 2022-05-27 MED ORDER — LIDOCAINE 2% (20 MG/ML) 5 ML SYRINGE
INTRAMUSCULAR | Status: AC
  Filled 2022-05-27: qty 5

## 2022-05-27 MED ORDER — ACETAMINOPHEN 500 MG PO TABS
ORAL_TABLET | ORAL | Status: AC
  Administered 2022-05-27: 1000 mg via ORAL
  Filled 2022-05-27: qty 2

## 2022-05-27 MED ORDER — ROCURONIUM BROMIDE 10 MG/ML (PF) SYRINGE
PREFILLED_SYRINGE | INTRAVENOUS | Status: AC
  Filled 2022-05-27: qty 10

## 2022-05-27 MED ORDER — ONDANSETRON HCL 4 MG/2ML IJ SOLN
INTRAMUSCULAR | Status: AC
  Filled 2022-05-27: qty 2

## 2022-05-27 MED ORDER — BUPIVACAINE-EPINEPHRINE 0.25% -1:200000 IJ SOLN
INTRAMUSCULAR | Status: DC | PRN
  Administered 2022-05-27: 10 mL

## 2022-05-27 MED ORDER — PHENYLEPHRINE 80 MCG/ML (10ML) SYRINGE FOR IV PUSH (FOR BLOOD PRESSURE SUPPORT)
PREFILLED_SYRINGE | INTRAVENOUS | Status: DC | PRN
  Administered 2022-05-27 (×2): 80 ug via INTRAVENOUS
  Administered 2022-05-27: 240 ug via INTRAVENOUS
  Administered 2022-05-27: 80 ug via INTRAVENOUS

## 2022-05-27 MED ORDER — ONDANSETRON HCL 4 MG/2ML IJ SOLN: 4.0000 mg | Freq: Once | INTRAMUSCULAR | Status: DC | PRN

## 2022-05-27 MED ORDER — EPHEDRINE SULFATE-NACL 50-0.9 MG/10ML-% IV SOSY
PREFILLED_SYRINGE | INTRAVENOUS | Status: DC | PRN
  Administered 2022-05-27 (×2): 10 mg via INTRAVENOUS

## 2022-05-27 MED ORDER — CHLORHEXIDINE GLUCONATE 0.12 % MT SOLN
15.0000 mL | Freq: Once | OROMUCOSAL | Status: AC
Start: 2022-05-27 — End: 2022-05-27

## 2022-05-27 MED ORDER — CEFAZOLIN SODIUM-DEXTROSE 2-4 GM/100ML-% IV SOLN
2.0000 g | INTRAVENOUS | Status: AC
  Administered 2022-05-27: 2 g via INTRAVENOUS

## 2022-05-27 MED ORDER — FENTANYL CITRATE (PF) 250 MCG/5ML IJ SOLN
INTRAMUSCULAR | Status: AC
  Filled 2022-05-27: qty 5

## 2022-05-27 MED ORDER — ONDANSETRON HCL 4 MG/2ML IJ SOLN
INTRAMUSCULAR | Status: DC | PRN
  Administered 2022-05-27: 4 mg via INTRAVENOUS

## 2022-05-27 MED ORDER — FENTANYL CITRATE (PF) 250 MCG/5ML IJ SOLN
INTRAMUSCULAR | Status: DC | PRN
  Administered 2022-05-27: 50 ug via INTRAVENOUS

## 2022-05-27 MED ORDER — DEXAMETHASONE SODIUM PHOSPHATE 10 MG/ML IJ SOLN
INTRAMUSCULAR | Status: AC
  Filled 2022-05-27: qty 1

## 2022-05-27 MED ORDER — LIDOCAINE 2% (20 MG/ML) 5 ML SYRINGE
INTRAMUSCULAR | Status: DC | PRN
  Administered 2022-05-27: 60 mg via INTRAVENOUS

## 2022-05-27 MED ORDER — ORAL CARE MOUTH RINSE: 15.0000 mL | Freq: Once | OROMUCOSAL | Status: AC

## 2022-05-27 MED ORDER — MIDAZOLAM HCL 2 MG/2ML IJ SOLN
INTRAMUSCULAR | Status: DC | PRN
  Administered 2022-05-27: 2 mg via INTRAVENOUS

## 2022-05-27 MED ORDER — OXYCODONE HCL 5 MG/5ML PO SOLN: 5.0000 mg | Freq: Once | ORAL | Status: DC | PRN

## 2022-05-27 MED ORDER — MIDAZOLAM HCL 2 MG/2ML IJ SOLN
INTRAMUSCULAR | Status: AC
  Filled 2022-05-27: qty 2

## 2022-05-27 MED ORDER — BUPIVACAINE-EPINEPHRINE (PF) 0.25% -1:200000 IJ SOLN
INTRAMUSCULAR | Status: AC
Start: 2022-05-27 — End: ?
  Filled 2022-05-27: qty 30

## 2022-05-27 MED ORDER — CHLORHEXIDINE GLUCONATE 0.12 % MT SOLN
OROMUCOSAL | Status: AC
  Administered 2022-05-27: 15 mL via OROMUCOSAL
  Filled 2022-05-27: qty 15

## 2022-05-27 MED ORDER — PROPOFOL 10 MG/ML IV BOLUS
INTRAVENOUS | Status: DC | PRN
  Administered 2022-05-27: 150 mg via INTRAVENOUS

## 2022-05-27 MED ORDER — AMISULPRIDE (ANTIEMETIC) 5 MG/2ML IV SOLN: 10.0000 mg | Freq: Once | INTRAVENOUS | Status: DC | PRN

## 2022-05-27 SURGICAL SUPPLY — 43 items
APPLIER CLIP 9.375 MED OPEN (MISCELLANEOUS)
BAG COUNTER SPONGE SURGICOUNT (BAG) ×2 IMPLANT
BENZOIN TINCTURE PRP APPL 2/3 (GAUZE/BANDAGES/DRESSINGS) ×2 IMPLANT
BINDER BREAST XLRG (GAUZE/BANDAGES/DRESSINGS) ×1 IMPLANT
CANISTER SUCT 3000ML PPV (MISCELLANEOUS) ×2 IMPLANT
CHLORAPREP W/TINT 26 (MISCELLANEOUS) ×2 IMPLANT
CLIP APPLIE 9.375 MED OPEN (MISCELLANEOUS) IMPLANT
COVER PROBE W GEL 5X96 (DRAPES) ×2 IMPLANT
COVER SURGICAL LIGHT HANDLE (MISCELLANEOUS) ×2 IMPLANT
DEVICE DUBIN SPECIMEN MAMMOGRA (MISCELLANEOUS) ×2 IMPLANT
DRAPE CHEST BREAST 15X10 FENES (DRAPES) ×2 IMPLANT
DRSG TEGADERM 4X4.75 (GAUZE/BANDAGES/DRESSINGS) ×2 IMPLANT
ELECT CAUTERY BLADE 6.4 (BLADE) ×2 IMPLANT
ELECT REM PT RETURN 9FT ADLT (ELECTROSURGICAL) ×2
ELECTRODE REM PT RTRN 9FT ADLT (ELECTROSURGICAL) ×1 IMPLANT
GAUZE SPONGE 2X2 8PLY STRL LF (GAUZE/BANDAGES/DRESSINGS) ×1 IMPLANT
GLOVE BIO SURGEON STRL SZ7 (GLOVE) ×2 IMPLANT
GLOVE BIOGEL PI IND STRL 7.5 (GLOVE) ×1 IMPLANT
GLOVE BIOGEL PI INDICATOR 7.5 (GLOVE) ×1
GOWN STRL REUS W/ TWL LRG LVL3 (GOWN DISPOSABLE) ×2 IMPLANT
GOWN STRL REUS W/TWL LRG LVL3 (GOWN DISPOSABLE) ×4
ILLUMINATOR WAVEGUIDE N/F (MISCELLANEOUS) IMPLANT
KIT BASIN OR (CUSTOM PROCEDURE TRAY) ×2 IMPLANT
KIT MARKER MARGIN INK (KITS) ×2 IMPLANT
LIGHT WAVEGUIDE WIDE FLAT (MISCELLANEOUS) IMPLANT
NDL HYPO 25GX1X1/2 BEV (NEEDLE) ×1 IMPLANT
NEEDLE HYPO 25GX1X1/2 BEV (NEEDLE) ×2 IMPLANT
NS IRRIG 1000ML POUR BTL (IV SOLUTION) ×2 IMPLANT
PACK GENERAL/GYN (CUSTOM PROCEDURE TRAY) ×2 IMPLANT
PAD ABD 8X10 STRL (GAUZE/BANDAGES/DRESSINGS) ×1 IMPLANT
SPONGE GAUZE 2X2 8PLY STRL LF (GAUZE/BANDAGES/DRESSINGS) ×1 IMPLANT
SPONGE GAUZE 2X2 STER 10/PKG (GAUZE/BANDAGES/DRESSINGS) ×1
SPONGE T-LAP 4X18 ~~LOC~~+RFID (SPONGE) ×2 IMPLANT
STRIP CLOSURE SKIN 1/2X4 (GAUZE/BANDAGES/DRESSINGS) ×2 IMPLANT
STRIP CLOSURE SKIN 1/4X4 (GAUZE/BANDAGES/DRESSINGS) ×1 IMPLANT
SUT MNCRL AB 4-0 PS2 18 (SUTURE) ×2 IMPLANT
SUT SILK 2 0 SH (SUTURE) IMPLANT
SUT VIC AB 3-0 SH 27 (SUTURE) ×2
SUT VIC AB 3-0 SH 27X BRD (SUTURE) ×1 IMPLANT
SUT VIC AB 3-0 SH 8-18 (SUTURE) ×1 IMPLANT
SYR CONTROL 10ML LL (SYRINGE) ×2 IMPLANT
TOWEL GREEN STERILE (TOWEL DISPOSABLE) ×2 IMPLANT
TOWEL GREEN STERILE FF (TOWEL DISPOSABLE) ×2 IMPLANT

## 2022-05-27 NOTE — Anesthesia Postprocedure Evaluation (Signed)
Anesthesia Post Note  Patient: Sarah Kline  Procedure(s) Performed: LEFT BREAST LUMPECTOMY WITH RADIOACTIVE SEED LOCALIZATION (Left: Breast)     Patient location during evaluation: PACU Anesthesia Type: General Level of consciousness: awake and alert, oriented and patient cooperative Pain management: pain level controlled Vital Signs Assessment: post-procedure vital signs reviewed and stable Respiratory status: spontaneous breathing, nonlabored ventilation and respiratory function stable Cardiovascular status: blood pressure returned to baseline and stable Postop Assessment: no apparent nausea or vomiting Anesthetic complications: no   No notable events documented.  Last Vitals:  Vitals:   05/27/22 0624 05/27/22 0840  BP: (!) 160/79 124/63  Pulse:  74  Resp:  15  Temp:    SpO2:  90%    Last Pain:  Vitals:   05/27/22 0617  TempSrc:   PainSc: 0-No pain                 Lannie Fields

## 2022-05-28 ENCOUNTER — Encounter (HOSPITAL_COMMUNITY): Payer: Self-pay | Admitting: Surgery

## 2022-05-28 ENCOUNTER — Encounter: Payer: Self-pay | Admitting: General Practice

## 2022-05-28 NOTE — Progress Notes (Signed)
[place] Psychosocial Distress Screening Spiritual Care  Met with Sarah Kline by phone following Breast Multidisciplinary [facility] to introduce [place] team/resources, reviewing distress screen per protocol.  The patient scored a 2 on the Psychosocial Distress Thermometer which indicates mild distress. Also assessed for distress and other psychosocial needs.    05/28/2022  ONCBCN DISTRESS SCREENING   Screening Type Initial Screening   Distress experienced in past week (1-10) 2   Emotional problem type Nervousness/Anxiety;Adjusting to illness;Boredom   Referral to support programs Yes     Sarah Kline notes that she is feeling more settled emotionally now that she is moving into her treatment process and isn't feeling any pain from her procedure.  Encouraged [employer] account executive Care/[facility] support programming for connection and meaningful activity to help with boredom.  Follow up needed: No. Sarah Kline reports no other needs at this time, but is aware of ongoing chaplain availability, should needs arise or circumstances change.   [place], [place], [facility] Pager [phone] Voicemail [phone]

## 2022-05-29 LAB — SURGICAL PATHOLOGY

## 2022-06-04 ENCOUNTER — Encounter: Payer: Self-pay | Admitting: *Deleted

## 2022-06-05 ENCOUNTER — Encounter (HOSPITAL_COMMUNITY): Payer: Self-pay

## 2022-06-10 ENCOUNTER — Inpatient Hospital Stay: Payer: Medicare HMO | Attending: Hematology and Oncology | Admitting: Hematology and Oncology

## 2022-06-10 ENCOUNTER — Other Ambulatory Visit: Payer: Self-pay

## 2022-06-10 DIAGNOSIS — Z79899 Other long term (current) drug therapy: Secondary | ICD-10-CM | POA: Diagnosis not present

## 2022-06-10 DIAGNOSIS — D0512 Intraductal carcinoma in situ of left breast: Secondary | ICD-10-CM | POA: Insufficient documentation

## 2022-06-10 NOTE — Progress Notes (Signed)
Patient Care Team: Cipriano Mile, NP as PCP - General Mauro Kaufmann, RN as Oncology Nurse Navigator Rockwell Germany, RN as Oncology Nurse Navigator Donnie Mesa, MD as Consulting Physician (General Surgery) Nicholas Lose, MD as Consulting Physician (Hematology and Oncology) Eppie Gibson, MD as Attending Physician (Radiation Oncology)  DIAGNOSIS:  Encounter Diagnosis  Name Primary?   Ductal carcinoma in situ (DCIS) of left breast     SUMMARY OF ONCOLOGIC HISTORY: Oncology History  Ductal carcinoma in situ (DCIS) of left breast  05/14/2022 Initial Diagnosis   Screening mammogram detected left breast calcifications measuring 1.9 cm, biopsy revealed grade 2 DCIS and CSL, ER 100%, PR 95%   05/21/2022 Cancer Staging   Staging form: Breast, AJCC 8th Edition - Clinical stage from 05/21/2022: Stage 0 (cTis (DCIS), cN0, cM0, ER+, PR+) - Signed by Nicholas Lose, MD on 05/21/2022 Stage prefix: Initial diagnosis Nuclear grade: G2   05/27/2022 Surgery   Left lumpectomy: DCIS intermediate grade with focal necrosis and calcifications spanning a fibrotic area 4.6 cm in a discontinuous manner, no invasive cancer, ER 100%, PR 95%     CHIEF COMPLIANT: Follow-up after surgery  INTERVAL HISTORY: Sarah Kline is a 56 y.o. female is here because of recent diagnosis of left breast cancer. She presents to the clinic today for a follow-up after surgery to review pathology report. She tolerated surgery very well. Denies pain or discomfort. Complains of a bruise on the opposite side from surgery.   ALLERGIES:  has no active allergies.  MEDICATIONS:  Current Outpatient Medications  Medication Sig Dispense Refill   acetaminophen (TYLENOL) 500 MG tablet Take 1,000 mg by mouth every 6 (six) hours as needed for moderate pain.     amLODipine (NORVASC) 10 MG tablet TAKE 1 TABLET EVERY EVENING 90 tablet 3   atorvastatin (LIPITOR) 40 MG tablet Take 40 mg by mouth at bedtime.     Blood Glucose  Monitoring Suppl (PRODIGY AUTOCODE BLOOD GLUCOSE) w/Device KIT 1 kit by Does not apply route as directed.     calcitRIOL (ROCALTROL) 0.25 MCG capsule TAKE 1 CAPSULE EVERY DAY 90 capsule 3   carvedilol (COREG) 25 MG tablet Take 25 mg by mouth 2 (two) times daily.     Dulaglutide (TRULICITY) 1.5 MY/1.1ZN SOPN Inject 1.5 mg into the skin once a week. 12 pen 0   losartan (COZAAR) 50 MG tablet Take 50 mg by mouth at bedtime.     sodium bicarbonate 650 MG tablet Take 1,300 mg by mouth 2 (two) times daily.  6   No current facility-administered medications for this visit.    PHYSICAL EXAMINATION: ECOG PERFORMANCE STATUS: 1 - Symptomatic but completely ambulatory  Vitals:   06/10/22 1528  BP: (!) 156/71  Pulse: 86  Resp: 18  Temp: 97.8 F (36.6 C)  SpO2: 93%   Filed Weights   06/10/22 1528  Weight: 237 lb 8 oz (107.7 kg)     LABORATORY DATA:  I have reviewed the data as listed    Latest Ref Rng & Units 05/21/2022   12:29 PM 03/04/2022    6:14 AM 10/13/2019   11:14 AM  CMP  Glucose 70 - 99 mg/dL 102  128  188   BUN 6 - 20 mg/dL 62  49  43   Creatinine 0.44 - 1.00 mg/dL 3.61  3.90  2.59   Sodium 135 - 145 mmol/L 143  143  140   Potassium 3.5 - 5.1 mmol/L 4.8  5.2  4.9  Chloride 98 - 111 mmol/L 111  110  103   CO2 22 - 32 mmol/L 24   21   Calcium 8.9 - 10.3 mg/dL 9.9   9.6   Total Protein 6.5 - 8.1 g/dL 7.8     Total Bilirubin 0.3 - 1.2 mg/dL 1.1     Alkaline Phos 38 - 126 U/L 124     AST 15 - 41 U/L 20     ALT 0 - 44 U/L 42       Lab Results  Component Value Date   WBC 5.5 05/21/2022   HGB 11.3 (L) 05/21/2022   HCT 33.4 (L) 05/21/2022   MCV 87.0 05/21/2022   PLT 269 05/21/2022   NEUTROABS 3.5 05/21/2022    ASSESSMENT & PLAN:  Ductal carcinoma in situ (DCIS) of left breast 05/27/2022:Left lumpectomy: DCIS intermediate grade with focal necrosis and calcifications spanning a fibrotic area 4.6 cm in a discontinuous manner, no invasive cancer, margins negative, ER 100%, PR  95%  Pathology counseling: I discussed the final pathology report of the patient provided  a copy of this report. I discussed the margins.  We also discussed the final staging along with previously performed ER/PR testing.  Treatment plan: 1. adjuvant radiation therapy 2. Followed by antiestrogen therapy with tamoxifen 5 years  Patient is visually impaired. Return to clinic after radiation is complete     No orders of the defined types were placed in this encounter.  The patient has a good understanding of the overall plan. she agrees with it. she will call with any problems that may develop before the next visit here. Total time spent: 30 mins including face to face time and time spent for planning, charting and co-ordination of care   Harriette Ohara, MD 06/10/22    I Gardiner Coins am scribing for Dr. Lindi Adie  I have reviewed the above documentation for accuracy and completeness, and I agree with the above.

## 2022-06-10 NOTE — Assessment & Plan Note (Signed)
05/27/2022:Left lumpectomy: DCIS intermediate grade with focal necrosis and calcifications spanning a fibrotic area 4.6 cm in a discontinuous manner, no invasive cancer, margins negative, ER 100%, PR 95%  Pathology counseling: I discussed the final pathology report of the patient provided  a copy of this report. I discussed the margins.  We also discussed the final staging along with previously performed ER/PR testing.  Treatment plan: 1. adjuvant radiation therapy 2. Followed by antiestrogen therapy with tamoxifen 5 years  Return to clinic after radiation is complete

## 2022-06-23 NOTE — Progress Notes (Signed)
Radiation Oncology         [phone] ________________________________  Name: Sarah Kline MRN: [id number]  Date: 06/24/2022  DOB: Feb 07, 1966  Follow-Up Visit Note  Outpatient  CC: Cipriano Mile, NP  Nicholas Lose, MD  Diagnosis:   No diagnosis found.   S/p lumpectomy: Left Breast, Intermediate grade DCIS, ER+ / PR+ / Her2 not assessed   Cancer Staging  Ductal carcinoma in situ (DCIS) of left breast Staging form: Breast, AJCC 8th Edition - Clinical stage from 05/21/2022: Stage 0 (cTis (DCIS), cN0, cM0, ER+, PR+) - Signed by Nicholas Lose, MD on 05/21/2022  CHIEF COMPLAINT: Here to discuss management of left breast DCIS  Narrative:  The patient returns today for follow-up.     Since breast clinic consultation date of 05/21/22, the patient opted to proceed with a left breast lumpectomy without nodal biopsies on 05/27/22 under the care of Dr. Georgette Dover.  Pathology from the procedure revealed: tumor spanning a fibrotic area of approximately 4.6 cm; histology of intermediate grade DCIS with focal necrosis and calcifications; all margins negative for DCIS; margin status to in situ disease not indicated on path; no lymph nodes were examined;  ER status: 100% positive and PR status 95% positive, both with strong staining intensity; Her2 not assessed.  The patient has met with Dr. Lindi Adie and will proceed with tamoxifen x 5 years following XRT.   Symptomatically, the patient reports: ***        ALLERGIES:  has no active allergies.  Meds: Current Outpatient Medications  Medication Sig Dispense Refill   acetaminophen (TYLENOL) 500 MG tablet Take 1,000 mg by mouth every 6 (six) hours as needed for moderate pain.     amLODipine (NORVASC) 10 MG tablet TAKE 1 TABLET EVERY EVENING 90 tablet 3   atorvastatin (LIPITOR) 40 MG tablet Take 40 mg by mouth at bedtime.     Blood Glucose Monitoring Suppl (PRODIGY AUTOCODE BLOOD GLUCOSE) w/Device KIT 1 kit by Does not apply route as directed.      calcitRIOL (ROCALTROL) 0.25 MCG capsule TAKE 1 CAPSULE EVERY DAY 90 capsule 3   carvedilol (COREG) 25 MG tablet Take 25 mg by mouth 2 (two) times daily.     Dulaglutide (TRULICITY) 1.5 NW/2.9FA SOPN Inject 1.5 mg into the skin once a week. 12 pen 0   losartan (COZAAR) 50 MG tablet Take 50 mg by mouth at bedtime.     sodium bicarbonate 650 MG tablet Take 1,300 mg by mouth 2 (two) times daily.  6   No current facility-administered medications for this encounter.    Physical Findings:  vitals were not taken for this visit. .     General: Alert and oriented, in no acute distress HEENT: Head is normocephalic. Extraocular movements are intact. Oropharynx is clear. Neck: Neck is supple, no palpable cervical or supraclavicular lymphadenopathy. Heart: Regular in rate and rhythm with no murmurs, rubs, or gallops. Chest: Clear to auscultation bilaterally, with no rhonchi, wheezes, or rales. Abdomen: Soft, nontender, nondistended, with no rigidity or guarding. Extremities: No cyanosis or edema. Lymphatics: see Neck Exam Musculoskeletal: symmetric strength and muscle tone throughout. Neurologic: No obvious focalities. Speech is fluent.  Psychiatric: Judgment and insight are intact. Affect is appropriate. Breast exam reveals ***  Lab Findings: Lab Results  Component Value Date   WBC 5.5 05/21/2022   HGB 11.3 (L) 05/21/2022   HCT 33.4 (L) 05/21/2022   MCV 87.0 05/21/2022   PLT 269 05/21/2022    '@LASTCHEMISTRY' @  Radiographic Findings: MM  Breast Surgical Specimen  Result Date: 05/27/2022 CLINICAL DATA:  Post lumpectomy specimen radiograph EXAM: SPECIMEN RADIOGRAPH OF THE LEFT BREAST COMPARISON:  None Available. FINDINGS: Status post excision of the left breast. The radioactive seed and biopsy marker clip are present, completely intact, and were marked for pathology. IMPRESSION: Specimen radiograph of the left breast. Electronically Signed   By: Audie Pinto M.D.   On: 05/27/2022  08:42  MM LT RADIOACTIVE SEED LOC MAMMO GUIDE  Result Date: 05/26/2022 CLINICAL DATA:  Patient with LEFT breast DCIS scheduled for lumpectomy requiring preoperative radioactive seed localization. Postprocedure mammogram obtained after a stereotactic biopsy on 05/14/2022 stated that the coil shaped biopsy marking clip was located 3 cm medial to the biopsy site. EXAM: MAMMOGRAPHIC GUIDED RADIOACTIVE SEED LOCALIZATION OF THE LEFT BREAST COMPARISON:  Previous exams including stereotactic biopsy dated 05/14/2022 FINDINGS: Patient presents for radioactive seed localization prior to . I met with the patient and we discussed the procedure of seed localization including benefits and alternatives. We discussed the high likelihood of a successful procedure. We discussed the risks of the procedure including infection, bleeding, tissue injury and further surgery. We discussed the low dose of radioactivity involved in the procedure. Informed, written consent was given. The usual time-out protocol was performed immediately prior to the procedure. Using mammographic guidance, sterile technique, 1% lidocaine and an I-125 radioactive seed, the portion of the outer LEFT breast located 3 cm lateral to the coil shaped biopsy clip was localized using a lateral approach. The follow-up mammogram images confirm the seed in the expected location and were marked for Dr. Georgette Dover. Follow-up survey of the patient confirms presence of the radioactive seed. Order number of I-125 seed:  [id number]. Total activity:  6.301 millicuries reference Date: 04/09/2022 The patient tolerated the procedure well and was released from the [facility]. She was given instructions regarding seed removal. IMPRESSION: Radioactive seed localization left breast. Per recommendations/description on postprocedure diagnostic mammogram report of 05/14/2022, the radioactive seed is correctly positioned 3 cm lateral to the coil shaped clip. No apparent complications.  Electronically Signed   By: Franki Cabot M.D.   On: 05/26/2022 15:43   Impression/Plan: We discussed adjuvant radiotherapy today.  I recommend *** in order to ***.  I reviewed the logistics, benefits, risks, and potential side effects of this treatment in detail. Risks may include but not necessary be limited to acute and late injury tissue in the radiation fields such as skin irritation (change in color/pigmentation, itching, dryness, pain, peeling). She may experience fatigue. We also discussed possible risk of long term cosmetic changes or scar tissue. There is also a smaller risk for lung toxicity, ***cardiac toxicity, ***brachial plexopathy, ***lymphedema, ***musculoskeletal changes, ***rib fragility or ***induction of a second malignancy, ***late chronic non-healing soft tissue wound.    The patient asked good questions which I answered to her satisfaction. She is enthusiastic about proceeding with treatment. A consent form has been *** signed and placed in her chart.  A total of *** medically necessary complex treatment devices will be fabricated and supervised by me: *** fields with MLCs for custom blocks to protect heart, and lungs;  and, a Vac-lok. MORE COMPLEX DEVICES MAY BE MADE IN DOSIMETRY FOR FIELD IN FIELD BEAMS FOR DOSE HOMOGENEITY.  I have requested : 3D Simulation which is medically necessary to give adequate dose to at risk tissues while sparing lungs and heart.  I have requested a DVH of the following structures: lungs, heart, *** lumpectomy cavity.    The  patient will receive *** Gy in *** fractions to the *** with *** fields.  This will be *** followed by a boost.  On date of service, in total, I spent *** minutes on this encounter. Patient was seen in person.  _____________________________________   Eppie Gibson, MD  This document serves as a record of services personally performed by Eppie Gibson, MD. It was created on her behalf by Roney Mans, a trained medical scribe.  The creation of this record is based on the scribe's personal observations and the provider's statements to them. This document has been checked and approved by the attending provider.

## 2022-06-23 NOTE — Progress Notes (Signed)
Location of Breast Cancer:  Ductal carcinoma in situ (DCIS) of left breast  Histology per Pathology Report:  05/27/2022 FINAL MICROSCOPIC DIAGNOSIS:  A. BREAST, LEFT, LUMPECTOMY:  - Ductal carcinoma in situ, intermediate grade with focal necrosis and calcifications, spanning a fibrotic area of about 4.6 cm in a discontinuous manner  - No evidence of invasive carcinoma  - Fibrocystic change with adenosis and usual ductal hyperplasia  - Biopsy site changes   Receptor Status: ER(100%), PR (95%)  Did patient present with symptoms (if so, please note symptoms) or was this found on screening mammography?: Diagnostic left breast mammogram performed on 05/08/22 further revealed suspicious new grouped punctate and coarse heterogeneous calcifications within the upper-outer quadrant of the left breast, measuring 1.9 cm in extent. No evidence of axillary adenopathy was indicated  Past/Anticipated interventions by surgeon, if any:  05/27/2022 --Dr. Donnie Mesa Left radioactive seed localized lumpectomy  Past/Anticipated interventions by medical oncology, if any:  Under care of Dr. Nicholas Lose 06/10/2022 --Treatment plan: 1. adjuvant radiation therapy 2. Followed by antiestrogen therapy with tamoxifen 5 years --Patient is visually impaired. --Return to clinic after radiation is complete  Lymphedema issues, if any:  Denies    Pain issues, if any: Reports mild occasional pain to left breast (denies having to take OTC pain medication).    SAFETY ISSUES: Prior radiation? No Pacemaker/ICD? No Possible current pregnancy? No--postmenopausal Is the patient on methotrexate? No  Current Complaints / other details:  Patient is legally blind

## 2022-06-24 ENCOUNTER — Other Ambulatory Visit: Payer: Self-pay

## 2022-06-24 ENCOUNTER — Ambulatory Visit
Admission: RE | Admit: 2022-06-24 | Discharge: 2022-06-24 | Disposition: A | Discharge status: Home / Self Care | Payer: Medicare HMO | Source: Ambulatory Visit | Attending: Radiation Oncology | Admitting: Radiation Oncology

## 2022-06-24 ENCOUNTER — Encounter: Payer: Self-pay | Admitting: Radiation Oncology

## 2022-06-24 VITALS — BP 134/61 | HR 63 | Temp 97.8°F | Resp 18 | Ht 64.0 in | Wt 238.1 lb

## 2022-06-24 DIAGNOSIS — Z79899 Other long term (current) drug therapy: Secondary | ICD-10-CM | POA: Insufficient documentation

## 2022-06-24 DIAGNOSIS — D0512 Intraductal carcinoma in situ of left breast: Secondary | ICD-10-CM

## 2022-06-24 DIAGNOSIS — Z170 Estrogen receptor positive status [ER+]: Secondary | ICD-10-CM | POA: Insufficient documentation

## 2022-06-27 DIAGNOSIS — D0512 Intraductal carcinoma in situ of left breast: Secondary | ICD-10-CM | POA: Diagnosis not present

## 2022-07-01 ENCOUNTER — Encounter: Payer: Self-pay | Admitting: *Deleted

## 2022-07-01 DIAGNOSIS — Z923 Personal history of irradiation: Secondary | ICD-10-CM

## 2022-07-01 HISTORY — DX: Personal history of irradiation: Z92.3

## 2022-07-09 ENCOUNTER — Other Ambulatory Visit: Payer: Self-pay

## 2022-07-09 ENCOUNTER — Ambulatory Visit
Admission: RE | Admit: 2022-07-09 | Discharge: 2022-07-09 | Disposition: A | Discharge status: Home / Self Care | Payer: Medicare HMO | Source: Ambulatory Visit | Attending: Radiation Oncology | Admitting: Radiation Oncology

## 2022-07-09 DIAGNOSIS — D0512 Intraductal carcinoma in situ of left breast: Secondary | ICD-10-CM | POA: Insufficient documentation

## 2022-07-09 LAB — RAD ONC ARIA SESSION SUMMARY
Course Elapsed Days: 0
Plan Fractions Treated to Date: 1
Plan Prescribed Dose Per Fraction: 2.67 Gy
Plan Total Fractions Prescribed: 15
Plan Total Prescribed Dose: 40.05 Gy
Reference Point Dosage Given to Date: 2.67 Gy
Reference Point Session Dosage Given: 2.67 Gy
Session Number: 1

## 2022-07-10 ENCOUNTER — Other Ambulatory Visit: Payer: Self-pay

## 2022-07-10 ENCOUNTER — Ambulatory Visit
Admission: RE | Admit: 2022-07-10 | Discharge: 2022-07-10 | Disposition: A | Discharge status: Home / Self Care | Payer: Medicare HMO | Source: Ambulatory Visit | Attending: Radiation Oncology | Admitting: Radiation Oncology

## 2022-07-10 DIAGNOSIS — D0512 Intraductal carcinoma in situ of left breast: Secondary | ICD-10-CM | POA: Diagnosis not present

## 2022-07-10 LAB — RAD ONC ARIA SESSION SUMMARY
Course Elapsed Days: 1
Plan Fractions Treated to Date: 2
Plan Prescribed Dose Per Fraction: 2.67 Gy
Plan Total Fractions Prescribed: 15
Plan Total Prescribed Dose: 40.05 Gy
Reference Point Dosage Given to Date: 5.34 Gy
Reference Point Session Dosage Given: 2.67 Gy
Session Number: 2

## 2022-07-11 ENCOUNTER — Other Ambulatory Visit: Payer: Self-pay

## 2022-07-11 ENCOUNTER — Ambulatory Visit
Admission: RE | Admit: 2022-07-11 | Discharge: 2022-07-11 | Disposition: A | Discharge status: Home / Self Care | Payer: Medicare HMO | Source: Ambulatory Visit | Attending: Radiation Oncology | Admitting: Radiation Oncology

## 2022-07-11 DIAGNOSIS — D0512 Intraductal carcinoma in situ of left breast: Secondary | ICD-10-CM | POA: Diagnosis not present

## 2022-07-11 LAB — RAD ONC ARIA SESSION SUMMARY
Course Elapsed Days: 2
Plan Fractions Treated to Date: 3
Plan Prescribed Dose Per Fraction: 2.67 Gy
Plan Total Fractions Prescribed: 15
Plan Total Prescribed Dose: 40.05 Gy
Reference Point Dosage Given to Date: 8.01 Gy
Reference Point Session Dosage Given: 2.67 Gy
Session Number: 3

## 2022-07-14 ENCOUNTER — Ambulatory Visit
Admission: RE | Admit: 2022-07-14 | Discharge: 2022-07-14 | Disposition: A | Discharge status: Home / Self Care | Payer: Medicare HMO | Source: Ambulatory Visit | Attending: Radiation Oncology | Admitting: Radiation Oncology

## 2022-07-14 ENCOUNTER — Ambulatory Visit: Payer: Medicare HMO

## 2022-07-14 ENCOUNTER — Other Ambulatory Visit: Payer: Self-pay

## 2022-07-14 DIAGNOSIS — D0512 Intraductal carcinoma in situ of left breast: Secondary | ICD-10-CM | POA: Diagnosis not present

## 2022-07-14 LAB — RAD ONC ARIA SESSION SUMMARY
Course Elapsed Days: 5
Plan Fractions Treated to Date: 4
Plan Prescribed Dose Per Fraction: 2.67 Gy
Plan Total Fractions Prescribed: 15
Plan Total Prescribed Dose: 40.05 Gy
Reference Point Dosage Given to Date: 10.68 Gy
Reference Point Session Dosage Given: 2.67 Gy
Session Number: 4

## 2022-07-14 MED ORDER — RADIAPLEXRX EX GEL: Freq: Two times a day (BID) | CUTANEOUS | Status: DC

## 2022-07-14 MED ORDER — ALRA NON-METALLIC DEODORANT (RAD-ONC)
1.0000 | Freq: Once | TOPICAL | Status: AC
  Administered 2022-07-14: 1 via TOPICAL

## 2022-07-14 NOTE — Progress Notes (Signed)

## 2022-07-15 ENCOUNTER — Ambulatory Visit
Admission: RE | Admit: 2022-07-15 | Discharge: 2022-07-15 | Disposition: A | Discharge status: Home / Self Care | Payer: Medicare HMO | Source: Ambulatory Visit | Attending: Radiation Oncology | Admitting: Radiation Oncology

## 2022-07-15 ENCOUNTER — Other Ambulatory Visit: Payer: Self-pay

## 2022-07-15 DIAGNOSIS — D0512 Intraductal carcinoma in situ of left breast: Secondary | ICD-10-CM | POA: Diagnosis not present

## 2022-07-15 LAB — RAD ONC ARIA SESSION SUMMARY
Course Elapsed Days: 6
Plan Fractions Treated to Date: 5
Plan Prescribed Dose Per Fraction: 2.67 Gy
Plan Total Fractions Prescribed: 15
Plan Total Prescribed Dose: 40.05 Gy
Reference Point Dosage Given to Date: 13.35 Gy
Reference Point Session Dosage Given: 2.67 Gy
Session Number: 5

## 2022-07-16 ENCOUNTER — Ambulatory Visit
Admission: RE | Admit: 2022-07-16 | Discharge: 2022-07-16 | Disposition: A | Discharge status: Home / Self Care | Payer: Medicare HMO | Source: Ambulatory Visit | Attending: Radiation Oncology | Admitting: Radiation Oncology

## 2022-07-16 ENCOUNTER — Other Ambulatory Visit: Payer: Self-pay

## 2022-07-16 DIAGNOSIS — D0512 Intraductal carcinoma in situ of left breast: Secondary | ICD-10-CM | POA: Diagnosis not present

## 2022-07-16 LAB — RAD ONC ARIA SESSION SUMMARY
Course Elapsed Days: 7
Plan Fractions Treated to Date: 6
Plan Prescribed Dose Per Fraction: 2.67 Gy
Plan Total Fractions Prescribed: 15
Plan Total Prescribed Dose: 40.05 Gy
Reference Point Dosage Given to Date: 16.02 Gy
Reference Point Session Dosage Given: 2.67 Gy
Session Number: 6

## 2022-07-17 ENCOUNTER — Other Ambulatory Visit: Payer: Self-pay

## 2022-07-17 ENCOUNTER — Ambulatory Visit
Admission: RE | Admit: 2022-07-17 | Discharge: 2022-07-17 | Disposition: A | Discharge status: Home / Self Care | Payer: Medicare HMO | Source: Ambulatory Visit | Attending: Radiation Oncology | Admitting: Radiation Oncology

## 2022-07-17 DIAGNOSIS — D0512 Intraductal carcinoma in situ of left breast: Secondary | ICD-10-CM | POA: Diagnosis not present

## 2022-07-17 LAB — RAD ONC ARIA SESSION SUMMARY
Course Elapsed Days: 8
Plan Fractions Treated to Date: 7
Plan Prescribed Dose Per Fraction: 2.67 Gy
Plan Total Fractions Prescribed: 15
Plan Total Prescribed Dose: 40.05 Gy
Reference Point Dosage Given to Date: 18.69 Gy
Reference Point Session Dosage Given: 2.67 Gy
Session Number: 7

## 2022-07-18 ENCOUNTER — Other Ambulatory Visit: Payer: Self-pay

## 2022-07-18 ENCOUNTER — Ambulatory Visit
Admission: RE | Admit: 2022-07-18 | Discharge: 2022-07-18 | Disposition: A | Discharge status: Home / Self Care | Payer: Medicare HMO | Source: Ambulatory Visit | Attending: Radiation Oncology | Admitting: Radiation Oncology

## 2022-07-18 DIAGNOSIS — D0512 Intraductal carcinoma in situ of left breast: Secondary | ICD-10-CM | POA: Diagnosis not present

## 2022-07-18 LAB — RAD ONC ARIA SESSION SUMMARY
Course Elapsed Days: 9
Plan Fractions Treated to Date: 8
Plan Prescribed Dose Per Fraction: 2.67 Gy
Plan Total Fractions Prescribed: 15
Plan Total Prescribed Dose: 40.05 Gy
Reference Point Dosage Given to Date: 21.36 Gy
Reference Point Session Dosage Given: 2.67 Gy
Session Number: 8

## 2022-07-20 NOTE — Progress Notes (Signed)
Patient Care Team: Cipriano Mile, NP as PCP - General Mauro Kaufmann, RN as Oncology Nurse Navigator Rockwell Germany, RN as Oncology Nurse Navigator Donnie Mesa, MD as Consulting Physician (General Surgery) Nicholas Lose, MD as Consulting Physician (Hematology and Oncology) Eppie Gibson, MD as Attending Physician (Radiation Oncology)  DIAGNOSIS: No diagnosis found.  SUMMARY OF ONCOLOGIC HISTORY: Oncology History  Ductal carcinoma in situ (DCIS) of left breast  05/14/2022 Initial Diagnosis   Screening mammogram detected left breast calcifications measuring 1.9 cm, biopsy revealed grade 2 DCIS and CSL, ER 100%, PR 95%   05/21/2022 Cancer Staging   Staging form: Breast, AJCC 8th Edition - Clinical stage from 05/21/2022: Stage 0 (cTis (DCIS), cN0, cM0, ER+, PR+) - Signed by Nicholas Lose, MD on 05/21/2022 Stage prefix: Initial diagnosis Nuclear grade: G2   05/27/2022 Surgery   Left lumpectomy: DCIS intermediate grade with focal necrosis and calcifications spanning a fibrotic area 4.6 cm in a discontinuous manner, no invasive cancer, ER 100%, PR 95%     CHIEF COMPLIANT: Follow-up after radiation  INTERVAL HISTORY: Sarah Kline is a 56 y.o. female is here because of recent diagnosis of left breast cancer. She presents to the clinic today for a follow-up after radiation.   ALLERGIES:  has no active allergies.  MEDICATIONS:  Current Outpatient Medications  Medication Sig Dispense Refill   acetaminophen (TYLENOL) 500 MG tablet Take 1,000 mg by mouth every 6 (six) hours as needed for moderate pain.     amLODipine (NORVASC) 10 MG tablet TAKE 1 TABLET EVERY EVENING 90 tablet 3   atorvastatin (LIPITOR) 40 MG tablet Take 40 mg by mouth at bedtime.     Blood Glucose Monitoring Suppl (PRODIGY AUTOCODE BLOOD GLUCOSE) w/Device KIT 1 kit by Does not apply route as directed.     calcitRIOL (ROCALTROL) 0.25 MCG capsule TAKE 1 CAPSULE EVERY DAY 90 capsule 3   carvedilol (COREG) 25 MG  tablet Take 25 mg by mouth 2 (two) times daily.     Dulaglutide (TRULICITY) 1.5 ZG/0.1VC SOPN Inject 1.5 mg into the skin once a week. 12 pen 0   losartan (COZAAR) 50 MG tablet Take 50 mg by mouth at bedtime.     sodium bicarbonate 650 MG tablet Take 1,300 mg by mouth 2 (two) times daily.  6   No current facility-administered medications for this visit.    PHYSICAL EXAMINATION: ECOG PERFORMANCE STATUS: {CHL ONC ECOG PS:[id number]}  There were no vitals filed for this visit. There were no vitals filed for this visit.  BREAST:*** No palpable masses or nodules in either right or left breasts. No palpable axillary supraclavicular or infraclavicular adenopathy no breast tenderness or nipple discharge. (exam performed in the presence of a chaperone)  LABORATORY DATA:  I have reviewed the data as listed    Latest Ref Rng & Units 05/21/2022   12:29 PM 03/04/2022    6:14 AM 10/13/2019   11:14 AM  CMP  Glucose 70 - 99 mg/dL 102  128  188   BUN 6 - 20 mg/dL 62  49  43   Creatinine 0.44 - 1.00 mg/dL 3.61  3.90  2.59   Sodium 135 - 145 mmol/L 143  143  140   Potassium 3.5 - 5.1 mmol/L 4.8  5.2  4.9   Chloride 98 - 111 mmol/L 111  110  103   CO2 22 - 32 mmol/L 24   21   Calcium 8.9 - 10.3 mg/dL 9.9   9.6  Total Protein 6.5 - 8.1 g/dL 7.8     Total Bilirubin 0.3 - 1.2 mg/dL 1.1     Alkaline Phos 38 - 126 U/L 124     AST 15 - 41 U/L 20     ALT 0 - 44 U/L 42       Lab Results  Component Value Date   WBC 5.5 05/21/2022   HGB 11.3 (L) 05/21/2022   HCT 33.4 (L) 05/21/2022   MCV 87.0 05/21/2022   PLT 269 05/21/2022   NEUTROABS 3.5 05/21/2022    ASSESSMENT & PLAN:  No problem-specific Assessment & Plan notes found for this encounter.    No orders of the defined types were placed in this encounter.  The patient has a good understanding of the overall plan. she agrees with it. she will call with any problems that may develop before the next visit here. Total time spent: 30 mins  including face to face time and time spent for planning, charting and co-ordination of care   Suzzette Righter, [place] 07/20/22    I Gardiner Coins am scribing for Dr. Lindi Adie  ***

## 2022-07-21 ENCOUNTER — Other Ambulatory Visit: Payer: Self-pay

## 2022-07-21 ENCOUNTER — Ambulatory Visit
Admission: RE | Admit: 2022-07-21 | Discharge: 2022-07-21 | Disposition: A | Discharge status: Home / Self Care | Payer: Medicare HMO | Source: Ambulatory Visit | Attending: Radiation Oncology | Admitting: Radiation Oncology

## 2022-07-21 ENCOUNTER — Ambulatory Visit: Payer: Medicare HMO

## 2022-07-21 DIAGNOSIS — D0512 Intraductal carcinoma in situ of left breast: Secondary | ICD-10-CM

## 2022-07-21 LAB — RAD ONC ARIA SESSION SUMMARY
Course Elapsed Days: 12
Plan Fractions Treated to Date: 9
Plan Prescribed Dose Per Fraction: 2.67 Gy
Plan Total Fractions Prescribed: 15
Plan Total Prescribed Dose: 40.05 Gy
Reference Point Dosage Given to Date: 24.03 Gy
Reference Point Session Dosage Given: 2.67 Gy
Session Number: 9

## 2022-07-21 MED ORDER — RADIAPLEXRX EX GEL: Freq: Once | CUTANEOUS | Status: AC

## 2022-07-22 ENCOUNTER — Inpatient Hospital Stay: Payer: Medicare HMO | Attending: Hematology and Oncology | Admitting: Hematology and Oncology

## 2022-07-22 ENCOUNTER — Ambulatory Visit
Admission: RE | Admit: 2022-07-22 | Discharge: 2022-07-22 | Disposition: A | Discharge status: Home / Self Care | Payer: Medicare HMO | Source: Ambulatory Visit | Attending: Radiation Oncology | Admitting: Radiation Oncology

## 2022-07-22 ENCOUNTER — Other Ambulatory Visit: Payer: Self-pay

## 2022-07-22 DIAGNOSIS — D0512 Intraductal carcinoma in situ of left breast: Secondary | ICD-10-CM | POA: Insufficient documentation

## 2022-07-22 DIAGNOSIS — Z79899 Other long term (current) drug therapy: Secondary | ICD-10-CM | POA: Insufficient documentation

## 2022-07-22 DIAGNOSIS — Z510 Encounter for antineoplastic radiation therapy: Secondary | ICD-10-CM | POA: Diagnosis present

## 2022-07-22 LAB — RAD ONC ARIA SESSION SUMMARY
Course Elapsed Days: 13
Plan Fractions Treated to Date: 10
Plan Prescribed Dose Per Fraction: 2.67 Gy
Plan Total Fractions Prescribed: 15
Plan Total Prescribed Dose: 40.05 Gy
Reference Point Dosage Given to Date: 26.7 Gy
Reference Point Session Dosage Given: 2.67 Gy
Session Number: 10

## 2022-07-22 MED ORDER — TAMOXIFEN CITRATE 10 MG PO TABS: 10.0000 mg | ORAL_TABLET | Freq: Every day | ORAL | 3 refills | Status: DC

## 2022-07-22 NOTE — Assessment & Plan Note (Signed)
05/27/2022:Left lumpectomy: DCIS intermediate grade with focal necrosis and calcifications spanning a fibrotic area 4.6 cm in a discontinuous manner, no invasive cancer, margins negative, ER 100%, PR 95%  Treatment plan: 1. adjuvant radiation therapy 07/10/2022-07/30/2022 2. Followed by antiestrogen therapy with tamoxifen 5 years  Patient is visually impaired. Tamoxifen counseling: We discussed the risks and benefits of tamoxifen. These include but not limited to insomnia, hot flashes, mood changes, vaginal dryness, and weight gain. Although rare, serious side effects including endometrial cancer, risk of blood clots were also discussed. We strongly believe that the benefits far outweigh the risks. Patient understands these risks and consented to starting treatment. Planned treatment duration is 5 years.  Once radiation is complete she will start tamoxifen 10 mg daily.  We can adjust dosage as appropriate.  There is data suggesting that even at 5 mg tamoxifen is effective.  (TAM-01 trial)  Return to clinic in 3 months for survivorship care plan visit

## 2022-07-23 ENCOUNTER — Other Ambulatory Visit: Payer: Self-pay

## 2022-07-23 ENCOUNTER — Ambulatory Visit
Admission: RE | Admit: 2022-07-23 | Discharge: 2022-07-23 | Disposition: A | Discharge status: Home / Self Care | Payer: Medicare HMO | Source: Ambulatory Visit | Attending: Radiation Oncology | Admitting: Radiation Oncology

## 2022-07-23 DIAGNOSIS — D0512 Intraductal carcinoma in situ of left breast: Secondary | ICD-10-CM | POA: Diagnosis not present

## 2022-07-23 LAB — RAD ONC ARIA SESSION SUMMARY
Course Elapsed Days: 14
Plan Fractions Treated to Date: 11
Plan Prescribed Dose Per Fraction: 2.67 Gy
Plan Total Fractions Prescribed: 15
Plan Total Prescribed Dose: 40.05 Gy
Reference Point Dosage Given to Date: 29.37 Gy
Reference Point Session Dosage Given: 2.67 Gy
Session Number: 11

## 2022-07-24 ENCOUNTER — Other Ambulatory Visit: Payer: Self-pay

## 2022-07-24 ENCOUNTER — Ambulatory Visit
Admission: RE | Admit: 2022-07-24 | Discharge: 2022-07-24 | Disposition: A | Discharge status: Home / Self Care | Payer: Medicare HMO | Source: Ambulatory Visit | Attending: Radiation Oncology | Admitting: Radiation Oncology

## 2022-07-24 DIAGNOSIS — D0512 Intraductal carcinoma in situ of left breast: Secondary | ICD-10-CM | POA: Diagnosis not present

## 2022-07-24 LAB — RAD ONC ARIA SESSION SUMMARY
Course Elapsed Days: 15
Plan Fractions Treated to Date: 12
Plan Prescribed Dose Per Fraction: 2.67 Gy
Plan Total Fractions Prescribed: 15
Plan Total Prescribed Dose: 40.05 Gy
Reference Point Dosage Given to Date: 32.04 Gy
Reference Point Session Dosage Given: 2.67 Gy
Session Number: 12

## 2022-07-25 ENCOUNTER — Other Ambulatory Visit: Payer: Self-pay

## 2022-07-25 ENCOUNTER — Ambulatory Visit
Admission: RE | Admit: 2022-07-25 | Discharge: 2022-07-25 | Disposition: A | Discharge status: Home / Self Care | Payer: Medicare HMO | Source: Ambulatory Visit | Attending: Radiation Oncology | Admitting: Radiation Oncology

## 2022-07-25 DIAGNOSIS — D0512 Intraductal carcinoma in situ of left breast: Secondary | ICD-10-CM | POA: Diagnosis not present

## 2022-07-25 LAB — RAD ONC ARIA SESSION SUMMARY
Course Elapsed Days: 16
Plan Fractions Treated to Date: 13
Plan Prescribed Dose Per Fraction: 2.67 Gy
Plan Total Fractions Prescribed: 15
Plan Total Prescribed Dose: 40.05 Gy
Reference Point Dosage Given to Date: 34.71 Gy
Reference Point Session Dosage Given: 2.67 Gy
Session Number: 13

## 2022-07-28 ENCOUNTER — Other Ambulatory Visit: Payer: Self-pay

## 2022-07-28 ENCOUNTER — Ambulatory Visit
Admission: RE | Admit: 2022-07-28 | Discharge: 2022-07-28 | Disposition: A | Discharge status: Home / Self Care | Payer: Medicare HMO | Source: Ambulatory Visit | Attending: Radiation Oncology | Admitting: Radiation Oncology

## 2022-07-28 ENCOUNTER — Ambulatory Visit: Payer: Medicare HMO

## 2022-07-28 DIAGNOSIS — D0512 Intraductal carcinoma in situ of left breast: Secondary | ICD-10-CM | POA: Diagnosis not present

## 2022-07-28 LAB — RAD ONC ARIA SESSION SUMMARY
Course Elapsed Days: 19
Plan Fractions Treated to Date: 14
Plan Prescribed Dose Per Fraction: 2.67 Gy
Plan Total Fractions Prescribed: 15
Plan Total Prescribed Dose: 40.05 Gy
Reference Point Dosage Given to Date: 37.38 Gy
Reference Point Session Dosage Given: 2.67 Gy
Session Number: 14

## 2022-07-29 ENCOUNTER — Other Ambulatory Visit: Payer: Self-pay

## 2022-07-29 ENCOUNTER — Encounter: Payer: Self-pay | Admitting: Radiation Oncology

## 2022-07-29 ENCOUNTER — Ambulatory Visit
Admission: RE | Admit: 2022-07-29 | Discharge: 2022-07-29 | Disposition: A | Discharge status: Home / Self Care | Payer: Medicare HMO | Source: Ambulatory Visit | Attending: Radiation Oncology | Admitting: Radiation Oncology

## 2022-07-29 ENCOUNTER — Encounter: Payer: Self-pay | Admitting: *Deleted

## 2022-07-29 ENCOUNTER — Ambulatory Visit: Payer: Medicare HMO

## 2022-07-29 DIAGNOSIS — D0512 Intraductal carcinoma in situ of left breast: Secondary | ICD-10-CM | POA: Diagnosis not present

## 2022-07-29 LAB — RAD ONC ARIA SESSION SUMMARY
Course Elapsed Days: 20
Plan Fractions Treated to Date: 15
Plan Prescribed Dose Per Fraction: 2.67 Gy
Plan Total Fractions Prescribed: 15
Plan Total Prescribed Dose: 40.05 Gy
Reference Point Dosage Given to Date: 40.05 Gy
Reference Point Session Dosage Given: 2.67 Gy
Session Number: 15

## 2022-07-30 ENCOUNTER — Ambulatory Visit: Payer: Medicare HMO

## 2022-08-12 NOTE — Progress Notes (Signed)
Patient Name: Sarah Kline MRN: [id number] DOB: June 19, 1966 Referring Physician: Nicholas Lose (Profile Not Attached) Date of Service: 07/29/2022 [facility] [place], [place]                                                        End Of Treatment Note  Diagnoses: D05.12-Intraductal carcinoma in situ of left breast  Cancer Staging:  Cancer Staging  Ductal carcinoma in situ (DCIS) of left breast Staging form: Breast, AJCC 8th Edition - Clinical stage from 05/21/2022: Stage 0 (cTis (DCIS), cN0, cM0, ER+, PR+) - Signed by Nicholas Lose, MD on 05/21/2022 Stage prefix: Initial diagnosis Nuclear grade: G2  Intent: Curative  Radiation Treatment Dates: 07/09/2022 through 07/29/2022 Site Technique Total Dose (Gy) Dose per Fx (Gy) Completed Fx Beam Energies  Breast, Left: Breast_L 3D 40.05/40.05 2.67 15/15 10XFFF   Narrative: The patient tolerated radiation therapy relatively well.   Plan: The patient will follow-up with radiation oncology in 62mo   -----------------------------------  SEppie Gibson MD

## 2022-08-13 NOTE — Progress Notes (Signed)
[facility] Note  08/15/2022     CHIEF COMPLAINT Patient presents for Retina Follow Up    HISTORY OF PRESENT ILLNESS: Sarah Kline is a 56 y.o. female who presents to the clinic today for:   HPI     Retina Follow Up   Patient presents with  Diabetic Retinopathy.  In both eyes.  Severity is moderate.  Duration of 1 year.  I, the attending physician,  performed the HPI with the patient and updated documentation appropriately.        Comments   Patient feels that the vision is the same. She does not check her sugar and her A1C is 5.3.      Last edited by Bernarda Caffey, MD on 08/17/2022  2:19 AM.    pt states she has kidney disease, but is not on dialysis right now, she states [facility] gave her a few things to help her around the house   Referring physician: Eulis Foster, [place],  [place] [address]  HISTORICAL INFORMATION:   Selected notes from the [facility] Referred by Dr. Caroline More for DM exam LEE:  Ocular Hx- PMH-blind, HTN, DM (A1c: 7.6, 60.10.93, takes trulicity)  Last visit with Dr. 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 Difficult case with large RetEx - s/p SO removal/PPV/MP/EL/SO 12/08/16 - large RetEx extended with dense membrane removed - Retina appears flat, IOP WNL  rtc 4-6 mos      CURRENT MEDICATIONS: No current outpatient medications on  file. (Ophthalmic Drugs)   No current facility-administered medications for this visit. (Ophthalmic Drugs)   Current Outpatient Medications (Other)  Medication Sig   acetaminophen (TYLENOL) 500 MG tablet Take 1,000 mg by mouth every 6 (six) hours as needed for moderate pain.   amLODipine (NORVASC) 10 MG tablet TAKE 1 TABLET EVERY EVENING   atorvastatin (LIPITOR) 40 MG tablet Take 40 mg by mouth at bedtime.   Blood Glucose Monitoring Suppl (PRODIGY AUTOCODE BLOOD GLUCOSE) w/Device KIT 1 kit by Does not apply route as directed.   calcitRIOL (ROCALTROL) 0.25 MCG capsule TAKE 1 CAPSULE EVERY DAY   carvedilol (COREG) 25 MG tablet Take 25 mg by mouth 2 (two) times daily.   Dulaglutide (TRULICITY) 1.5 AT/5.5DD SOPN Inject 1.5 mg into the skin once a week.   losartan (COZAAR) 50 MG tablet Take 50 mg by mouth at bedtime.   sodium bicarbonate 650 MG tablet Take 1,300 mg by mouth 2 (two) times daily.   tamoxifen (NOLVADEX) 10 MG tablet Take 1 tablet (10 mg total) by mouth daily.   No current facility-administered medications for this visit. (Other)   REVIEW OF SYSTEMS: ROS   Positive for: Neurological, Endocrine, Eyes Negative for: Constitutional, Gastrointestinal, Skin, Genitourinary, Musculoskeletal, HENT, Cardiovascular, Respiratory, Psychiatric, Allergic/Imm, Heme/Lymph Last edited by Annie Paras, COT on 08/15/2022  8:59 AM.     ALLERGIES No Active  Allergies  PAST MEDICAL HISTORY Past Medical History:  Diagnosis Date   Anemia    Blind    Both eyes   Breast cancer ([place])    Chronic kidney disease    Diabetes mellitus    Hypertension    Sickle cell disease, type S ([place])    Trait   Past Surgical History:  Procedure Laterality Date   AV FISTULA PLACEMENT Right 03/04/2022   Procedure: RIGHT ARM BRACHIOCEPHALIC FISTULA CREATION;  Surgeon: Broadus John, MD;  [facility];  Service: Vascular;  Laterality: Right;  PERIPHERAL NERVE BLOCK   BREAST LUMPECTOMY WITH RADIOACTIVE  SEED LOCALIZATION Left 05/27/2022   Procedure: LEFT BREAST LUMPECTOMY WITH RADIOACTIVE SEED LOCALIZATION;  Surgeon: Donnie Mesa, MD;  Location: [facility];  Service: General;  Laterality: Left;  LMA   EYE SURGERY Left    retinal detachment repair 2017   EYE SURGERY Left    ? type of surgery to repair blood vessels 2018   GANGLION CYST EXCISION     Left dorsal wrist   RETINAL DETACHMENT SURGERY Left    2017, [facility]   FAMILY HISTORY Family History  Problem Relation Age of Onset   Asthma Mother    COPD Mother    Hypertension Mother    Stroke Mother    Breast cancer Sister 35   Diabetes Daughter    Diabetes Son    SOCIAL HISTORY Social History   Tobacco Use   Smoking status: Never   Smokeless tobacco: Never  Vaping Use   Vaping Use: Never used  Substance Use Topics   Alcohol use: Not Currently    Alcohol/week: 2.0 standard drinks of alcohol    Types: 2 Shots of liquor per week    Comment: twice per month   Drug use: No    Comment: Very remote marijuana use, 20 years ago       OPHTHALMIC EXAM:  Base Eye Exam     Visual Acuity (Snellen - Linear)       Right Left   Dist [place] NLP NLP         Tonometry (Tonopen, 9:04 AM)       Right Left   Pressure 17 15         Pupils       Dark Light Shape React APD   Right 2 2 Round None None   Left 2 2 Round None None         Visual Fields       Left Right   Restrictions Total superior temporal, inferior temporal, superior nasal, inferior nasal deficiencies Total superior temporal, inferior temporal, superior nasal, inferior nasal deficiencies         Extraocular Movement       Right Left    Full, Ortho Full, Ortho         Neuro/Psych     Oriented x3: Yes   Mood/Affect: Normal         Dilation     Both eyes: 1.0% Mydriacyl, 2.5% Phenylephrine @ 9:00 AM           Slit Lamp and Fundus Exam     Slit Lamp Exam       Right Left   Lids/Lashes Dermatochalasis - upper lid Chalazion,  Dermatochalasis - lower lid   Conjunctiva/Sclera Melanosis Melanosis   Cornea pigmented Arcus Arcus, nylon suture at 0130, trace inferior Punctate epithelial erosions, Well healed cataract wounds   Anterior Chamber deep and clear, moderate depth,  very narrow angles deep and clear   Iris 360 Posterior synechiae, + NVI Round and moderately dilated, No NVI   Lens white mature cataract with fibrotic pigment anteriorly, NVI extending on anterior capsule PC IOL in good position, trace Posterior capsular opacification, silicone oil on posterior capsule, pigment on anterior optic   Anterior Vitreous no view post vitrectomy, silicone oil         Fundus Exam       Right Left   Disc no view 4+ Pallor, Sharp rim, attenuated vessels with severe ischemia   C/D Ratio  0.2   Macula no view Extensive pigment and fibrosis under oil   Vessels no view Severe Vascular attenuation, sclerotic vessels superiorly, + fibrosis   Periphery no view Extensive fibrosis, chronic TRD under oil           IMAGING AND PROCEDURES  Imaging and Procedures for _0 @  OCT, Retina - OU - Both Eyes       Right Eye Progression has no prior data. Findings include (No image).   Left Eye Quality was poor. Central Foveal Thickness: 609. Progression has no prior data. Findings include abnormal foveal contour, subretinal hyper-reflective material, vitreous traction, inner retinal atrophy, outer retinal atrophy, preretinal fibrosis (Diffuse atrophy and tractional fibrosis/edema).   Notes *Images captured and stored on drive  Diagnosis / Impression:  OD: no image obtained OS: Diffuse atrophy and tractional fibrosis/edema Poor image quality  Clinical management:  See below  Abbreviations: NFP - Normal foveal profile. CME - cystoid macular edema. PED - pigment epithelial detachment. IRF - intraretinal fluid. SRF - subretinal fluid. EZ - ellipsoid zone. ERM - epiretinal membrane. ORA - outer retinal atrophy. ORT - outer  retinal tubulation. SRHM - subretinal hyper-reflective material            ASSESSMENT/PLAN:    ICD-10-CM   1. 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 Mature cataract  H26.8     3. Posterior synechiae of right eye  H21.541     4. Pseudophakia  Z96.1     5. 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 Proliferative diabetic retinopathy w/ TRD OU (OD > OS)  - pt with extensive history of retinal care at [facility] with Dr. Jacklynn Ganong  - s/p PPV w/ membrane peel and silicon oil x2 OS (05/6598, and 12/2016)  - pt presents today for yearly check   - pt reports no acute changes in vision and reports eyes have been stable and pain free since last visit   - Last A1C 5.3 - patient stopped Trulicity due to cost  - BCVA NLP OU   - OD no view of posterior pole due to 360 PS and mature white cataract  - OS with severe ischemia and fibrosis under silicon oil   - no intervention indicated or recommended at this time  - [facility] with [facility] as of 2021 visit  - f/u prn  2,3. Mature cataract w/ 360 posterior synechiae OD  - low visual potential  - recommend monitoring  4. Pseudophakia OS  - s/p CE/IOL in 2017 with PPV  - PCIOL in good position  - continue to monitor  5. Legal Blindness -- now NLP OU  - secondary to severe PDR with TRDs OU and mature cataract OD  - established  with [facility]  Ophthalmic Meds Ordered this visit:  No orders of the defined types were placed in this encounter.    Return if symptoms worsen or fail to improve.  There are no Patient Instructions on file for this visit.   Explained the diagnoses, plan, and follow up with the patient and they expressed understanding.  Patient expressed understanding of the importance of proper follow up care.   This document serves as a  record of services personally performed by Gardiner Sleeper, MD, PhD. It was created on their behalf by Roselee Nova, COMT. The creation of this record is the provider's dictation and/or activities during the visit.  Electronically signed by: Roselee Nova, COMT 08/17/22 2:21 AM  This document serves as a record of services personally performed by Gardiner Sleeper, MD, PhD. It was created on their behalf by Renaldo Reel, [place] an ophthalmic technician. The creation of this record is the provider's dictation and/or activities during the visit.    Electronically signed by:  Renaldo Reel, COT  08/15/22  2:21 AM  Gardiner Sleeper, M.D., Ph.D. Diseases & Surgery of the Retina and Vitreous [facility]  I have reviewed the above documentation for accuracy and completeness, and I agree with the above. Gardiner Sleeper, M.D., Ph.D. 08/17/22 2:24 AM  Abbreviations: M myopia (nearsighted); A astigmatism; H hyperopia (farsighted); P presbyopia; Mrx spectacle prescription;  CTL contact lenses; OD right eye; OS left eye; OU both eyes  XT exotropia; ET esotropia; PEK punctate epithelial keratitis; PEE punctate epithelial erosions; DES dry eye syndrome; MGD meibomian gland dysfunction; ATs artificial tears; PFAT's preservative free artificial tears; [place] nuclear sclerotic cataract; PSC posterior subcapsular cataract; ERM epi-retinal membrane; PVD posterior vitreous detachment; RD retinal detachment; DM diabetes mellitus; DR diabetic retinopathy; NPDR non-proliferative diabetic retinopathy; PDR proliferative diabetic retinopathy; CSME clinically significant macular edema; DME diabetic macular edema; dbh dot blot hemorrhages; CWS cotton wool spot; POAG primary open angle glaucoma; C/D cup-to-disc ratio; HVF humphrey visual field; GVF goldmann visual field; OCT optical coherence tomography; IOP intraocular pressure; BRVO Branch retinal vein occlusion; CRVO central retinal vein occlusion; CRAO  central retinal artery occlusion; BRAO branch retinal artery occlusion; RT retinal tear; SB scleral buckle; PPV pars plana vitrectomy; VH Vitreous hemorrhage; PRP panretinal laser photocoagulation; IVK intravitreal kenalog; VMT vitreomacular traction; MH Macular hole;  NVD neovascularization of the disc; NVE neovascularization elsewhere; AREDS age related eye disease study; ARMD age related macular degeneration; POAG primary open angle glaucoma; EBMD epithelial/anterior basement membrane dystrophy; ACIOL anterior chamber intraocular lens; IOL intraocular lens; PCIOL posterior chamber intraocular lens; Phaco/IOL phacoemulsification with intraocular lens placement; [place] photorefractive keratectomy; LASIK laser assisted in situ keratomileusis; HTN hypertension; DM diabetes mellitus; COPD chronic obstructive pulmonary disease

## 2022-08-15 ENCOUNTER — Ambulatory Visit (INDEPENDENT_AMBULATORY_CARE_PROVIDER_SITE_OTHER): Payer: Medicare HMO | Admitting: Ophthalmology

## 2022-08-15 DIAGNOSIS — H21541 Posterior synechiae (iris), right eye: Secondary | ICD-10-CM

## 2022-08-15 DIAGNOSIS — E113523 Type 2 diabetes mellitus with proliferative diabetic retinopathy with traction retinal detachment involving the macula, bilateral: Secondary | ICD-10-CM | POA: Diagnosis not present

## 2022-08-15 DIAGNOSIS — H548 Legal blindness, as defined in USA: Secondary | ICD-10-CM

## 2022-08-15 DIAGNOSIS — Z961 Presence of intraocular lens: Secondary | ICD-10-CM

## 2022-08-15 DIAGNOSIS — H268 Other specified cataract: Secondary | ICD-10-CM

## 2022-08-17 ENCOUNTER — Encounter (INDEPENDENT_AMBULATORY_CARE_PROVIDER_SITE_OTHER): Payer: Self-pay | Admitting: Ophthalmology

## 2022-08-26 ENCOUNTER — Other Ambulatory Visit: Payer: Self-pay | Admitting: *Deleted

## 2022-08-26 DIAGNOSIS — N184 Chronic kidney disease, stage 4 (severe): Secondary | ICD-10-CM

## 2022-08-27 ENCOUNTER — Telehealth: Payer: Self-pay

## 2022-08-27 NOTE — Telephone Encounter (Signed)
I called the patient today about her upcoming follow-up appointment in radiation oncology.   Given the state of the COVID-19 pandemic, concerning case numbers in our community, and guidance from [facility], I offered a phone assessment with the patient to determine if coming to the clinic was necessary. She accepted.  The patient denies any symptomatic concerns.  She denies lingering fatigue and reports she's feeling well. She denies lingering tenderness or range of motion concerns to her left breast and left shoulder respectively. She does report lingering swelling to her breast, but declined referral to PT (she states the swelling is not bothersome, and she feels she can manage with manual massage at home). She does have a few areas of dry peeling to her treatment field, but otherwise skin is intact.  Advised her to continue applying radiaplex gel to treatment area until she finishes product. Then she can continue skin care by applying oil or lotion with vitamin E to the skin in the radiation fields, BID, for 2 more months.  She verbalized understanding and agreement.  Continue follow-up with medical oncology - follow-up is scheduled on 10/29/2022 with Annabelle Harman in the [facility].  I explained that yearly mammograms are important for patients with intact breast tissue, and physical exams are important after mastectomy for patients that cannot undergo mammography.  I encouraged her to call if she had further questions or concerns about her healing. Otherwise, she will follow-up PRN in radiation oncology. Patient is pleased with this plan, and we will cancel her upcoming follow-up to reduce the risk of COVID-19 transmission.

## 2022-08-29 ENCOUNTER — Ambulatory Visit: Payer: Self-pay | Admitting: Radiation Oncology

## 2022-08-31 ENCOUNTER — Other Ambulatory Visit: Payer: Self-pay | Admitting: Family Medicine

## 2022-08-31 DIAGNOSIS — I10 Essential (primary) hypertension: Secondary | ICD-10-CM

## 2022-09-05 ENCOUNTER — Ambulatory Visit: Payer: Medicare HMO | Admitting: Physician Assistant

## 2022-09-05 ENCOUNTER — Ambulatory Visit (HOSPITAL_COMMUNITY)
Admission: RE | Admit: 2022-09-05 | Discharge: 2022-09-05 | Disposition: A | Discharge status: Home / Self Care | Payer: Medicare HMO | Source: Ambulatory Visit | Attending: Vascular Surgery | Admitting: Vascular Surgery

## 2022-09-05 VITALS — BP 156/66 | HR 54 | Temp 97.3°F | Resp 14 | Ht 65.0 in | Wt 231.0 lb

## 2022-09-05 DIAGNOSIS — N184 Chronic kidney disease, stage 4 (severe): Secondary | ICD-10-CM | POA: Diagnosis present

## 2022-09-05 NOTE — Progress Notes (Signed)
Established Dialysis Access   History of Present Illness   Sarah Kline is a 56 y.o. (07-10-1966) female who presents for follow up of established dialysis access.  She currently does not require hemodialysis.  In preparation for possible need of dialysis in the future, she underwent right brachiocephalic fistula creation on 03/04/2022 by Dr. Virl Cagey.  At a follow-up appointment in May 2023 she was doing well, occasionally experiencing some intermittent paresthesias on the dorsal aspect of her right hand.  Her fistula had reduced inflow at the time and was too deep for cannulation, so she was to follow-up in 1 month for repeat studies.  She is here today to see if her fistula has matured.  She denies any symptoms of steal.  She is no longer experiencing any paresthesias in her right hand.  Current Outpatient Medications  Medication Sig Dispense Refill   acetaminophen (TYLENOL) 500 MG tablet Take 1,000 mg by mouth every 6 (six) hours as needed for moderate pain.     amLODipine (NORVASC) 10 MG tablet TAKE 1 TABLET EVERY EVENING 90 tablet 3   atorvastatin (LIPITOR) 40 MG tablet Take 40 mg by mouth at bedtime.     Blood Glucose Monitoring Suppl (PRODIGY AUTOCODE BLOOD GLUCOSE) w/Device KIT 1 kit by Does not apply route as directed.     calcitRIOL (ROCALTROL) 0.25 MCG capsule TAKE 1 CAPSULE EVERY DAY 90 capsule 3   carvedilol (COREG) 25 MG tablet Take 25 mg by mouth 2 (two) times daily.     Dulaglutide (TRULICITY) 1.5 EG/3.1DV SOPN Inject 1.5 mg into the skin once a week. 12 pen 0   losartan (COZAAR) 50 MG tablet Take 50 mg by mouth at bedtime.     sodium bicarbonate 650 MG tablet Take 1,300 mg by mouth 2 (two) times daily.  6   tamoxifen (NOLVADEX) 10 MG tablet Take 1 tablet (10 mg total) by mouth daily. 90 tablet 3   No current facility-administered medications for this visit.    REVIEW OF SYSTEMS (negative unless checked):   Cardiac:  _0  Chest pain or chest pressure? _1  Shortness  of breath upon activity? _2  Shortness of breath when lying flat? _3  Irregular heart rhythm?  Vascular:  _4  Pain in calf, thigh, or hip brought on by walking? _5  Pain in feet at night that wakes you up from your sleep? _6  Blood clot in your veins? _7  Leg swelling?  Pulmonary:  _8  Oxygen at home? _9  Productive cough? _10  Wheezing?  Neurologic:  _11  Sudden weakness in arms or legs? _12  Sudden numbness in arms or legs? _13  Sudden onset of difficult speaking or slurred speech? _14  Temporary loss of vision in one eye? _15  Problems with dizziness?  Gastrointestinal:  _16  Blood in stool? _17  Vomited blood?  Genitourinary:  _18  Burning when urinating? _19  Blood in urine?  Psychiatric:  _20  Major depression  Hematologic:  _21  Bleeding problems? _22  Problems with blood clotting?  Dermatologic:  _23  Rashes or ulcers?  Constitutional:  _24  Fever or chills?  Ear/Nose/Throat:  _25  Change in hearing? _26  Nose bleeds? _27  Sore throat?  Musculoskeletal:  _28  Back pain? _29  Joint pain? _30  Muscle pain?   Physical Examination   Vitals:   09/05/22 1234  BP: (!) 156/66  Pulse: (!) 54  Resp: 14  Temp: (!) 97.3 F (36.3 C)  TempSrc: Temporal  SpO2: 94%  Weight: 231 lb (104.8 kg)  Height: _31  (1.651 m)   Body mass index is 38.44 kg/m.  General:  WDWN in NAD; vital signs documented above Gait: Not observed HENT: WNL, normocephalic Pulmonary: normal non-labored breathing , without Rales, rhonchi,  wheezing Cardiac: regular HR, without murmurs without carotid bruit Abdomen: soft, NT, no masses Skin: without rashes Vascular Exam/Pulses: Palpable radial pulses bilaterally.  Right brachiocephalic fistula with palpable thrill Extremities: without ischemic changes, without Gangrene , without cellulitis; without open wounds;  Musculoskeletal: no muscle wasting or atrophy  Neurologic: A&O X 3;  No focal weakness or paresthesias are detected Psychiatric:  The pt has Normal  affect.   Non-invasive Vascular Imaging   Right Arm Access Duplex  (09/05/2022):   +--------------------+----------+-----------------+--------+  AVF                 PSV (cm/s)Flow Vol (mL/min)Comments  +--------------------+----------+-----------------+--------+  Native artery inflow   256           728                 +--------------------+----------+-----------------+--------+  AVF Anastomosis        900                               +--------------------+----------+-----------------+--------+      +------------+----------+-------------+----------+--------+  OUTFLOW VEINPSV (cm/s)Diameter (cm)Depth (cm)Describe  +------------+----------+-------------+----------+--------+  Shoulder       163        0.72        1.50             +------------+----------+-------------+----------+--------+  Prox UA        223        0.78        0.95             +------------+----------+-------------+----------+--------+  Mid UA         150        0.73        0.41             +------------+----------+-------------+----------+--------+  Dist UA        284        0.93        0.27             +------------+----------+-------------+----------+--------+  AC Fossa       183        1.12        0.18             +------------+----------+-------------+----------+--------+     Medical Decision Making   MADISON DIRENZO is a 56 y.o. female who is s/p right brachiocephalic fistula creation on 03/04/2022 by Dr. Virl Cagey  Duplex study today reveals patent brachiocephalic fistula in the right arm with no signs of stenosis.  Her inflow is 728 today, which is much better than her last visit with [place] in May.  Her fistula is also matured to be greater than 6 mm throughout its entirety.  The majority of the fistula from the mid upper arm arm to the [facility] fossa is also less than 6 mm from the surface of the skin. Her fistula has a palpable thrill.  She has palpable right radial  pulse. I believe that she will be able to use this fistula if and when she requires hemodialysis.  I have encouraged her to continue exercising her right arm to ensure the fistula's patency She can follow-up with [place] as needed   Vicente Serene PA-C [facility] Office: [facility] MD: Virl Cagey

## 2022-09-14 ENCOUNTER — Other Ambulatory Visit: Payer: Self-pay | Admitting: Family Medicine

## 2022-10-29 ENCOUNTER — Inpatient Hospital Stay: Payer: Medicare HMO | Attending: Adult Health | Admitting: Adult Health

## 2022-10-29 ENCOUNTER — Encounter: Payer: Self-pay | Admitting: Adult Health

## 2022-10-29 ENCOUNTER — Other Ambulatory Visit: Payer: Self-pay

## 2022-10-29 VITALS — BP 125/61 | HR 64 | Temp 97.7°F | Resp 16 | Ht 65.0 in | Wt 216.7 lb

## 2022-10-29 DIAGNOSIS — Z8249 Family history of ischemic heart disease and other diseases of the circulatory system: Secondary | ICD-10-CM | POA: Diagnosis not present

## 2022-10-29 DIAGNOSIS — Z823 Family history of stroke: Secondary | ICD-10-CM | POA: Diagnosis not present

## 2022-10-29 DIAGNOSIS — Z833 Family history of diabetes mellitus: Secondary | ICD-10-CM | POA: Diagnosis not present

## 2022-10-29 DIAGNOSIS — Z170 Estrogen receptor positive status [ER+]: Secondary | ICD-10-CM | POA: Insufficient documentation

## 2022-10-29 DIAGNOSIS — R5383 Other fatigue: Secondary | ICD-10-CM | POA: Diagnosis not present

## 2022-10-29 DIAGNOSIS — Z803 Family history of malignant neoplasm of breast: Secondary | ICD-10-CM | POA: Diagnosis not present

## 2022-10-29 DIAGNOSIS — Z825 Family history of asthma and other chronic lower respiratory diseases: Secondary | ICD-10-CM | POA: Insufficient documentation

## 2022-10-29 DIAGNOSIS — I10 Essential (primary) hypertension: Secondary | ICD-10-CM | POA: Insufficient documentation

## 2022-10-29 DIAGNOSIS — D0512 Intraductal carcinoma in situ of left breast: Secondary | ICD-10-CM | POA: Insufficient documentation

## 2022-10-29 DIAGNOSIS — Z79899 Other long term (current) drug therapy: Secondary | ICD-10-CM | POA: Insufficient documentation

## 2022-10-29 DIAGNOSIS — Z79810 Long term (current) use of selective estrogen receptor modulators (SERMs): Secondary | ICD-10-CM | POA: Diagnosis not present

## 2022-10-29 NOTE — Progress Notes (Signed)
SURVIVORSHIP VISIT:    BRIEF ONCOLOGIC HISTORY:  Oncology History  Ductal carcinoma in situ (DCIS) of left breast  05/14/2022 Initial Diagnosis   Screening mammogram detected left breast calcifications measuring 1.9 cm, biopsy revealed grade 2 DCIS and CSL, ER 100%, PR 95%   05/21/2022 Cancer Staging   Staging form: Breast, AJCC 8th Edition - Clinical stage from 05/21/2022: Stage 0 (cTis (DCIS), cN0, cM0, ER+, PR+) - Signed by Nicholas Lose, MD on 05/21/2022 Stage prefix: Initial diagnosis Nuclear grade: G2   05/27/2022 Surgery   Left lumpectomy: DCIS intermediate grade with focal necrosis and calcifications spanning a fibrotic area 4.6 cm in a discontinuous manner, no invasive cancer, ER 100%, PR 95%   07/09/2022 - 07/29/2022 Radiation Therapy   Site Technique Total Dose (Gy) Dose per Fx (Gy) Completed Fx Beam Energies  Breast, Left: Breast_L 3D 40.05/40.05 2.67 15/15 10XFFF     08/2022 -  Anti-estrogen oral therapy   10 mg Tamoxifen     INTERVAL HISTORY:  Sarah Kline to review her survivorship care plan detailing her treatment course for breast cancer, as well as monitoring long-term side effects of that treatment, education regarding health maintenance, screening, and overall wellness and health promotion.     Overall, Sarah Kline reports feeling quite well.  She is taking Tamoxifen daily and is tolerating this well for the most part.  She is experiencing some mild fatigue.    REVIEW OF SYSTEMS:  Review of Systems  Constitutional:  Positive for fatigue. Negative for appetite change, chills, fever and unexpected weight change.  HENT:   Negative for hearing loss, lump/mass and trouble swallowing.   Eyes:  Negative for eye problems and icterus.  Respiratory:  Negative for chest tightness, cough and shortness of breath.   Cardiovascular:  Negative for chest pain, leg swelling and palpitations.  Gastrointestinal:  Negative for abdominal distention, abdominal pain, constipation,  diarrhea, nausea and vomiting.  Endocrine: Negative for hot flashes.  Genitourinary:  Negative for difficulty urinating.   Musculoskeletal:  Negative for arthralgias.  Skin:  Negative for itching and rash.  Neurological:  Negative for dizziness, extremity weakness, headaches and numbness.  Hematological:  Negative for adenopathy. Does not bruise/bleed easily.  Psychiatric/Behavioral:  Negative for depression. The patient is not nervous/anxious.   Breast: Denies any new nodularity, masses, tenderness, nipple changes, or nipple discharge.    ONCOLOGY TREATMENT TEAM:  1. Surgeon:  Dr. Georgette Dover at [facility] 2. Medical Oncologist: Dr. Lindi Adie  3. Radiation Oncologist: Dr. 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[place])    Trait   Past Surgical History:  Procedure Laterality Date   AV FISTULA PLACEMENT Right 03/04/2022   Procedure: RIGHT ARM BRACHIOCEPHALIC FISTULA CREATION;  Surgeon: Broadus John, MD;  Location: [facility];  Service: Vascular;  Laterality: Right;  PERIPHERAL NERVE BLOCK   BREAST LUMPECTOMY WITH RADIOACTIVE SEED LOCALIZATION Left 05/27/2022   Procedure: LEFT BREAST LUMPECTOMY WITH RADIOACTIVE SEED LOCALIZATION;  Surgeon: Donnie Mesa, MD;  Location: [facility];  Service: General;  Laterality: Left;  LMA   EYE SURGERY Left    retinal detachment repair 2017   EYE SURGERY Left    ? type of surgery to repair blood vessels 2018  GANGLION CYST EXCISION     Left dorsal wrist   RETINAL DETACHMENT SURGERY Left    2017, [place]     ALLERGIES:  No Known Allergies   CURRENT MEDICATIONS:  Outpatient Encounter Medications as of 10/29/2022  Medication Sig Note   acetaminophen (TYLENOL) 500 MG tablet Take 1,000 mg by mouth every 6 (six) hours as needed for moderate pain.     amLODipine (NORVASC) 10 MG tablet TAKE 1 TABLET EVERY EVENING    atorvastatin (LIPITOR) 40 MG tablet Take 40 mg by mouth at bedtime.    Blood Glucose Monitoring Suppl (PRODIGY AUTOCODE BLOOD GLUCOSE) w/Device KIT 1 kit by Does not apply route as directed.    calcitRIOL (ROCALTROL) 0.25 MCG capsule TAKE 1 CAPSULE EVERY DAY    carvedilol (COREG) 25 MG tablet Take 25 mg by mouth 2 (two) times daily.    Dulaglutide (TRULICITY) 1.5 JK/0.9FG SOPN Inject 1.5 mg into the skin once a week. 02/27/2022: mondays   losartan (COZAAR) 50 MG tablet Take 50 mg by mouth at bedtime.    sodium bicarbonate 650 MG tablet Take 1,300 mg by mouth 2 (two) times daily.    tamoxifen (NOLVADEX) 10 MG tablet Take 1 tablet (10 mg total) by mouth daily.    No facility-administered encounter medications on file as of 10/29/2022.     ONCOLOGIC FAMILY HISTORY:  Family History  Problem Relation Age of Onset   Asthma Mother    COPD Mother    Hypertension Mother    Stroke Mother    Breast cancer Sister 71   Diabetes Daughter    Diabetes Son      SOCIAL HISTORY:  Social History   Socioeconomic History   Marital status: Married    Spouse name: Not on file   Number of children: Not on file   Years of education: Not on file   Highest education level: Not on file  Occupational History   Not on file  Tobacco Use   Smoking status: Never   Smokeless tobacco: Never  Vaping Use   Vaping Use: Never used  Substance and Sexual Activity   Alcohol use: Not Currently    Alcohol/week: 2.0 standard drinks of alcohol    Types: 2 Shots of liquor per week    Comment: twice per month   Drug use: No    Comment: Very remote marijuana use, 20 years ago   Sexual activity: Not Currently    Birth control/protection: Post-menopausal  Other Topics Concern   Not on file  Social History Narrative   Not on file   Social Determinants of Health   Financial Resource Strain: Not on file  Food Insecurity: Not on file   Transportation Needs: Not on file  Physical Activity: Not on file  Stress: Not on file  Social Connections: Not on file  Intimate Partner Violence: Not on file     OBSERVATIONS/OBJECTIVE:  BP 125/61 (BP Location: Left Arm, Patient Position: Sitting)   Pulse 64   Temp 97.7 F (36.5 C) (Temporal)   Resp 16   Ht _0  (1.651 m)   Wt 216 lb 11.2 oz (98.3 kg)   LMP 04/28/2018   SpO2 95%   BMI 36.06 kg/m  GENERAL: Patient is a well appearing female in no acute distress HEENT:  Sclerae anicteric.  Oropharynx clear and moist. No ulcerations or evidence of oropharyngeal candidiasis. Neck is supple.  NODES:  No cervical, supraclavicular, or axillary lymphadenopathy palpated.  BREAST EXAM:  Left breast  s/p lumpectomy and radiation, no sign of local recurrence, right breast benign LUNGS:  Clear to auscultation bilaterally.  No wheezes or rhonchi. HEART:  Regular rate and rhythm. No murmur appreciated. ABDOMEN:  Soft, nontender.  Positive, normoactive bowel sounds. No organomegaly palpated. MSK:  No focal spinal tenderness to palpation. Full range of motion bilaterally in the upper extremities. EXTREMITIES:  No peripheral edema.   SKIN:  Clear with no obvious rashes or skin changes. No nail dyscrasia. NEURO:  Nonfocal. Well oriented.  Appropriate affect.  LABORATORY DATA:  None for this visit.  DIAGNOSTIC IMAGING:  None for this visit.      ASSESSMENT AND PLAN:  Ms.. Sarah Kline is a pleasant 56 y.o. female with Stage 0 left breast DCIS, ER+/PR+, diagnosed in 03/2022, treated with lumpectomy, adjuvant radiation therapy, and anti-estrogen therapy with Tamoxifen beginning in 08/2022.  She presents to the Survivorship Clinic for our initial meeting and routine follow-up post-completion of treatment for breast cancer.    1. Stage 0 left breast cancer:  Ms. Nevers is continuing to recover from definitive treatment for breast cancer. She will follow-up with her medical oncologist, Dr.  Lindi Adie in 6 months with history and physical exam per surveillance protocol.  She will continue her anti-estrogen therapy with Tamoxifen. Thus far, she is tolerating the Tamoxifen well, with minimal side effects. She was instructed to make Dr. Lindi Adie or myself aware if she begins to experience any worsening side effects of the medication and I could see her back in clinic to help manage those side effects, as needed. Her mammogram is due 04/2023; orders placed today. Today, a comprehensive survivorship care plan and treatment summary was reviewed with the patient today detailing her breast cancer diagnosis, treatment course, potential late/long-term effects of treatment, appropriate follow-up care with recommendations for the future, and patient education resources.  A copy of this summary, along with a letter will be sent to the patient's primary care provider via mail/fax/In Basket message after today's visit.    2. Bone health:  She was given education on specific activities to promote bone health.  3. Cancer screening:  Due to Ms. Man's history and her age, she should receive screening for skin cancers, colon cancer, and gynecologic cancers. I placed a referral to GYN at her request today. The information and recommendations are listed on the patient's comprehensive care plan/treatment summary and were reviewed in detail with the patient.    4. Health maintenance and wellness promotion: Ms. Demuro was encouraged to consume 5-7 servings of fruits and vegetables per day. We reviewed the "Nutrition Rainbow" handout.  She was also encouraged to engage in moderate to vigorous exercise for 30 minutes per day most days of the week. We discussed the [employer] fitness program, which is designed for cancer survivors to help them become more physically fit after cancer treatments.  She was instructed to limit her alcohol consumption and continue to abstain from tobacco use.     5. Support  services/counseling: It is not uncommon for this period of the patient's cancer care trajectory to be one of many emotions and stressors.  She was given information regarding our available services and encouraged to contact me with any questions or for help enrolling in any of our support group/programs.    Follow up instructions:    -Return to cancer center in 6 months for f/u with Dr. Lindi Adie  -Mammogram due in 05/2023 -She is welcome to return back to the Survivorship Clinic at any time;  no additional follow-up needed at this time.  -Consider referral back to survivorship as a long-term survivor for continued surveillance  The patient was provided an opportunity to ask questions and all were answered. The patient agreed with the plan and demonstrated an understanding of the instructions.   Total encounter time:30 minutes*in face-to-face visit time, chart review, lab review, care coordination, order entry, and documentation of the encounter time.    Wilber Bihari, NP 10/31/22 7:09 AM Medical Oncology and Hematology [facility] [place], [place] [address] Tel. [phone]    Fax. [phone]  *Total Encounter Time as defined by the Centers for Medicare and Medicaid Services includes, in addition to the face-to-face time of a patient visit (documented in the note above) non-face-to-face time: obtaining and reviewing outside history, ordering and reviewing medications, tests or procedures, care coordination (communications with other health care professionals or caregivers) and documentation in the medical record.

## 2023-01-27 ENCOUNTER — Other Ambulatory Visit: Payer: Self-pay | Admitting: Otolaryngology

## 2023-02-09 NOTE — Progress Notes (Signed)
Spoke with staff at Dr. Deeann Saint office, patient does not meet criteria for [facility] (ASA level 4 per previous anesthesia notes) and will need to be moved to [place].

## 2023-02-16 NOTE — Pre-Procedure Instructions (Signed)
Surgical Instructions    Your procedure is scheduled on February 20, 2023.  Report to [facility] Main Entrance "A" at 11:15 A.M., then check in with the Admitting office.  Call this number if you have problems the morning of surgery:  [phone]  If you have any questions prior to your surgery date call [phone]: Open Monday-Friday 8am-4pm If you experience any cold or flu symptoms such as cough, fever, chills, shortness of breath, etc. between now and your scheduled surgery, please notify us at the above number.     Remember:  Do not eat or drink after midnight the night before your surgery     Take these medicines the morning of surgery with A SIP OF WATER:  carvedilol (COREG)   tamoxifen (NOLVADEX)     Follow your surgeon's instructions on when to stop Aspirin.  If no instructions were given by your surgeon then you will need to call the office to get those instructions.    As of today, STOP taking any Aleve, Naproxen, Ibuprofen, Motrin, Advil, Goody's, BC's, all herbal medications, fish oil, and all vitamins.   WHAT DO I DO ABOUT MY DIABETES MEDICATION?   STOP taking your Dulaglutide (TRULICITY) one week prior to surgery.   HOW TO MANAGE YOUR DIABETES BEFORE AND AFTER SURGERY  Why is it important to control my blood sugar before and after surgery? Improving blood sugar levels before and after surgery helps healing and can limit problems. A way of improving blood sugar control is eating a healthy diet by:  Eating less sugar and carbohydrates  Increasing activity/exercise  Talking with your doctor about reaching your blood sugar goals High blood sugars (greater than 180 mg/dL) can raise your risk of infections and slow your recovery, so you will need to focus on controlling your diabetes during the weeks before surgery. Make sure that the doctor who takes care of your diabetes knows about your planned surgery including the date and location.  How do I manage my blood  sugar before surgery? Check your blood sugar at least 4 times a day, starting 2 days before surgery, to make sure that the level is not too high or low.  Check your blood sugar the morning of your surgery when you wake up and every 2 hours until you get to the Short Stay unit.  If your blood sugar is less than 70 mg/dL, you will need to treat for low blood sugar: Do not take insulin. Treat a low blood sugar (less than 70 mg/dL) with  cup of clear juice (cranberry or apple), 4 glucose tablets, OR glucose gel. Recheck blood sugar in 15 minutes after treatment (to make sure it is greater than 70 mg/dL). If your blood sugar is not greater than 70 mg/dL on recheck, call [phone] for further instructions. Report your blood sugar to the short stay nurse when you get to Short Stay.  If you are admitted to the hospital after surgery: Your blood sugar will be checked by the staff and you will probably be given insulin after surgery (instead of oral diabetes medicines) to make sure you have good blood sugar levels. The goal for blood sugar control after surgery is 80-180 mg/dL.                      Do NOT Smoke (Tobacco/Vaping) for 24 hours prior to your procedure.  If you use a CPAP at night, you may bring your mask/headgear for your overnight stay.  Contacts, glasses, piercing's, hearing aid's, dentures or partials may not be worn into surgery, please bring cases for these belongings.    For patients admitted to the hospital, discharge time will be determined by your treatment team.   Patients discharged the day of surgery will not be allowed to drive home, and someone needs to stay with them for 24 hours.  SURGICAL WAITING ROOM VISITATION Patients having surgery or a procedure may have no more than 2 support people in the waiting area - these visitors may rotate.   Children under the age of 69 must have an adult with them who is not the patient. If the patient needs to stay at the  hospital during part of their recovery, the visitor guidelines for inpatient rooms apply. Pre-op nurse will coordinate an appropriate time for 1 support person to accompany patient in pre-op.  This support person may not rotate.   Please refer to the [facility] website for the visitor guidelines for Inpatients (after your surgery is over and you are in a regular room).    Special instructions:   [place]- Preparing For Surgery  Before surgery, you can play an important role. Because skin is not sterile, your skin needs to be as free of germs as possible. You can reduce the number of germs on your skin by washing with CHG (chlorahexidine gluconate) Soap before surgery.  CHG is an antiseptic cleaner which kills germs and bonds with the skin to continue killing germs even after washing.    Oral Hygiene is also important to reduce your risk of infection.  Remember - BRUSH YOUR TEETH THE MORNING OF SURGERY WITH YOUR REGULAR TOOTHPASTE  Please do not use if you have an allergy to CHG or antibacterial soaps. If your skin becomes reddened/irritated stop using the CHG.  Do not shave (including legs and underarms) for at least 48 hours prior to first CHG shower. It is OK to shave your face.  Please follow these instructions carefully.   Shower the NIGHT BEFORE SURGERY and the MORNING OF SURGERY  If you chose to wash your hair, wash your hair first as usual with your normal shampoo.  After you shampoo, rinse your hair and body thoroughly to remove the shampoo.  Use CHG Soap as you would any other liquid soap. You can apply CHG directly to the skin and wash gently with a scrungie or a clean washcloth.   Apply the CHG Soap to your body ONLY FROM THE NECK DOWN.  Do not use on open wounds or open sores. Avoid contact with your eyes, ears, mouth and genitals (private parts). Wash Face and genitals (private parts)  with your normal soap.   Wash thoroughly, paying special attention to the area where  your surgery will be performed.  Thoroughly rinse your body with warm water from the neck down.  DO NOT shower/wash with your normal soap after using and rinsing off the CHG Soap.  Pat yourself dry with a CLEAN TOWEL.  Wear CLEAN PAJAMAS to bed the night before surgery  Place CLEAN SHEETS on your bed the night before your surgery  DO NOT SLEEP WITH PETS.   Day of Surgery: Take a shower with CHG soap. Do not wear jewelry or makeup Do not wear lotions, powders, perfumes/colognes, or deodorant. Do not shave 48 hours prior to surgery.  Men may shave face and neck. Do not bring valuables to the hospital.  [facility] is not responsible for any belongings or valuables. Do  not wear nail polish, gel polish, artificial nails, or any other type of covering on natural nails (fingers and toes) If you have artificial nails or gel coating that need to be removed by a nail salon, please have this removed prior to surgery. Artificial nails or gel coating may interfere with anesthesia's ability to adequately monitor your vital signs.  Wear Clean/Comfortable clothing the morning of surgery Remember to brush your teeth WITH YOUR REGULAR TOOTHPASTE.   Please read over the following fact sheets that you were given.    If you received a COVID test during your pre-op visit  it is requested that you wear a mask when out in public, stay away from anyone that may not be feeling well and notify your surgeon if you develop symptoms. If you have been in contact with anyone that has tested positive in the last 10 days please notify you surgeon.

## 2023-02-17 ENCOUNTER — Encounter (HOSPITAL_COMMUNITY): Payer: Self-pay

## 2023-02-17 ENCOUNTER — Other Ambulatory Visit: Payer: Self-pay

## 2023-02-17 ENCOUNTER — Encounter (HOSPITAL_COMMUNITY)
Admission: RE | Admit: 2023-02-17 | Discharge: 2023-02-17 | Disposition: A | Discharge status: Home / Self Care | Payer: Medicare HMO | Source: Ambulatory Visit | Attending: Otolaryngology | Admitting: Otolaryngology

## 2023-02-17 VITALS — BP 144/71 | HR 54 | Temp 98.2°F | Resp 18 | Ht 65.0 in | Wt 226.3 lb

## 2023-02-17 DIAGNOSIS — E1122 Type 2 diabetes mellitus with diabetic chronic kidney disease: Secondary | ICD-10-CM | POA: Diagnosis not present

## 2023-02-17 DIAGNOSIS — Z01812 Encounter for preprocedural laboratory examination: Secondary | ICD-10-CM | POA: Diagnosis not present

## 2023-02-17 DIAGNOSIS — N184 Chronic kidney disease, stage 4 (severe): Secondary | ICD-10-CM | POA: Insufficient documentation

## 2023-02-17 DIAGNOSIS — E119 Type 2 diabetes mellitus without complications: Secondary | ICD-10-CM

## 2023-02-17 DIAGNOSIS — Z8673 Personal history of transient ischemic attack (TIA), and cerebral infarction without residual deficits: Secondary | ICD-10-CM | POA: Diagnosis not present

## 2023-02-17 DIAGNOSIS — I129 Hypertensive chronic kidney disease with stage 1 through stage 4 chronic kidney disease, or unspecified chronic kidney disease: Secondary | ICD-10-CM | POA: Insufficient documentation

## 2023-02-17 DIAGNOSIS — D573 Sickle-cell trait: Secondary | ICD-10-CM | POA: Insufficient documentation

## 2023-02-17 LAB — BASIC METABOLIC PANEL
Anion gap: 9 (ref 5–15)
BUN: 58 mg/dL — ABNORMAL HIGH (ref 6–20)
CO2: 20 mmol/L — ABNORMAL LOW (ref 22–32)
Calcium: 8.9 mg/dL (ref 8.9–10.3)
Chloride: 112 mmol/L — ABNORMAL HIGH (ref 98–111)
Creatinine, Ser: 3.74 mg/dL — ABNORMAL HIGH (ref 0.44–1.00)
GFR, Estimated: 13 mL/min — ABNORMAL LOW (ref >60)
Glucose, Bld: 170 mg/dL — ABNORMAL HIGH (ref 70–99)
Potassium: 5.5 mmol/L — ABNORMAL HIGH (ref 3.5–5.1)
Sodium: 141 mmol/L (ref 135–145)

## 2023-02-17 LAB — CBC
HCT: 30 % — ABNORMAL LOW (ref 36.0–46.0)
Hemoglobin: 9.8 g/dL — ABNORMAL LOW (ref 12.0–15.0)
MCH: 30.1 pg (ref 26.0–34.0)
MCHC: 32.7 g/dL (ref 30.0–36.0)
MCV: 92 fL (ref 80.0–100.0)
Platelets: 220 10*3/uL (ref 150–400)
RBC: 3.26 MIL/uL — ABNORMAL LOW (ref 3.87–5.11)
RDW: 14.7 % (ref 11.5–15.5)
WBC: 5.2 10*3/uL (ref 4.0–10.5)
nRBC: 0 % (ref 0.0–0.2)

## 2023-02-17 LAB — HEMOGLOBIN A1C
Hgb A1c MFr Bld: 7.3 % — ABNORMAL HIGH (ref 4.8–5.6)
Mean Plasma Glucose: 163 mg/dL

## 2023-02-17 LAB — GLUCOSE, CAPILLARY: Glucose-Capillary: 151 mg/dL — ABNORMAL HIGH (ref 70–99)

## 2023-02-17 NOTE — Progress Notes (Addendum)
PCP - Cipriano Mile, NP Cardiologist - Dr. Flo Shanks (started seeing him for cardiac work-up to be placed on transplant list)  PPM/ICD - Denies Device Orders - n/a Rep Notified - n/a  Chest x-ray - n/a EKG - 03/04/2022 Stress Test - 02/04/2022 (CE) ECHO - 02/04/2022 (CE) Cardiac Cath - Denies  Sleep Study - Denies CPAP - n/a  Pt is DM2. She does not have a cbg meter at home thus doesn't check her blood sugars. She does not know what her normal fasting range is. CBG at pat appointment was 151. She has had nothing to eat and only water to drink today. A1c pending. RN asked pt if she was symptomatic when her blood sugar gets low and pt said yes. RN gave her instructions morning of surgery to drink 1/2 cup of apple or cranberry juice ONLY if she feels she is symptomatic due to a low blood sugar. Pt understood instructions.  Last dose of GLP1 agonist- n/a GLP1 instructions: n/a  Blood Thinner Instructions: n/a Aspirin Instructions: Pt has already stopped her ASA. Last dose was 02/02/23  NPO after midnight.  COVID TEST- n/a   Anesthesia review: Yes. Discussed with Karoline Caldwell, PA-C (he also wrote a pre-op note last year prior to sx). Pt is currently being work-up for kidney transplant list. Last creatinine in June 2023 was 3.61. Pt does not take any medications for DM. A1c pending. Abnormal Hgb, Potassium, and creatinine level.  Patient denies shortness of breath, fever, cough and chest pain at PAT appointment. Pt denies any respiratory illness/infection in the last two months.   All instructions explained to the patient, with a verbal understanding of the material. Patient agrees to go over the instructions while at home for a better understanding. Patient also instructed to self quarantine after being tested for COVID-19. The opportunity to ask questions was provided.

## 2023-02-18 NOTE — Anesthesia Preprocedure Evaluation (Addendum)
Anesthesia Evaluation  Patient identified by MRN, date of birth, ID band Patient awake    Reviewed: Allergy & Precautions, NPO status , Patient's Chart, lab work & pertinent test results, reviewed documented beta blocker date and time   Airway Mallampati: III  TM Distance: >3 FB Neck ROM: Full    Dental  (+) Teeth Intact, Dental Advisory Given   Pulmonary  Likely OSA- snores, sometimes wakes herself up and obstructs per husband- has never been tested   Pulmonary exam normal breath sounds clear to auscultation       Cardiovascular hypertension (151/64 preop), Pt. on medications and Pt. on home beta blockers Normal cardiovascular exam Rhythm:Regular Rate:Normal  TTE 02/04/2022 (Care Everywhere): SUMMARY  The left ventricular size is normal. Mild left ventricular  hypertrophy. The left ventricle is hyperdynamic. LV ejection fraction  = 65-70%.   LV hypertrophy noted of all segments   1.2cm  with superimposed  asymmetric nature  septum is 1.6cm . Mild systolic anterior motion of  the mitral valve is noted.   There is no comparison study available.  There is no significant valvular stenosis or regurgitation.  The right ventricle is normal in size and function.   Cardiac PET stress test 02/04/2022 (Care Everywhere): 1.  Small fixed perfusion defect involving the distal-mid lateral wall segments, with small amount of peri-infarct ischemia.  2.  Normal left ventricular ejection fraction.  3.  Total coronary flow reserve: 1.57. Globally diminished coronary flow reserve, likely represents significant microvascular disease.  4.  Ancillary CT findings as above.     Neuro/Psych CVA (2017)  negative psych ROS   GI/Hepatic negative GI ROS, Neg liver ROS,,,  Endo/Other  diabetes, Well Controlled, Type 2  Obesity BMI 38  Renal/GU ESRFRenal diseaseCKD 5, not yet on HD but AVF placed K 5.2 this AM  negative genitourinary    Musculoskeletal negative musculoskeletal ROS (+)    Abdominal  (+) + obese  Peds  Hematology  (+) Blood dyscrasia, Sickle cell trait and anemia Hb 9.8, plt 220   Anesthesia Other Findings Trulicity- LD [id number] Blind B/L   Reproductive/Obstetrics negative OB ROS                              Anesthesia Physical Anesthesia Plan  ASA: 3  Anesthesia Plan: General   Post-op Pain Management: Ofirmev IV (intra-op)*   Induction: Intravenous  PONV Risk Score and Plan: 3 and Ondansetron, Midazolam, Treatment may vary due to age or medical condition and Dexamethasone  Airway Management Planned: Oral ETT  Additional Equipment: None  Intra-op Plan:   Post-operative Plan: Extubation in OR  Informed Consent: I have reviewed the patients History and Physical, chart, labs and discussed the procedure including the risks, benefits and alternatives for the proposed anesthesia with the patient or authorized representative who has indicated his/her understanding and acceptance.     Dental advisory given  Plan Discussed with: CRNA  Anesthesia Plan Comments:          Anesthesia Quick Evaluation

## 2023-02-18 NOTE — Progress Notes (Signed)
Anesthesia Chart Review:  57 year old female with pertinent history including non-insulin-dependent DM2, HTN, CKD 4, sickle cell trait, anemia, TIA 2017.  Patient has a right brachiocephalic fistula that was placed in April 2023 in preparation for possible dialysis need.  She has not yet required dialysis.  Patient had cardiology evaluation at [facility] as part of renal transplant evaluation.  Last seen by Dr. Grier Rocher 04/24/2022.  Per note, "-I personally reviewed the echo and PET stress. -Echo with concentric LVH (I didn't appreciate SAM) with normal LV and RV function but with trace pericardial effusion -PET stress without fixed or inducible defect. She has globally reduced CFR probably from microvascular disease. -This would not preclude her form transplant. Treatment is risk factor modification and medical treatment -Increase lipitor to 40 mg, coreg to 25 from 12.5 mg and consider d/c clonidine (she is not sure if she is on clonidine) -Continue losartan per nephrology -OK to continue ASA, but can be stopped if needed. -On Trulicity -Discussed wt loss -Check Lpa -Consider checking for OSA and home BP monitoring. F/u PRN"  Patient is on once weekly GLP-1 agonist Ozempic, last dose reported 02/11/2023.  DM2 reasonably well-controlled, A1c 7.3 on preop labs.  Preop labs reviewed, creatinine elevated at 3.74 consistent with history of CKD, chronic anemia with hemoglobin 9.8, mild hyperkalemia potassium 5.5.  Will plan to repeat i-STAT day of surgery due to elevated potassium.  EKG 04/24/2022 (Care Everywhere): NSR.  Rate 74.  TTE 02/04/2022 (Care Everywhere): SUMMARY  The left ventricular size is normal. Mild left ventricular  hypertrophy. The left ventricle is hyperdynamic. LV ejection fraction  = 65-70%.   LV hypertrophy noted of all segments   1.2cm  with superimposed  asymmetric nature  septum is 1.6cm . Mild systolic anterior motion of  the mitral valve is noted.   There is no comparison study  available.  There is no significant valvular stenosis or regurgitation.  The right ventricle is normal in size and function.   Cardiac PET stress test 02/04/2022 (Care Everywhere): 1.  Small fixed perfusion defect involving the distal-mid lateral wall segments, with small amount of peri-infarct ischemia.  2.  Normal left ventricular ejection fraction.  3.  Total coronary flow reserve: 1.57. Globally diminished coronary flow reserve, likely represents significant microvascular disease.  4.  Ancillary CT findings as above.     Wynonia Musty [facility]/Anesthesiology Phone [phone] 02/18/2023 4:31 PM

## 2023-02-20 ENCOUNTER — Other Ambulatory Visit: Payer: Self-pay

## 2023-02-20 ENCOUNTER — Encounter (HOSPITAL_COMMUNITY)
Admission: RE | Disposition: A | Discharge status: Home / Self Care | Payer: Self-pay | Source: Home / Self Care | Attending: Otolaryngology

## 2023-02-20 ENCOUNTER — Ambulatory Visit (HOSPITAL_COMMUNITY): Payer: Medicare HMO | Admitting: Physician Assistant

## 2023-02-20 ENCOUNTER — Ambulatory Visit (HOSPITAL_COMMUNITY)
Admission: RE | Admit: 2023-02-20 | Discharge: 2023-02-20 | Disposition: A | Discharge status: Home / Self Care | Payer: Medicare HMO | Source: Home / Self Care | Attending: Otolaryngology | Admitting: Otolaryngology

## 2023-02-20 ENCOUNTER — Ambulatory Visit (HOSPITAL_BASED_OUTPATIENT_CLINIC_OR_DEPARTMENT_OTHER): Payer: Medicare HMO | Admitting: Physician Assistant

## 2023-02-20 ENCOUNTER — Encounter (HOSPITAL_COMMUNITY): Payer: Self-pay | Admitting: Otolaryngology

## 2023-02-20 DIAGNOSIS — Z8673 Personal history of transient ischemic attack (TIA), and cerebral infarction without residual deficits: Secondary | ICD-10-CM | POA: Insufficient documentation

## 2023-02-20 DIAGNOSIS — J342 Deviated nasal septum: Secondary | ICD-10-CM | POA: Insufficient documentation

## 2023-02-20 DIAGNOSIS — J343 Hypertrophy of nasal turbinates: Secondary | ICD-10-CM | POA: Insufficient documentation

## 2023-02-20 DIAGNOSIS — J310 Chronic rhinitis: Secondary | ICD-10-CM | POA: Insufficient documentation

## 2023-02-20 DIAGNOSIS — J3489 Other specified disorders of nose and nasal sinuses: Secondary | ICD-10-CM

## 2023-02-20 DIAGNOSIS — I10 Essential (primary) hypertension: Secondary | ICD-10-CM | POA: Insufficient documentation

## 2023-02-20 DIAGNOSIS — E669 Obesity, unspecified: Secondary | ICD-10-CM | POA: Insufficient documentation

## 2023-02-20 DIAGNOSIS — Z79899 Other long term (current) drug therapy: Secondary | ICD-10-CM | POA: Insufficient documentation

## 2023-02-20 DIAGNOSIS — E875 Hyperkalemia: Secondary | ICD-10-CM | POA: Diagnosis not present

## 2023-02-20 DIAGNOSIS — Z6837 Body mass index (BMI) 37.0-37.9, adult: Secondary | ICD-10-CM | POA: Insufficient documentation

## 2023-02-20 DIAGNOSIS — E119 Type 2 diabetes mellitus without complications: Secondary | ICD-10-CM | POA: Insufficient documentation

## 2023-02-20 DIAGNOSIS — R7989 Other specified abnormal findings of blood chemistry: Secondary | ICD-10-CM

## 2023-02-20 HISTORY — PX: NASAL SEPTOPLASTY W/ TURBINOPLASTY: SHX2070

## 2023-02-20 LAB — POCT I-STAT, CHEM 8
BUN: 49 mg/dL — ABNORMAL HIGH (ref 6–20)
Calcium, Ion: 1.21 mmol/L (ref 1.15–1.40)
Chloride: 113 mmol/L — ABNORMAL HIGH (ref 98–111)
Creatinine, Ser: 4.1 mg/dL — ABNORMAL HIGH (ref 0.44–1.00)
Glucose, Bld: 166 mg/dL — ABNORMAL HIGH (ref 70–99)
HCT: 32 % — ABNORMAL LOW (ref 36.0–46.0)
Hemoglobin: 10.9 g/dL — ABNORMAL LOW (ref 12.0–15.0)
Potassium: 5.2 mmol/L — ABNORMAL HIGH (ref 3.5–5.1)
Sodium: 142 mmol/L (ref 135–145)
TCO2: 20 mmol/L — ABNORMAL LOW (ref 22–32)

## 2023-02-20 LAB — GLUCOSE, CAPILLARY
Glucose-Capillary: 158 mg/dL — ABNORMAL HIGH (ref 70–99)
Glucose-Capillary: 148 mg/dL — ABNORMAL HIGH (ref 70–99)
Glucose-Capillary: 154 mg/dL — ABNORMAL HIGH (ref 70–99)

## 2023-02-20 SURGERY — SEPTOPLASTY, NOSE, WITH NASAL TURBINATE REDUCTION
Anesthesia: General | Site: Nose

## 2023-02-20 MED ORDER — MIDAZOLAM HCL 2 MG/2ML IJ SOLN
INTRAMUSCULAR | Status: DC | PRN
  Administered 2023-02-20: 2 mg via INTRAVENOUS

## 2023-02-20 MED ORDER — SODIUM CHLORIDE 0.9 % IV SOLN: INTRAVENOUS | Status: DC

## 2023-02-20 MED ORDER — INSULIN ASPART 100 UNIT/ML IJ SOLN: 0.0000 [IU] | INTRAMUSCULAR | Status: DC | PRN

## 2023-02-20 MED ORDER — HYDROMORPHONE HCL 1 MG/ML IJ SOLN
0.2500 mg | INTRAMUSCULAR | Status: DC | PRN
  Administered 2023-02-20: 0.5 mg via INTRAVENOUS
  Administered 2023-02-20 (×2): 0.25 mg via INTRAVENOUS
  Administered 2023-02-20: 0.5 mg via INTRAVENOUS

## 2023-02-20 MED ORDER — OXYCODONE HCL 5 MG/5ML PO SOLN
5.0000 mg | Freq: Once | ORAL | Status: AC | PRN
  Administered 2023-02-20: 5 mg via ORAL

## 2023-02-20 MED ORDER — 0.9 % SODIUM CHLORIDE (POUR BTL) OPTIME
TOPICAL | Status: DC | PRN
  Administered 2023-02-20: 1000 mL

## 2023-02-20 MED ORDER — ONDANSETRON HCL 4 MG/2ML IJ SOLN
INTRAMUSCULAR | Status: DC | PRN
  Administered 2023-02-20: 4 mg via INTRAVENOUS

## 2023-02-20 MED ORDER — SUGAMMADEX SODIUM 200 MG/2ML IV SOLN
INTRAVENOUS | Status: DC | PRN
  Administered 2023-02-20: 300 mg via INTRAVENOUS

## 2023-02-20 MED ORDER — LIDOCAINE-EPINEPHRINE 1 %-1:100000 IJ SOLN
INTRAMUSCULAR | Status: AC
  Filled 2023-02-20: qty 1

## 2023-02-20 MED ORDER — BACITRACIN ZINC 500 UNIT/GM EX OINT
TOPICAL_OINTMENT | CUTANEOUS | Status: DC | PRN
  Administered 2023-02-20: 1 via TOPICAL

## 2023-02-20 MED ORDER — LIDOCAINE 2% (20 MG/ML) 5 ML SYRINGE
INTRAMUSCULAR | Status: DC | PRN
  Administered 2023-02-20: 80 mg via INTRAVENOUS

## 2023-02-20 MED ORDER — ONDANSETRON HCL 4 MG/2ML IJ SOLN
INTRAMUSCULAR | Status: AC
  Filled 2023-02-20: qty 2

## 2023-02-20 MED ORDER — ORAL CARE MOUTH RINSE: 15.0000 mL | Freq: Once | OROMUCOSAL | Status: AC

## 2023-02-20 MED ORDER — DEXAMETHASONE SODIUM PHOSPHATE 10 MG/ML IJ SOLN
INTRAMUSCULAR | Status: DC | PRN
  Administered 2023-02-20: 5 mg via INTRAVENOUS

## 2023-02-20 MED ORDER — BACITRACIN ZINC 500 UNIT/GM EX OINT
TOPICAL_OINTMENT | CUTANEOUS | Status: AC
  Filled 2023-02-20: qty 28.35

## 2023-02-20 MED ORDER — ACETAMINOPHEN 10 MG/ML IV SOLN
INTRAVENOUS | Status: AC
  Filled 2023-02-20: qty 100

## 2023-02-20 MED ORDER — OXYMETAZOLINE HCL 0.05 % NA SOLN
NASAL | Status: DC | PRN
  Administered 2023-02-20: 1 via TOPICAL

## 2023-02-20 MED ORDER — LIDOCAINE-EPINEPHRINE 1 %-1:100000 IJ SOLN
INTRAMUSCULAR | Status: DC | PRN
  Administered 2023-02-20: 6 mL

## 2023-02-20 MED ORDER — OXYCODONE HCL 5 MG PO TABS: 5.0000 mg | ORAL_TABLET | Freq: Once | ORAL | Status: AC | PRN

## 2023-02-20 MED ORDER — ROCURONIUM BROMIDE 10 MG/ML (PF) SYRINGE
PREFILLED_SYRINGE | INTRAVENOUS | Status: DC | PRN
  Administered 2023-02-20: 50 mg via INTRAVENOUS

## 2023-02-20 MED ORDER — LACTATED RINGERS IV SOLN: INTRAVENOUS | Status: DC

## 2023-02-20 MED ORDER — OXYCODONE HCL 5 MG/5ML PO SOLN
ORAL | Status: AC
  Filled 2023-02-20: qty 5

## 2023-02-20 MED ORDER — ACETAMINOPHEN 10 MG/ML IV SOLN
INTRAVENOUS | Status: DC | PRN
  Administered 2023-02-20: 1000 mg via INTRAVENOUS

## 2023-02-20 MED ORDER — FENTANYL CITRATE (PF) 250 MCG/5ML IJ SOLN
INTRAMUSCULAR | Status: DC | PRN
  Administered 2023-02-20: 50 ug via INTRAVENOUS

## 2023-02-20 MED ORDER — PROPOFOL 10 MG/ML IV BOLUS
INTRAVENOUS | Status: AC
  Filled 2023-02-20: qty 20

## 2023-02-20 MED ORDER — HYDROMORPHONE HCL 1 MG/ML IJ SOLN
INTRAMUSCULAR | Status: AC
  Filled 2023-02-20: qty 1

## 2023-02-20 MED ORDER — MIDAZOLAM HCL 2 MG/2ML IJ SOLN
INTRAMUSCULAR | Status: AC
  Filled 2023-02-20: qty 2

## 2023-02-20 MED ORDER — DEXAMETHASONE SODIUM PHOSPHATE 10 MG/ML IJ SOLN
INTRAMUSCULAR | Status: AC
  Filled 2023-02-20: qty 6

## 2023-02-20 MED ORDER — DIPHENHYDRAMINE HCL 50 MG/ML IJ SOLN
INTRAMUSCULAR | Status: AC
  Filled 2023-02-20: qty 1

## 2023-02-20 MED ORDER — AMOXICILLIN 875 MG PO TABS: 875.0000 mg | ORAL_TABLET | Freq: Two times a day (BID) | ORAL | 0 refills | Status: DC

## 2023-02-20 MED ORDER — CEFAZOLIN SODIUM-DEXTROSE 2-3 GM-%(50ML) IV SOLR
INTRAVENOUS | Status: DC | PRN
  Administered 2023-02-20: 2 g via INTRAVENOUS

## 2023-02-20 MED ORDER — PROPOFOL 10 MG/ML IV BOLUS
INTRAVENOUS | Status: DC | PRN
  Administered 2023-02-20: 130 mg via INTRAVENOUS

## 2023-02-20 MED ORDER — OXYMETAZOLINE HCL 0.05 % NA SOLN
NASAL | Status: AC
  Filled 2023-02-20: qty 30

## 2023-02-20 MED ORDER — DIPHENHYDRAMINE HCL 50 MG/ML IJ SOLN
25.0000 mg | Freq: Once | INTRAMUSCULAR | Status: AC
  Administered 2023-02-20: 25 mg via INTRAVENOUS

## 2023-02-20 MED ORDER — CHLORHEXIDINE GLUCONATE 0.12 % MT SOLN
15.0000 mL | Freq: Once | OROMUCOSAL | Status: AC
  Administered 2023-02-20: 15 mL via OROMUCOSAL
  Filled 2023-02-20: qty 15

## 2023-02-20 MED ORDER — CEFAZOLIN SODIUM 1 G IJ SOLR
INTRAMUSCULAR | Status: AC
  Filled 2023-02-20: qty 20

## 2023-02-20 MED ORDER — FENTANYL CITRATE (PF) 250 MCG/5ML IJ SOLN
INTRAMUSCULAR | Status: AC
  Filled 2023-02-20: qty 5

## 2023-02-20 MED ORDER — ONDANSETRON HCL 4 MG/2ML IJ SOLN: 4.0000 mg | Freq: Once | INTRAMUSCULAR | Status: DC | PRN

## 2023-02-20 MED ORDER — OXYCODONE-ACETAMINOPHEN 5-325 MG PO TABS: 1.0000 | ORAL_TABLET | Freq: Four times a day (QID) | ORAL | 0 refills | Status: DC | PRN

## 2023-02-20 SURGICAL SUPPLY — 27 items
BAG COUNTER SPONGE SURGICOUNT (BAG) ×1 IMPLANT
BAG SPNG CNTER NS LX DISP (BAG)
CANISTER SUCT 3000ML PPV (MISCELLANEOUS) ×1 IMPLANT
COAGULATOR SUCT SWTCH 10FR 6 (ELECTROSURGICAL) ×1 IMPLANT
COVER SURGICAL LIGHT HANDLE (MISCELLANEOUS) IMPLANT
DRAPE HALF SHEET 40X57 (DRAPES) IMPLANT
ELECT REM PT RETURN 9FT ADLT (ELECTROSURGICAL) ×1
ELECTRODE REM PT RTRN 9FT ADLT (ELECTROSURGICAL) ×1 IMPLANT
GAUZE SPONGE 2X2 8PLY STRL LF (GAUZE/BANDAGES/DRESSINGS) ×1 IMPLANT
GLOVE ECLIPSE 7.5 STRL STRAW (GLOVE) ×1 IMPLANT
GOWN STRL REUS W/ TWL LRG LVL3 (GOWN DISPOSABLE) ×2 IMPLANT
GOWN STRL REUS W/TWL LRG LVL3 (GOWN DISPOSABLE) ×2
KIT BASIN OR (CUSTOM PROCEDURE TRAY) ×1 IMPLANT
KIT TURNOVER KIT B (KITS) ×1 IMPLANT
NDL HYPO 25GX1X1/2 BEV (NEEDLE) ×1 IMPLANT
NEEDLE HYPO 25GX1X1/2 BEV (NEEDLE) ×1 IMPLANT
NS IRRIG 1000ML POUR BTL (IV SOLUTION) ×1 IMPLANT
PAD ARMBOARD 7.5X6 YLW CONV (MISCELLANEOUS) ×2 IMPLANT
SPLINT NASAL AIRWAY SILICONE (MISCELLANEOUS) IMPLANT
SPLINT NASAL DOYLE BI-VL (GAUZE/BANDAGES/DRESSINGS) ×1 IMPLANT
SPONGE NEURO XRAY DETECT 1X3 (DISPOSABLE) ×1 IMPLANT
SUT CHROMIC 4 0 SH 27 (SUTURE) ×1 IMPLANT
SUT PLAIN 4 0 ~~LOC~~ 1 (SUTURE) ×1 IMPLANT
SUT PROLENE 3 0 PS 2 (SUTURE) ×1 IMPLANT
TRAY ENT MC OR (CUSTOM PROCEDURE TRAY) ×1 IMPLANT
TUBE SALEM SUMP 16F (TUBING) IMPLANT
TUBING EXTENTION W/L.L. (IV SETS) IMPLANT

## 2023-02-20 NOTE — Transfer of Care (Signed)
Immediate Anesthesia Transfer of Care Note  Patient: Sarah Kline  Procedure(s) Performed: NASAL SEPTOPLASTY WITH BILATERAL TURBINATE REDUCTION (Nose)  Patient Location: PACU  Anesthesia Type:General  Level of Consciousness: awake and alert   Airway & Oxygen Therapy: Patient Spontanous Breathing and Patient connected to face mask oxygen  Post-op Assessment: Report given to RN and Post -op Vital signs reviewed and stable  Post vital signs: Reviewed and stable  Last Vitals:  Vitals Value Taken Time  BP 147/76 02/20/23 1500  Temp 36.5 C 02/20/23 1456  Pulse 52 02/20/23 1504  Resp 18 02/20/23 1504  SpO2 94 % 02/20/23 1504  Vitals shown include unvalidated device data.  Last Pain:  Vitals:   02/20/23 1109  TempSrc:   PainSc: 0-No pain         Complications: No notable events documented.

## 2023-02-20 NOTE — Anesthesia Postprocedure Evaluation (Signed)
Anesthesia Post Note  Patient: Sarah Kline  Procedure(s) Performed: NASAL SEPTOPLASTY WITH BILATERAL TURBINATE REDUCTION (Nose)     Patient location during evaluation: PACU Anesthesia Type: General Level of consciousness: awake and alert, oriented and patient cooperative Pain management: pain level controlled Vital Signs Assessment: post-procedure vital signs reviewed and stable Respiratory status: spontaneous breathing, nonlabored ventilation and respiratory function stable Cardiovascular status: blood pressure returned to baseline and stable Postop Assessment: no apparent nausea or vomiting Anesthetic complications: no   No notable events documented.  Last Vitals:  Vitals:   02/20/23 1456 02/20/23 1500  BP: (!) 143/82 (!) 147/76  Pulse: (!) 53 (!) 52  Resp: 19 17  Temp: 36.5 C   SpO2: 94% 91%    Last Pain:  Vitals:   02/20/23 1109  TempSrc:   PainSc: 0-No pain                 Pervis Hocking

## 2023-02-20 NOTE — Anesthesia Procedure Notes (Signed)
Procedure Name: Intubation Date/Time: 02/20/2023 1:57 PM  Performed by: Inda Coke, CRNAPre-anesthesia Checklist: Patient identified, Emergency Drugs available, Suction available, Timeout performed and Patient being monitored Patient Re-evaluated:Patient Re-evaluated prior to induction Oxygen Delivery Method: Circle system utilized Preoxygenation: Pre-oxygenation with 100% oxygen Induction Type: IV induction Ventilation: Mask ventilation without difficulty Laryngoscope Size: Mac and 3 Grade View: Grade I Tube type: Oral Tube size: 7.0 mm Number of attempts: 1 Airway Equipment and Method: Stylet Placement Confirmation: ETT inserted through vocal cords under direct vision, positive ETCO2, CO2 detector and breath sounds checked- equal and bilateral Secured at: 22 cm Tube secured with: Tape Dental Injury: Teeth and Oropharynx as per pre-operative assessment

## 2023-02-20 NOTE — H&P (Signed)
Cc: Chronic nasal obstruction  HPI: The patient is a 57 year old female who returns today for her follow-up evaluation. The patient was last seen 1 month ago.  At that time, she was complaining of chronic nasal obstruction.  She was noted to have nasal mucosal congestion, nasal septal deviation and bilateral inferior turbinate hypertrophy.  The patient was treated with Flonase nasal spray and nasal saline irrigation.  She returns today complaining of persistent nasal obstruction. She has not noted any significant improvement with the medical therapy.  She currently denies any facial pain, fever, or visual change.   Exam: General: Communicates without difficulty, well nourished, no acute distress. Head: Normocephalic, no evidence injury, no tenderness, facial buttresses intact without stepoff. Face/sinus: No tenderness to palpation and percussion. Facial movement is normal and symmetric. Eyes: PERRL, EOMI. No scleral icterus, conjunctivae clear. No nystagmus at any point of gaze. Ears: Auricles well formed without lesions.  Ear canals are intact without mass or lesion.  No erythema or edema is appreciated.  The TMs are intact without fluid. Nose: External evaluation reveals normal support and skin without lesions.  Dorsum is intact.  Anterior rhinoscopy reveals congested mucosa over anterior aspect of inferior turbinates and intact septum.  No purulence noted. Oral:  Oral cavity and oropharynx are intact, symmetric, without erythema or edema.  Mucosa is moist without lesions. Neck: Full range of motion without pain.  There is no significant lymphadenopathy.  No masses palpable.  Thyroid bed within normal limits to palpation.  Parotid glands and submandibular glands equal bilaterally without mass.  Trachea is midline. Neuro:  CN 2-12 grossly intact. A flexible scope was inserted into the right nasal cavity.  Endoscopy of the interior nasal cavity, superior, inferior, and middle meatus was performed. The  sphenoid-ethmoid recess was examined. Edematous mucosa was noted.  No polyp, mass, or lesion was appreciated. Nasal septal deviation noted.  Olfactory cleft was clear.  Nasopharynx was clear.  Turbinates were hypertrophied but without mass. The procedure was repeated on the contralateral side with similar findings.  The patient tolerated the procedure well.  Assessment: 1.  Chronic rhinitis with nasal mucosal congestion, nasal septal deviation and bilateral inferior turbinate hypertrophy.  More than 95% of her nasal passageways are obstructed bilaterally.   2.  The patient has not responded to medical treatment so far.   Plan: 1.  The nasal endoscopy findings are reviewed with the patient.   2.  Continue with flonase nasal spray 2 sprays each nostril daily.   3.  In light of her persistent symptoms she may benefit from surgical intervention with septoplasty and turbinate reduction.  The risks, benefits, alternatives and details of the procedures are reviewed with the patient.  Questions are invited and answered.  4.  The patient would like to proceed with the procedures.

## 2023-02-20 NOTE — Discharge Instructions (Addendum)

## 2023-02-20 NOTE — Op Note (Signed)

## 2023-02-21 ENCOUNTER — Inpatient Hospital Stay (HOSPITAL_COMMUNITY)
Admission: EM | Admit: 2023-02-21 | Discharge: 2023-02-27 | DRG: 143 | Disposition: A | Discharge status: Home / Self Care | Payer: Medicare HMO | Attending: Internal Medicine | Admitting: Internal Medicine

## 2023-02-21 ENCOUNTER — Emergency Department (HOSPITAL_COMMUNITY): Payer: Medicare HMO

## 2023-02-21 ENCOUNTER — Other Ambulatory Visit: Payer: Self-pay

## 2023-02-21 ENCOUNTER — Inpatient Hospital Stay (HOSPITAL_COMMUNITY): Payer: Medicare HMO

## 2023-02-21 ENCOUNTER — Encounter (HOSPITAL_COMMUNITY): Payer: Self-pay | Admitting: Otolaryngology

## 2023-02-21 DIAGNOSIS — I5033 Acute on chronic diastolic (congestive) heart failure: Secondary | ICD-10-CM | POA: Diagnosis present

## 2023-02-21 DIAGNOSIS — D0512 Intraductal carcinoma in situ of left breast: Secondary | ICD-10-CM | POA: Diagnosis present

## 2023-02-21 DIAGNOSIS — D631 Anemia in chronic kidney disease: Secondary | ICD-10-CM | POA: Diagnosis present

## 2023-02-21 DIAGNOSIS — J343 Hypertrophy of nasal turbinates: Secondary | ICD-10-CM | POA: Diagnosis present

## 2023-02-21 DIAGNOSIS — Z823 Family history of stroke: Secondary | ICD-10-CM

## 2023-02-21 DIAGNOSIS — I132 Hypertensive heart and chronic kidney disease with heart failure and with stage 5 chronic kidney disease, or end stage renal disease: Secondary | ICD-10-CM | POA: Diagnosis present

## 2023-02-21 DIAGNOSIS — E785 Hyperlipidemia, unspecified: Secondary | ICD-10-CM | POA: Diagnosis present

## 2023-02-21 DIAGNOSIS — N179 Acute kidney failure, unspecified: Secondary | ICD-10-CM | POA: Diagnosis present

## 2023-02-21 DIAGNOSIS — J811 Chronic pulmonary edema: Secondary | ICD-10-CM | POA: Diagnosis not present

## 2023-02-21 DIAGNOSIS — J342 Deviated nasal septum: Secondary | ICD-10-CM | POA: Diagnosis present

## 2023-02-21 DIAGNOSIS — E1165 Type 2 diabetes mellitus with hyperglycemia: Secondary | ICD-10-CM | POA: Diagnosis present

## 2023-02-21 DIAGNOSIS — Z7985 Long-term (current) use of injectable non-insulin antidiabetic drugs: Secondary | ICD-10-CM

## 2023-02-21 DIAGNOSIS — Z79899 Other long term (current) drug therapy: Secondary | ICD-10-CM | POA: Diagnosis not present

## 2023-02-21 DIAGNOSIS — Z79810 Long term (current) use of selective estrogen receptor modulators (SERMs): Secondary | ICD-10-CM

## 2023-02-21 DIAGNOSIS — E8770 Fluid overload, unspecified: Secondary | ICD-10-CM

## 2023-02-21 DIAGNOSIS — D573 Sickle-cell trait: Secondary | ICD-10-CM | POA: Diagnosis present

## 2023-02-21 DIAGNOSIS — E669 Obesity, unspecified: Secondary | ICD-10-CM | POA: Diagnosis present

## 2023-02-21 DIAGNOSIS — E1122 Type 2 diabetes mellitus with diabetic chronic kidney disease: Secondary | ICD-10-CM | POA: Diagnosis present

## 2023-02-21 DIAGNOSIS — Z8673 Personal history of transient ischemic attack (TIA), and cerebral infarction without residual deficits: Secondary | ICD-10-CM

## 2023-02-21 DIAGNOSIS — N289 Disorder of kidney and ureter, unspecified: Secondary | ICD-10-CM

## 2023-02-21 DIAGNOSIS — E875 Hyperkalemia: Secondary | ICD-10-CM | POA: Insufficient documentation

## 2023-02-21 DIAGNOSIS — I10 Essential (primary) hypertension: Secondary | ICD-10-CM | POA: Diagnosis present

## 2023-02-21 DIAGNOSIS — H548 Legal blindness, as defined in USA: Secondary | ICD-10-CM | POA: Diagnosis present

## 2023-02-21 DIAGNOSIS — J810 Acute pulmonary edema: Secondary | ICD-10-CM | POA: Diagnosis not present

## 2023-02-21 DIAGNOSIS — J9601 Acute respiratory failure with hypoxia: Secondary | ICD-10-CM

## 2023-02-21 DIAGNOSIS — I5030 Unspecified diastolic (congestive) heart failure: Secondary | ICD-10-CM | POA: Diagnosis not present

## 2023-02-21 DIAGNOSIS — R001 Bradycardia, unspecified: Secondary | ICD-10-CM | POA: Insufficient documentation

## 2023-02-21 DIAGNOSIS — E872 Acidosis, unspecified: Secondary | ICD-10-CM | POA: Diagnosis not present

## 2023-02-21 DIAGNOSIS — Z6833 Body mass index (BMI) 33.0-33.9, adult: Secondary | ICD-10-CM

## 2023-02-21 DIAGNOSIS — I272 Pulmonary hypertension, unspecified: Secondary | ICD-10-CM | POA: Diagnosis not present

## 2023-02-21 DIAGNOSIS — E1169 Type 2 diabetes mellitus with other specified complication: Secondary | ICD-10-CM | POA: Diagnosis present

## 2023-02-21 DIAGNOSIS — N185 Chronic kidney disease, stage 5: Secondary | ICD-10-CM | POA: Diagnosis present

## 2023-02-21 DIAGNOSIS — R0902 Hypoxemia: Secondary | ICD-10-CM

## 2023-02-21 DIAGNOSIS — E871 Hypo-osmolality and hyponatremia: Secondary | ICD-10-CM | POA: Diagnosis not present

## 2023-02-21 DIAGNOSIS — Z803 Family history of malignant neoplasm of breast: Secondary | ICD-10-CM

## 2023-02-21 DIAGNOSIS — K5900 Constipation, unspecified: Secondary | ICD-10-CM | POA: Diagnosis not present

## 2023-02-21 DIAGNOSIS — Z833 Family history of diabetes mellitus: Secondary | ICD-10-CM

## 2023-02-21 DIAGNOSIS — I517 Cardiomegaly: Secondary | ICD-10-CM | POA: Diagnosis not present

## 2023-02-21 DIAGNOSIS — R040 Epistaxis: Secondary | ICD-10-CM | POA: Diagnosis not present

## 2023-02-21 DIAGNOSIS — E8779 Other fluid overload: Secondary | ICD-10-CM | POA: Diagnosis not present

## 2023-02-21 DIAGNOSIS — Z8249 Family history of ischemic heart disease and other diseases of the circulatory system: Secondary | ICD-10-CM

## 2023-02-21 DIAGNOSIS — Z923 Personal history of irradiation: Secondary | ICD-10-CM

## 2023-02-21 HISTORY — DX: Acute kidney failure, unspecified: N17.9

## 2023-02-21 LAB — URINALYSIS, ROUTINE W REFLEX MICROSCOPIC
Bacteria, UA: NONE SEEN
Bilirubin Urine: NEGATIVE
Glucose, UA: NEGATIVE mg/dL
Hgb urine dipstick: NEGATIVE
Ketones, ur: NEGATIVE mg/dL
Leukocytes,Ua: NEGATIVE
Nitrite: NEGATIVE
Protein, ur: 30 mg/dL — AB
Specific Gravity, Urine: 1.01 (ref 1.005–1.030)
pH: 5 (ref 5.0–8.0)

## 2023-02-21 LAB — BLOOD GAS, VENOUS
Acid-base deficit: 10.2 mmol/L — ABNORMAL HIGH (ref 0.0–2.0)
Bicarbonate: 16.8 mmol/L — ABNORMAL LOW (ref 20.0–28.0)
Drawn by: 4653
O2 Saturation: 66.3 %
Patient temperature: 37
pCO2, Ven: 40 mmHg — ABNORMAL LOW (ref 44–60)
pH, Ven: 7.23 — ABNORMAL LOW (ref 7.25–7.43)
pO2, Ven: 38 mmHg (ref 32–45)

## 2023-02-21 LAB — CBC WITH DIFFERENTIAL/PLATELET
Abs Immature Granulocytes: 0.05 10*3/uL (ref 0.00–0.07)
Basophils Absolute: 0 10*3/uL (ref 0.0–0.1)
Basophils Relative: 0 %
Eosinophils Absolute: 0 10*3/uL (ref 0.0–0.5)
Eosinophils Relative: 0 %
HCT: 31.2 % — ABNORMAL LOW (ref 36.0–46.0)
Hemoglobin: 9.7 g/dL — ABNORMAL LOW (ref 12.0–15.0)
Immature Granulocytes: 1 %
Lymphocytes Relative: 4 %
Lymphs Abs: 0.3 10*3/uL — ABNORMAL LOW (ref 0.7–4.0)
MCH: 30 pg (ref 26.0–34.0)
MCHC: 31.1 g/dL (ref 30.0–36.0)
MCV: 96.6 fL (ref 80.0–100.0)
Monocytes Absolute: 0.4 10*3/uL (ref 0.1–1.0)
Monocytes Relative: 5 %
Neutro Abs: 7.2 10*3/uL (ref 1.7–7.7)
Neutrophils Relative %: 90 %
Platelets: 195 10*3/uL (ref 150–400)
RBC: 3.23 MIL/uL — ABNORMAL LOW (ref 3.87–5.11)
RDW: 14.6 % (ref 11.5–15.5)
WBC: 8 10*3/uL (ref 4.0–10.5)
nRBC: 0 % (ref 0.0–0.2)

## 2023-02-21 LAB — IRON AND TIBC
Iron: 50 ug/dL (ref 28–170)
Saturation Ratios: 14 % (ref 10.4–31.8)
TIBC: 353 ug/dL (ref 250–450)
UIBC: 303 ug/dL

## 2023-02-21 LAB — BASIC METABOLIC PANEL
Anion gap: 13 (ref 5–15)
BUN: 69 mg/dL — ABNORMAL HIGH (ref 6–20)
CO2: 17 mmol/L — ABNORMAL LOW (ref 22–32)
Calcium: 9 mg/dL (ref 8.9–10.3)
Chloride: 105 mmol/L (ref 98–111)
Creatinine, Ser: 4.76 mg/dL — ABNORMAL HIGH (ref 0.44–1.00)
GFR, Estimated: 10 mL/min — ABNORMAL LOW (ref >60)
Glucose, Bld: 245 mg/dL — ABNORMAL HIGH (ref 70–99)
Potassium: 6.5 mmol/L (ref 3.5–5.1)
Sodium: 135 mmol/L (ref 135–145)

## 2023-02-21 LAB — CBG MONITORING, ED: Glucose-Capillary: 204 mg/dL — ABNORMAL HIGH (ref 70–99)

## 2023-02-21 LAB — I-STAT VENOUS BLOOD GAS, ED
Acid-base deficit: 12 mmol/L — ABNORMAL HIGH (ref 0.0–2.0)
Bicarbonate: 14.7 mmol/L — ABNORMAL LOW (ref 20.0–28.0)
Calcium, Ion: 1.17 mmol/L (ref 1.15–1.40)
HCT: 31 % — ABNORMAL LOW (ref 36.0–46.0)
Hemoglobin: 10.5 g/dL — ABNORMAL LOW (ref 12.0–15.0)
O2 Saturation: 85 %
Potassium: 6 mmol/L — ABNORMAL HIGH (ref 3.5–5.1)
Sodium: 134 mmol/L — ABNORMAL LOW (ref 135–145)
TCO2: 16 mmol/L — ABNORMAL LOW (ref 22–32)
pCO2, Ven: 34.9 mmHg — ABNORMAL LOW (ref 44–60)
pH, Ven: 7.233 — ABNORMAL LOW (ref 7.25–7.43)
pO2, Ven: 59 mmHg — ABNORMAL HIGH (ref 32–45)

## 2023-02-21 LAB — GLUCOSE, CAPILLARY
Glucose-Capillary: 163 mg/dL — ABNORMAL HIGH (ref 70–99)
Glucose-Capillary: 208 mg/dL — ABNORMAL HIGH (ref 70–99)

## 2023-02-21 LAB — LACTIC ACID, PLASMA
Lactic Acid, Venous: 2.6 mmol/L (ref 0.5–1.9)
Lactic Acid, Venous: 2.5 mmol/L (ref 0.5–1.9)

## 2023-02-21 LAB — POTASSIUM: Potassium: 5.9 mmol/L — ABNORMAL HIGH (ref 3.5–5.1)

## 2023-02-21 LAB — PHOSPHORUS: Phosphorus: 7.4 mg/dL — ABNORMAL HIGH (ref 2.5–4.6)

## 2023-02-21 LAB — FERRITIN: Ferritin: 79 ng/mL (ref 11–307)

## 2023-02-21 LAB — MAGNESIUM: Magnesium: 2.1 mg/dL (ref 1.7–2.4)

## 2023-02-21 LAB — BRAIN NATRIURETIC PEPTIDE: B Natriuretic Peptide: 1115.9 pg/mL — ABNORMAL HIGH (ref 0.0–100.0)

## 2023-02-21 MED ORDER — ACETAMINOPHEN 650 MG RE SUPP: 650.0000 mg | Freq: Four times a day (QID) | RECTAL | Status: DC | PRN

## 2023-02-21 MED ORDER — FUROSEMIDE 10 MG/ML IJ SOLN
80.0000 mg | Freq: Once | INTRAMUSCULAR | Status: AC
  Administered 2023-02-21: 80 mg via INTRAVENOUS
  Filled 2023-02-21: qty 8

## 2023-02-21 MED ORDER — ALBUTEROL SULFATE HFA 108 (90 BASE) MCG/ACT IN AERS
2.0000 | INHALATION_SPRAY | Freq: Once | RESPIRATORY_TRACT | Status: AC
  Administered 2023-02-21: 2 via RESPIRATORY_TRACT
  Filled 2023-02-21: qty 6.7

## 2023-02-21 MED ORDER — CALCITRIOL 0.25 MCG PO CAPS
0.2500 ug | ORAL_CAPSULE | ORAL | Status: DC
  Administered 2023-02-23 – 2023-02-27 (×3): 0.25 ug via ORAL
  Filled 2023-02-21 (×4): qty 1

## 2023-02-21 MED ORDER — AMOXICILLIN 500 MG PO CAPS
500.0000 mg | ORAL_CAPSULE | Freq: Two times a day (BID) | ORAL | Status: AC
  Administered 2023-02-21 – 2023-02-22 (×4): 500 mg via ORAL
  Filled 2023-02-21 (×4): qty 1

## 2023-02-21 MED ORDER — METOLAZONE 2.5 MG PO TABS
5.0000 mg | ORAL_TABLET | Freq: Every day | ORAL | Status: DC
  Administered 2023-02-21 – 2023-02-24 (×4): 5 mg via ORAL
  Filled 2023-02-21 (×4): qty 2

## 2023-02-21 MED ORDER — ATORVASTATIN CALCIUM 40 MG PO TABS
40.0000 mg | ORAL_TABLET | Freq: Every day | ORAL | Status: DC
  Administered 2023-02-22 – 2023-02-26 (×5): 40 mg via ORAL
  Filled 2023-02-21 (×6): qty 1

## 2023-02-21 MED ORDER — POLYETHYLENE GLYCOL 3350 17 G PO PACK: 17.0000 g | PACK | Freq: Every day | ORAL | Status: DC | PRN

## 2023-02-21 MED ORDER — SODIUM ZIRCONIUM CYCLOSILICATE 10 G PO PACK
10.0000 g | PACK | Freq: Once | ORAL | Status: AC
  Administered 2023-02-21: 10 g via ORAL
  Filled 2023-02-21: qty 1

## 2023-02-21 MED ORDER — SODIUM ZIRCONIUM CYCLOSILICATE 10 G PO PACK
10.0000 g | PACK | Freq: Three times a day (TID) | ORAL | Status: DC
  Administered 2023-02-21 – 2023-02-23 (×5): 10 g via ORAL
  Filled 2023-02-21 (×5): qty 1

## 2023-02-21 MED ORDER — FUROSEMIDE 10 MG/ML IJ SOLN
120.0000 mg | Freq: Three times a day (TID) | INTRAVENOUS | Status: DC
  Administered 2023-02-21 – 2023-02-22 (×2): 120 mg via INTRAVENOUS
  Filled 2023-02-21: qty 12
  Filled 2023-02-21: qty 10
  Filled 2023-02-21 (×2): qty 12
  Filled 2023-02-21: qty 10

## 2023-02-21 MED ORDER — HEPARIN SODIUM (PORCINE) 5000 UNIT/ML IJ SOLN
5000.0000 [IU] | Freq: Three times a day (TID) | INTRAMUSCULAR | Status: DC
  Administered 2023-02-21 – 2023-02-22 (×2): 5000 [IU] via SUBCUTANEOUS
  Filled 2023-02-21 (×3): qty 1

## 2023-02-21 MED ORDER — AMOXICILLIN 875 MG PO TABS: 875.0000 mg | ORAL_TABLET | Freq: Two times a day (BID) | ORAL | Status: DC

## 2023-02-21 MED ORDER — ACETAMINOPHEN 325 MG PO TABS
650.0000 mg | ORAL_TABLET | Freq: Four times a day (QID) | ORAL | Status: DC | PRN
  Administered 2023-02-23: 650 mg via ORAL
  Filled 2023-02-21: qty 2

## 2023-02-21 MED ORDER — INSULIN ASPART 100 UNIT/ML IJ SOLN
0.0000 [IU] | Freq: Three times a day (TID) | INTRAMUSCULAR | Status: DC
  Administered 2023-02-21: 2 [IU] via SUBCUTANEOUS
  Administered 2023-02-21: 3 [IU] via SUBCUTANEOUS
  Administered 2023-02-22 – 2023-02-23 (×5): 1 [IU] via SUBCUTANEOUS
  Administered 2023-02-24 – 2023-02-25 (×4): 2 [IU] via SUBCUTANEOUS
  Administered 2023-02-25: 3 [IU] via SUBCUTANEOUS
  Administered 2023-02-25 – 2023-02-26 (×2): 2 [IU] via SUBCUTANEOUS
  Administered 2023-02-26: 3 [IU] via SUBCUTANEOUS
  Administered 2023-02-26: 2 [IU] via SUBCUTANEOUS
  Administered 2023-02-27: 1 [IU] via SUBCUTANEOUS
  Administered 2023-02-27: 2 [IU] via SUBCUTANEOUS

## 2023-02-21 MED ORDER — SODIUM BICARBONATE 650 MG PO TABS
1300.0000 mg | ORAL_TABLET | Freq: Two times a day (BID) | ORAL | Status: DC
  Administered 2023-02-21 (×2): 1300 mg via ORAL
  Filled 2023-02-21 (×2): qty 2

## 2023-02-21 MED ORDER — TAMOXIFEN CITRATE 10 MG PO TABS
10.0000 mg | ORAL_TABLET | Freq: Every day | ORAL | Status: DC
  Administered 2023-02-21 – 2023-02-27 (×7): 10 mg via ORAL
  Filled 2023-02-21 (×7): qty 1

## 2023-02-21 NOTE — ED Notes (Signed)
Pt sats at 56 I informed RN Bland Span who is covering for assigned RN, Pt recently had a nasal surgery and is unable to have O2 by nose. Pt daughter is feeding her so there is a heightened likelihood that she will Sat low and then go up. She maintaining currently at [phone], I also informed RN Bland Span that I am unable to get BP due to NBP cuff error.

## 2023-02-21 NOTE — ED Notes (Signed)
ED TO INPATIENT HANDOFF REPORT  ED Nurse Name and Phone #: [phone]  S Name/Age/Gender Sarah Kline 57 y.o. female Room/Bed: 018C/018C  Code Status   Code Status: Full Code  Home/SNF/Other Home Patient oriented to: self, place, time, and situation Is this baseline? Yes   Triage Complete: Triage complete  Chief Complaint Hypervolemia associated with renal insufficiency [E87.70, N28.9] Acute renal failure superimposed on stage 5 chronic kidney disease, not on chronic dialysis, unspecified acute renal failure type ([place]) [N17.9, N18.5]  Triage Note Pt to ED from home via [place]. Pt states having nasal septum surgery yesterday and was discharged same day. Family member states pt was having difficulty answering questions when pt got home and pt's O2 saturation was low. Pt currently A/Ox4 and answering questions appropriately. O2 saturations are currently low to mid 90s on RA.    Allergies No Known Allergies  Level of Care/Admitting Diagnosis ED Disposition     ED Disposition  Admit   Condition  --   Comment  Hospital Area: [facility] [100100]  Level of Care: Progressive [102]  Admit to Progressive based on following criteria: CARDIOVASCULAR & THORACIC of moderate stability with acute coronary syndrome symptoms/low risk myocardial infarction/hypertensive urgency/arrhythmias/heart failure potentially compromising stability and stable post cardiovascular intervention patients.  Admit to Progressive based on following criteria: NEPHROLOGY stable condition requiring close monitoring for AKI, requiring Hemodialysis or Peritoneal Dialysis either from expected electrolyte imbalance, acidosis, or fluid overload that can be managed by NIPPV or high flow oxygen.  May admit patient to [facility] or [facility] if equivalent level of care is available:: No  Covid Evaluation: Asymptomatic - no recent exposure (last 10 days) testing not required  Diagnosis: Acute renal  failure superimposed on stage 5 chronic kidney disease, not on chronic dialysis, unspecified acute renal [id number]  Admitting Physician: Sid Falcon [phone]  Attending Physician: Sid Falcon [id number]  Certification:: I certify this patient will need inpatient services for at least 2 midnights  Estimated Length of Stay: 3          B Medical/Surgery History Past Medical History:  Diagnosis Date   Anemia    Blind    Both eyes   Breast cancer ([place])    Chronic kidney disease    Diabetes mellitus    Hypertension    Sickle cell disease, type S ([place])    Trait   Stroke ([place]) 2017   TIA   Past Surgical History:  Procedure Laterality Date   AV FISTULA PLACEMENT Right 03/04/2022   Procedure: RIGHT ARM BRACHIOCEPHALIC FISTULA CREATION;  Surgeon: Broadus John, MD;  Location: [place];  Service: Vascular;  Laterality: Right;  PERIPHERAL NERVE BLOCK   BREAST LUMPECTOMY WITH RADIOACTIVE SEED LOCALIZATION Left 05/27/2022   Procedure: LEFT BREAST LUMPECTOMY WITH RADIOACTIVE SEED LOCALIZATION;  Surgeon: Donnie Mesa, MD;  Location: [place];  Service: General;  Laterality: Left;  LMA   COLONOSCOPY     EYE SURGERY Left    retinal detachment repair 2017   EYE SURGERY Left    ? type of surgery to repair blood vessels 2018   GANGLION CYST EXCISION     Left dorsal wrist   NASAL SEPTOPLASTY W/ TURBINOPLASTY N/A 02/20/2023   Procedure: NASAL SEPTOPLASTY WITH BILATERAL TURBINATE REDUCTION;  Surgeon: Leta Baptist, MD;  Location: [place];  Service: ENT;  Laterality: N/A;   RETINAL DETACHMENT SURGERY Left    2017, [place]     A IV  Location/Drains/Wounds Patient Lines/Drains/Airways Status     Active Line/Drains/Airways     Name Placement date Placement time Site Days   Peripheral IV 02/21/23 20 G Left;Posterior Hand 02/21/23  0857  Hand  less than 1   Fistula / Graft Right Arteriovenous fistula 03/04/22  0751  --  354   Incision (Closed) 02/20/23 Nose Other (Comment)  02/20/23  1437  -- 1            Intake/Output Last 24 hours No intake or output data in the 24 hours ending 02/21/23 1257  Labs/Imaging Results for orders placed or performed during the hospital encounter of 02/21/23 (from the past 48 hour(s))  CBC with Differential     Status: Abnormal   Collection Time: 02/21/23  8:52 AM  Result Value Ref Range   WBC 8.0 4.0 - 10.5 K/uL   RBC 3.23 (L) 3.87 - 5.11 MIL/uL   Hemoglobin 9.7 (L) 12.0 - 15.0 g/dL   HCT 31.2 (L) 36.0 - 46.0 %   MCV 96.6 80.0 - 100.0 fL   MCH 30.0 26.0 - 34.0 pg   MCHC 31.1 30.0 - 36.0 g/dL   RDW 14.6 11.5 - 15.5 %   Platelets 195 150 - 400 K/uL   nRBC 0.0 0.0 - 0.2 %   Neutrophils Relative % 90 %   Neutro Abs 7.2 1.7 - 7.7 K/uL   Lymphocytes Relative 4 %   Lymphs Abs 0.3 (L) 0.7 - 4.0 K/uL   Monocytes Relative 5 %   Monocytes Absolute 0.4 0.1 - 1.0 K/uL   Eosinophils Relative 0 %   Eosinophils Absolute 0.0 0.0 - 0.5 K/uL   Basophils Relative 0 %   Basophils Absolute 0.0 0.0 - 0.1 K/uL   Immature Granulocytes 1 %   Abs Immature Granulocytes 0.05 0.00 - 0.07 K/uL    Comment: Performed at [facility], [address], [place], [place] [id number]  Basic metabolic panel     Status: Abnormal   Collection Time: 02/21/23  8:52 AM  Result Value Ref Range   Sodium 135 135 - 145 mmol/L   Potassium 6.5 (HH) 3.5 - 5.1 mmol/L    Comment: CRITICAL RESULT CALLED TO, READ BACK BY AND VERIFIED WITH J,Alizah Sills RN @0937  02/21/23 E,[place]   Chloride 105 98 - 111 mmol/L   CO2 17 (L) 22 - 32 mmol/L   Glucose, Bld 245 (H) 70 - 99 mg/dL    Comment: Glucose reference range applies only to samples taken after fasting for at least 8 hours.   BUN 69 (H) 6 - 20 mg/dL   Creatinine, Ser 4.76 (H) 0.44 - 1.00 mg/dL   Calcium 9.0 8.9 - 10.3 mg/dL   GFR, Estimated 10 (L) >60 mL/min    Comment: (NOTE) Calculated using the CKD-EPI Creatinine Equation (2021)    Anion gap 13 5 - 15    Comment: Performed at [facility] [address] [place], [place] [address]  Brain natriuretic peptide     Status: Abnormal   Collection Time: 02/21/23  8:52 AM  Result Value Ref Range   B Natriuretic Peptide 1,115.9 (H) 0.0 - 100.0 pg/mL    Comment: Performed at [facility] [address], [place], [place] [address]  CBG monitoring, ED     Status: Abnormal   Collection Time: 02/21/23 11:50 AM  Result Value Ref Range   Glucose-Capillary 204 (H) 70 - 99 mg/dL    Comment: Glucose reference range applies  only to samples taken after fasting for at least 8 hours.   DG Chest 2 View  Result Date: 02/21/2023 CLINICAL DATA:  Shortness of breath EXAM: CHEST - 2 VIEW COMPARISON:  05/07/2015 FINDINGS: Cardiomegaly and main pulmonary artery enlargement. The vascular hila also appear thickened. Interstitial opacity diffusely. No effusion or Kerley lines. No evidence of pneumonia or air leak. Extensive artifact from EKG leads. IMPRESSION: Cardiomegaly and vascular congestion/interstitial edema. Prominent enlargement of the main pulmonary artery suggesting pulmonary hypertension. Electronically Signed   By: Jorje Guild M.D.   On: 02/21/2023 09:17    Pending Labs Unresulted Labs (From admission, onward)     Start     Ordered   02/22/23 0500  CBC  Tomorrow morning,   R        02/21/23 1130   02/22/23 0500  Renal function panel  Tomorrow morning,   R        02/21/23 1130   02/22/23 0500  Protime-INR  Tomorrow morning,   R        02/21/23 1130   02/22/23 0500  APTT  Tomorrow morning,   R        02/21/23 1130   02/21/23 1300  Potassium  Once,   R        02/21/23 1137   02/21/23 1237  Ferritin  Once,   R        02/21/23 1236   02/21/23 1237  Iron and TIBC  Once,   R        02/21/23 1236   02/21/23 1227  Lactic acid, plasma  STAT Now then every 3 hours,   R (with STAT occurrences)      02/21/23 1226   02/21/23 1136  Urinalysis, Routine w reflex microscopic -Urine, Clean Catch  Once,   R       Question:  Specimen Source  Answer:   Urine, Clean Catch   02/21/23 1135   02/21/23 1130  Phosphorus  Add-on,   AD        02/21/23 1130   02/21/23 1130  Magnesium  Add-on,   AD        02/21/23 1130            Vitals/Pain Today's Vitals   02/21/23 1115 02/21/23 1130 02/21/23 1145 02/21/23 1245  BP: (!) 164/84 (!) 154/60 (!) 161/82   Pulse: 60 (!) 58 (!) 58 (!) 57  Resp: 18 18 18 15   Temp:      TempSrc:      SpO2: 100% 100% 100% 97%  Weight:      Height:      PainSc:        Isolation Precautions No active isolations  Medications Medications  insulin aspart (novoLOG) injection 0-9 Units (3 Units Subcutaneous Given 02/21/23 1156)  heparin injection 5,000 Units (has no administration in time range)  acetaminophen (TYLENOL) tablet 650 mg (has no administration in time range)    Or  acetaminophen (TYLENOL) suppository 650 mg (has no administration in time range)  polyethylene glycol (MIRALAX / GLYCOLAX) packet 17 g (has no administration in time range)  amoxicillin (AMOXIL) capsule 500 mg (500 mg Oral Given 02/21/23 1156)  atorvastatin (LIPITOR) tablet 40 mg (has no administration in time range)  tamoxifen (NOLVADEX) tablet 10 mg (10 mg Oral Given 02/21/23 1156)  sodium bicarbonate tablet 1,300 mg (has no administration in time range)  calcitRIOL (ROCALTROL) capsule 0.25 mcg (0 mcg Oral Hold 02/21/23 1156)  albuterol (VENTOLIN HFA) 108 (  90 Base) MCG/ACT inhaler 2 puff (2 puffs Inhalation Given 02/21/23 0838)  sodium zirconium cyclosilicate (LOKELMA) packet 10 g (10 g Oral Given 02/21/23 1019)  furosemide (LASIX) injection 80 mg (80 mg Intravenous Given 02/21/23 1019)    Mobility walks with person assist     Focused Assessments Pulmonary Assessment Handoff:  Lung sounds: Bilateral Breath Sounds: Diminished O2 Device: Room Air      R Recommendations: See Admitting Provider Note  Report given to:   Additional Notes: Pt is currently on Venti mask at 3L. Pt unable to use nasal cannula due to recent nasal  septum surgery.

## 2023-02-21 NOTE — Progress Notes (Signed)
ED RN notified this RN of critical lactic acid result of 2.6. MD paged with findings.

## 2023-02-21 NOTE — Progress Notes (Signed)
Routine MRSA swab not completed due to contraindication from recent Nasal Septoplasty and Nasal splint.

## 2023-02-21 NOTE — Hospital Course (Addendum)
03/25: Gave her benadryl after she had anesthesia because she was having a reactions. She does have low oxygen when she sleeping. Patient is alert and oriented x4. Patient feels her thinking is more clear. She denies any shortness of breath or chest pain. She denies any cough. She reports she is still having epistaxis. She does report some constipation. Explained plan to the patient and answered her questions.  Crackles bases And upper clear with poor inspiratory effort.   03/26: She does not feel short of breath. Denies any chest pain. She reports she has ben OOB to sit on the bedside commode. She has not been walking and is eager to do so.She states she has been urinating well but not a lot. Had a tiny bowel movement.She does report that she is eating well. Denies bleeding nose. She states that she did talk with the ENT doctor and the kidney doctor yesterday. Explained the plan to the patient and answered all of her questions.   03/27 She was up and walking yesterday with no concerns. She denies any chest pain or any cough. She reports that she is using the bathroom well. She has no other concerns this morning. Patient states that she did speak with the nephrologist who states they are just watching her labs.   03/29: Breathing is good. She is getting out of bed and it is going well. She reports she is peeing about 4-5 times a day. Not having too many bowel movements. The nephrology team stated that she will need labs this afternoon.

## 2023-02-21 NOTE — Consult Note (Signed)
Renal Service Consult Note [facility]  Sarah Kline 02/21/2023 Sol Blazing, MD Requesting Physician: Dr. Daryll Drown  Reason for Consult: Renal failure HPI: The patient is a 57 y.o. year-old w/ PMH as below who presented to ED this morning w/ family reporting altered mental status when pt got home from a nasal surgery procedure yesterday. In ED main pt c/o was SOB related possibly due to her septal deviation. Endorsed chronic DOE. Hx of CKD f/b Dr Joelyn Oms at [facility]. Dialysis AVF placed April 2023. In ED sats were mid 80s on RA, corrected w/ FM O2. HR 50s, Hb 9.7, K+ 6.5, glucose 245, creat 4.76. CXR showed vasc congestion.   Seen in hospital room. Pt seen in room. Main c/o is recent SOB. No leg swelling, no n/v/d, no abd pain and no confusion or AMS.  ROS - denies CP, no joint pain, no HA, no blurry vision, no rash, no diarrhea, no nausea/ vomiting, no dysuria, no difficulty voiding   Past Medical History  Past Medical History:  Diagnosis Date   Anemia    Blind    Both eyes   Breast cancer ([place])    Chronic kidney disease    Diabetes mellitus    Hypertension    Sickle cell disease, type S ([place])    Trait   Stroke ([place]) 2017   TIA   Past Surgical History  Past Surgical History:  Procedure Laterality Date   AV FISTULA PLACEMENT Right 03/04/2022   Procedure: RIGHT ARM BRACHIOCEPHALIC FISTULA CREATION;  Surgeon: Broadus John, MD;  Location: [place];  Service: Vascular;  Laterality: Right;  PERIPHERAL NERVE BLOCK   BREAST LUMPECTOMY WITH RADIOACTIVE SEED LOCALIZATION Left 05/27/2022   Procedure: LEFT BREAST LUMPECTOMY WITH RADIOACTIVE SEED LOCALIZATION;  Surgeon: Donnie Mesa, MD;  Location: [place];  Service: General;  Laterality: Left;  LMA   COLONOSCOPY     EYE SURGERY Left    retinal detachment repair 2017   EYE SURGERY Left    ? type of surgery to repair blood vessels 2018   GANGLION CYST EXCISION     Left dorsal wrist   NASAL SEPTOPLASTY W/  TURBINOPLASTY N/A 02/20/2023   Procedure: NASAL SEPTOPLASTY WITH BILATERAL TURBINATE REDUCTION;  Surgeon: Leta Baptist, MD;  Location: [facility];  Service: ENT;  Laterality: N/A;   RETINAL DETACHMENT SURGERY Left    2017, [place]   Family History  Family History  Problem Relation Age of Onset   Asthma Mother    COPD Mother    Hypertension Mother    Stroke Mother    Breast cancer Sister 24   Diabetes Daughter    Diabetes Son    Social History  reports that she has never smoked. She has never used smokeless tobacco. She reports that she does not currently use alcohol after a past usage of about 2.0 standard drinks of alcohol per week. She reports that she does not use drugs. Allergies No Known Allergies Home medications Prior to Admission medications   Medication Sig Start Date End Date Taking? Authorizing Provider  amLODipine (NORVASC) 10 MG tablet TAKE 1 TABLET EVERY EVENING 08/26/21   Simmons-Robinson, Riki Sheer, MD  amoxicillin (AMOXIL) 875 MG tablet Take 1 tablet (875 mg total) by mouth 2 (two) times daily for 3 days. 02/20/23 02/23/23  Leta Baptist, MD  atorvastatin (LIPITOR) 40 MG tablet Take 40 mg by mouth at bedtime.    [provider]  Blood Glucose Monitoring Suppl (PRODIGY AUTOCODE BLOOD GLUCOSE) w/Device  KIT 1 kit by Does not apply route as directed.    [provider]  calcitRIOL (ROCALTROL) 0.25 MCG capsule TAKE 1 CAPSULE EVERY DAY Patient taking differently: Take 0.25 mcg by mouth every other day. 08/26/21   Simmons-Robinson, Riki Sheer, MD  carvedilol (COREG) 25 MG tablet Take 25 mg by mouth 2 (two) times daily.    [provider]  losartan (COZAAR) 50 MG tablet Take 50 mg by mouth at bedtime. 04/08/21   [provider]  oxyCODONE-acetaminophen (PERCOCET) 5-325 MG tablet Take 1 tablet by mouth every 6 (six) hours as needed for up to 3 days for severe pain. 02/20/23 02/23/23  Leta Baptist, MD  sodium bicarbonate 650 MG tablet Take 1,300 mg by mouth 2 (two) times  daily. 06/02/18   [provider]  tamoxifen (NOLVADEX) 10 MG tablet Take 1 tablet (10 mg total) by mouth daily. 08/01/22   Nicholas Lose, MD     Vitals:   02/21/23 1245 02/21/23 1319 02/21/23 1345 02/21/23 1500  BP:    (!) 150/45  Pulse: (!) 57  64 63  Resp: 15  15 18   Temp:  97.9 F (36.6 C)  97.7 F (36.5 C)  TempSrc:  Oral  Axillary  SpO2: 97%  99% 93%  Weight:      Height:       Exam Gen alert, no distress No rash, cyanosis or gangrene Sclera anicteric, throat clear  No jvd or bruits Chest light basilar rales bilat  RRR no MRG Abd soft ntnd no mass or ascites +bs GU defer MS no joint effusions or deformity Ext no LE or UE edema, no wounds or ulcers Neuro is alert, Ox 3 , nf, no asterixis   Home meds include - norvasc 10, coreg 25 bid, losartan 50 hs, percocet prn, lipitor, rocaltrol 0.25 mcg qod, sod bicaarb 1300 bid, tamoxifen 10mg  bid, prns/ vits/ supps     Date   Creat  eGFR    2013   0.69- 0.91    2016   2.12    2017   1.85- 2.19    Feb- may 2018 1.73- 2.05 31- 39 ml/min     Nov 2018  2.07- 2.64    Jun 2019  2.66    10/13/19  2.59  23 ml/min    April-jun 2023 3.61- 3.90 13- 14 ml/min    02/17/23  3.74  13 ml/min    3/22   4.10    02/21/23  4.76  10 ml/min      UA 3/23 - negative    CXR 3/23 - IMPRESSION: Cardiomegaly and vascular congestion/interstitial edema. Prominent enlargement of the main pulmonary artery suggesting pulmonary hypertension.     BP 133- 161/ 41- 82,  88% on RA --> 96% on ?    HR 50s- 60s, RR 14-18 , afeb      Na 141  K + 5.5  CO2 20  BUN 58 creat 3.74  Ca 9.0     LA 2.6 at 2 pm today     Hb 9-11 , WBC 5- 8k   plts 195-220    Assessment/ Plan: AHRF - presumably due to vol overload w/ IS edema on CXR. Has not responded well to IV lasix 80mg  so far today. Will ^lasix to 120mg  tid given her advanced CKD stage V. There is no anasarca, just pulm edema by CXR. She does have R AVF if needed but will try medical solutions first.   Hyperkalemia - renal diet  and 10 gm tid lokelma, repeat K+ 5.9. Hopefully will diurese which will help lower K+ as well.  AKI on CKD 5 - b/l creat 3.6- 3.9 from April-jun 2023. Creat here was 3.74 on admission and has bumped up to 4.76 today. BP's wnl, UA negative. Will get renal US. AKI could be d/t CHF exacerbation vs ATN vs other. Get urine lytes, diurese.  Met acidosis - not severe, hold sod bicarb tabs until volume improving DM2 - per pmd      Kelly Splinter  MD [facility] 02/21/2023, 3:32 PM  Recent Labs  Lab 02/17/23 1140 02/20/23 1125 02/21/23 0852 02/21/23 1417  HGB 9.8* 10.9* 9.7* 10.5*  CALCIUM 8.9  --  9.0  --   CREATININE 3.74* 4.10* 4.76*  --   K 5.5* 5.2* 6.5* 6.0*   Inpatient medications:  amoxicillin  500 mg Oral Q12H   atorvastatin  40 mg Oral QHS   calcitRIOL  0.25 mcg Oral QODAY   heparin  5,000 Units Subcutaneous Q8H   insulin aspart  0-9 Units Subcutaneous TID WC   sodium bicarbonate  1,300 mg Oral BID PC   tamoxifen  10 mg Oral Daily    acetaminophen **OR** acetaminophen, polyethylene glycol

## 2023-02-21 NOTE — Plan of Care (Signed)

## 2023-02-21 NOTE — ED Triage Notes (Signed)
Pt to ED from home via POV. Pt states having nasal septum surgery yesterday and was discharged same day. Family member states pt was having difficulty answering questions when pt got home and pt's O2 saturation was low. Pt currently A/Ox4 and answering questions appropriately. O2 saturations are currently low to mid 90s on RA.

## 2023-02-21 NOTE — ED Notes (Signed)
Pt off leads to use restroom, alarm silenced in response

## 2023-02-21 NOTE — Progress Notes (Signed)
Patient reporting itchy feet bilaterally since yesterday. Patient also reported chest was itchy yesterday but not today. She says it feels like raised itchy bumps but none noted on assessment.

## 2023-02-21 NOTE — ED Notes (Signed)
Patient transported to X-ray 

## 2023-02-21 NOTE — H&P (Signed)
Date: 02/21/2023               Patient Name:  Sarah Kline MRN: [id number]  DOB: 10-Apr-1966 Age / Sex: 57 y.o., female   PCP: Colletta Maryland, MD         Medical Service: Internal Medicine Teaching Service         Attending Physician: Dr. Sid Falcon, MD    First Contact: Dr. Iona Coach Pager: [phone]  Second Contact: Dr. Gaylan Gerold Pager: [phone]       After Hours (After 5p/  First Contact Pager: [phone]  weekends / holidays): Second Contact Pager: [phone]   Chief Complaint: [facility]  History of Present Illness:   Sarah Kline is a 57 year old female living with blindness, T2DM, HTN, HLD, hx of DCIS of L breast s/p lumpectomy (2023; currently on tamoxifen), CKD V,  and recent septoplasty and bilateral partial inferior turbinate resection on 02/20/2023, presenting with SHOB.  She states that she has generally been at her usual baseline. She has chronically been experiencing [facility] since last September due to nasal problems. She had severe nasal septal deviation, bilateral inferior turbinate hypertrophy, and chronic nasal obstruction for which she underwent septoplasty and bilateral partial inferior turbinate resection with Dr. Benjamine Mola yesterday. Since that time, she has been feeling SHOB mainly because she is unable to breathe freely through her nose and has been having to mouth breathe. Husband at bedside, states that she looked like she was having a lot of trouble breathing this morning after waking up and this is why she was brought into the ED. She states that she has otherwise been feeling okay. She is legally blind, but her and her husband are able to manage this well at home. She is independent in her ADLs and needs assistance with iADLs due to her lack of vision, but this is chronic. She ambulates without any assistive devices and does well with this, no recent falls or imbalance.  She does note some chronic dyspnea with exertion but feels this is more due to her  nasal history. She sleeps using a husband pillow and thus practically sits up while sleeping, but does not note any difficulties with laying flat. She has not noticed any recent swelling of her legs.  She has a history of advanced CKD and follows Dr. Joelyn Oms (nephrology) for this. She does make urine and typically urinates about 6-7 times in a 24-hour period. She had a R brachiocephalic fistula placed in April 2023 and this has matured well. Does note that after the septoplasty yesterday, she has only urinated once around 10PM last night. Her PO intake since yesterday has also been very limited, had some fresco soda, lemonade, and a bite of a honeybun but no proper meal intake. Prior to the procedure, she was eating and drinking fine.   After the nasal septoplasty, she was prescribed amoxicillin 875mg  BID which she has been adherent with. Per ENT, plan was to continue amoxicillin for 3 days total (including yesterday).   ED course: Vitals notable for hypoxia to mid-80s, which corrected to 100% with face-mask at 2L [place]. Vitals also notable for mild bradycardia in the 50s. CBC notable for Hgb 9.7. BMP notable for K 6.5, HCO3 17, glucose 245, Cr 4.76 (baseline ~3.7-3.9), GFR 10 (baseline ~13), and BUN 69. BNP 1,115. CXR notable for vascular congestion, cardiomegaly, and prominent enlargement of main pulmonary artery. Nephrology consulted by EDP. IMTS asked to admit.   Meds:  No  current facility-administered medications on file prior to encounter.   Current Outpatient Medications on File Prior to Encounter  Medication Sig Dispense Refill   amLODipine (NORVASC) 10 MG tablet TAKE 1 TABLET EVERY EVENING 90 tablet 3   amoxicillin (AMOXIL) 875 MG tablet Take 1 tablet (875 mg total) by mouth 2 (two) times daily for 3 days. 6 tablet 0   atorvastatin (LIPITOR) 40 MG tablet Take 40 mg by mouth at bedtime.     Blood Glucose Monitoring Suppl (PRODIGY AUTOCODE BLOOD GLUCOSE) w/Device KIT 1 kit by Does not apply  route as directed.     calcitRIOL (ROCALTROL) 0.25 MCG capsule TAKE 1 CAPSULE EVERY DAY (Patient taking differently: Take 0.25 mcg by mouth every other day.) 90 capsule 3   carvedilol (COREG) 25 MG tablet Take 25 mg by mouth 2 (two) times daily.     losartan (COZAAR) 50 MG tablet Take 50 mg by mouth at bedtime.     oxyCODONE-acetaminophen (PERCOCET) 5-325 MG tablet Take 1 tablet by mouth every 6 (six) hours as needed for up to 3 days for severe pain. 12 tablet 0   sodium bicarbonate 650 MG tablet Take 1,300 mg by mouth 2 (two) times daily.  6   tamoxifen (NOLVADEX) 10 MG tablet Take 1 tablet (10 mg total) by mouth daily. 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   Physical Exam: Blood pressure (!) 161/82, pulse (!) 57, temperature 97.7 F (36.5 C), temperature source Oral, resp. rate 15, height 5\' 5"  (1.651 m), weight 102.5 kg, last menstrual period 04/28/2018, SpO2 97 %. Physical Exam Constitutional:      Appearance: She is well-developed. She is obese. She is not ill-appearing.  HENT:     Head: Normocephalic and atraumatic.     Nose:     Comments: Midline septum with splint in place. No bleeding noted.  TTP of exterior nose.    Mouth/Throat:     Mouth: Mucous membranes are moist.     Pharynx: Oropharynx is clear.  Eyes:     Comments: Legally blind bilaterally  Cardiovascular:     Rate and Rhythm: Regular rhythm. Bradycardia present.     Pulses: Normal pulses.     Heart sounds: Normal heart sounds. No murmur heard.    No friction rub. No gallop.     Comments: No JVD noted. Pulmonary:     Effort: Pulmonary effort is normal. No respiratory distress.     Breath sounds: No wheezing.  Comments: Bibasilar crackles noted. Abdominal:     General: Bowel sounds are normal. There is no distension.     Palpations: Abdomen is soft.     Tenderness: There is no abdominal tenderness. There is no guarding or rebound.  Musculoskeletal:        General: Normal range of motion.     Comments: No peripheral edema noted. No dependent edema noted either.  Skin:    General: Skin is warm and dry.     Comments: R brachiocephalic fistula in place, viable with good thrill.  Neurological:     General: No focal deficit present.     Mental Status: She is alert and oriented to person, place, and time.     Motor: No weakness.  Psychiatric:        Mood and Affect: Mood normal.        Behavior: Behavior normal.      EKG: personally reviewed my interpretation is sinus bradycardia and low voltage.   DG Chest 2 View Result Date: 02/21/2023 CLINICAL DATA:  Shortness of breath EXAM: CHEST - 2 VIEW COMPARISON:  05/07/2015 FINDINGS: Cardiomegaly and main pulmonary artery enlargement. The vascular hila also appear thickened. Interstitial opacity diffusely. No effusion or Kerley lines. No evidence of pneumonia or air leak. Extensive artifact from EKG leads. IMPRESSION: Cardiomegaly and vascular congestion/interstitial edema. Prominent enlargement of the main pulmonary artery suggesting pulmonary hypertension. Electronically Signed   By: Jorje Guild M.D.   On: 02/21/2023 09:17     Assessment & Plan by  Problem: Principal Problem:   Hypervolemia associated with renal insufficiency Active Problems:   Diabetes mellitus with hyperglycemia (HCC)   HTN (hypertension)   Legally blind   Acute renal failure superimposed on stage 5 chronic kidney disease, not on chronic dialysis (HCC)   Anemia of chronic renal failure, stage 5 (HCC)   Hyperlipidemia associated with type 2 diabetes mellitus ([place])   Ductal carcinoma in situ (DCIS) of left breast   Metabolic acidosis   Hyperkalemia   Sinus bradycardia  Acute hypoxic respiratory failure  Recent nasal septoplasty Volume overload 2/2 renal insufficiency Patient presenting with [facility] after recent nasal septoplasty (02/20/2023) which she primarily attributes to inability to breathe through her nose after surgery. She is requiring minimal supplemental oxygen via face-mask with good response and not in any acute respiratory distress (unable to use [place] given septoplasty). She does have signs of volume overload including bibasilar crackles, vascular congestion on CXR, and BNP >1K. She does continue to make urine. She was given a dose of IV lasix 80mg  here to facilitate diuresis. Unclear dry weight, but per chart review weights have been variable between 98.4kg - 104kg. She is not on a diuretic at home. CXR does show cardiomegaly and given signs of volume overload, will obtain echocardiography to assess heart function. Will continue amoxicillin (renally dosed) to complete course as recommended by ENT. -s/p IV lasix 80mg  -supplemental O2 prn to maintain O2 sats >92%, wean as tolerated -f/u ECHO -monitor UOP, strict I/O's -amoxicillin 500mg  q12 for 4 more doses  Hyperkalemia Metabolic acidosis with mildly elevated AG Acute on CKD V Patient with known advanced CKD (stage V). Her baseline Cr ~3.7-3.9 and GFR ~13. On admission, Cr 4.76 and GFR 10. It appears that her kidney function was relatively stable and at baseline about 4 days ago. Kidney function was noted to  be slightly worse yesterday prior to septoplasty with Cr 4.1, which could be in the setting of volume overload. However,  I suspect that further worsening may have been precipitated by her amoxicillin which was not renally dosed (prescribed 875mg  BID). She does have hyperkalemia up to 6.5, likely from underlying kidney dysfunction. EKG without any acute changes aside from sinus bradycardia, which may be due to hyperkalemia. She received a dose of lokelma 10g in the ED. She does have a metabolic acidosis with a mildly elevated gap at 13. Her diabetes is fairly well controlled and I have low suspicion for DKA despite hyperglycemia. I suspect that this is more likely due to underlying renal dysfunction, but will check UA to assess for ketones and possible AIN, lactate to assess for hypoperfusion, and a VBG to assess acid-base chemistry. Of note, nephrology has been consulted given acute on CKD, will evaluate today. She has a R brachiocephalic fistula placed a year ago and this has matured well, can likely be used if needed but we are not at that stage yet. Hopeful that her kidney function will improve with diuresis and we can hold off on initiating HD. -greatly appreciate nephrology management, will follow up recommendations -trend kidney function -diuresis as above -s/p lokelma -repeat potassium level at 1300, can give additional lokelma if needed -changed amoxicillin to renally dosed, now 500mg  q12 for 4 more doses -f/u UA, lactate, VBG -bladder scan once -strict I/O's -avoid nephrotoxic medications as able -holding losartan -resumed home calcitriol and sodium bicarbonate  Asymptomatic sinus bradycardia She is on coreg 25mg  BID and amlodipine 10mg  daily at home. She is not symptomatic from this standpoint and thus no acute intervention needed at this time. I do suspect that her new bradycardia is likely due to acute hyperkalemia. Anticipate that this will improve with correction of potassium. No  concerning features on EKG to suggest AV block. She is HDS. Given no AV block, hypotension, or shock, low concern for BRASH syndrome.  -telemetry -holding coreg and amlodipine -correction of hyperkalemia as above  Anemia of chronic renal disease Patient with Hgb 9.7. It was 9.8 about 4 days ago so low concern for acute bleed and patient denies any recent overt bleeding episodes aside from mild loss from septoplasty. I suspect that this is more likely anemia of chronic renal disease given advanced CKD. Will check iron studies to ensure she does not require supplementation given renal dysfunction. -trend hgb, transfuse if <7  -f/u iron studies  T2DM with hyperglycemia A1c 7.3% on 02/17/2023. She is not on any medications at home for her diabetes. On arrival, CBG 245 and she does have a metabolic acidosis with a mildly elevated AG. Low suspicion for DKA as she is relatively well-appearing and does not look volume deplete. However, will check UA for ketones and VBG to assess acid-base chemistry. If findings concerning for DKA, will treat appropriately. -trend CBGs, goal 140-180 -sensitive SSI (renal) -f/u UA and VBG as noted above  HTN HLD She is on coreg 25mg  BID, amlodipine 10mg  daily, and losartan 50mg  daily to manage HTN. On lipitor 40mg  qhs for HLD management. Currently holding her coreg and norvasc given sinus bradycardia. Holding losartan given AKI. She is hypertensive up to the 150s-160s/60s-80s. Given inability to use home medications, may need to use prn IV hydralazine for severe HTN until kidney function and bradycardia improve. -holding coreg, norvasc, losartan -consider prn IV hydralazine if SBP >180s -continue home lipitor  DCIS L breast s/p lumpectomy (2023) She follows Dr. Lindi Adie with oncology for this. Last visit 07/2022. She underwent lumpectomy and adjuvant radiation therapy (finished 07/30/2022). She is  currently on antiestrogen therapy with tamoxifen which Dr. Lindi Adie recommended  to continue to for 5 years. -continue home tamoxifen 10mg  daily   Dispo: Admit patient to Inpatient with expected length of stay greater than 2 midnights.  Signed: Virl Axe, MD 02/21/2023, 12:53 PM  Pager: [phone] After 5pm on weekdays and 1pm on weekends: On Call pager: [phone]

## 2023-02-21 NOTE — ED Provider Notes (Signed)
[facility] Provider Note   CSN: [id number] Arrival date & time: 02/21/23  [id number]     History  Chief Complaint  Patient presents with   Shortness of Breath    Sarah Kline is a 57 y.o. female.  Patient is a 57 year old female who presents with shortness of breath after having a septoplasty yesterday.  She had a septoplasty and bilateral partial inferior turbinate resection by Dr. Leonarda Salon.  She is here with her daughter who is at bedside.  History is obtained from the patient and the patient's daughter.  Of note, the patient is blind.  The daughter says that she had some lower oxygen saturations after surgery yesterday and they were having her do the incentive spirometer.  They did give her some Benadryl for itching as well as some pain medicine and she was "knocked out" per the daughter.  She was pretty out of it last night and when she woke up this morning she was less alert than she normally is.  They had noted at home that she had lower oxygen saturations that were dipping into the 80s on their portable monitor.  The patient says that she is having shortness of breath and that she cannot breathe through her nose.  She said that she breathes okay through her mouth.  The daughter said that she is more alert and similar to her baseline mental status currently.  She does not have any underlying lung disease.  No history of asthma.  She does not use inhalers at home.  She denies any leg swelling.  No fevers.  No chest pain.       Home Medications Prior to Admission medications   Medication Sig Start Date End Date Taking? Authorizing Provider  amLODipine (NORVASC) 10 MG tablet TAKE 1 TABLET EVERY EVENING 08/26/21   Simmons-Robinson, Riki Sheer, MD  amoxicillin (AMOXIL) 875 MG tablet Take 1 tablet (875 mg total) by mouth 2 (two) times daily for 3 days. 02/20/23 02/23/23  Leta Baptist, MD  atorvastatin (LIPITOR) 40 MG tablet Take 40 mg by mouth at bedtime.     [provider]  Blood Glucose Monitoring Suppl (PRODIGY AUTOCODE BLOOD GLUCOSE) w/Device KIT 1 kit by Does not apply route as directed.    [provider]  calcitRIOL (ROCALTROL) 0.25 MCG capsule TAKE 1 CAPSULE EVERY DAY Patient taking differently: Take 0.25 mcg by mouth every other day. 08/26/21   Simmons-Robinson, Riki Sheer, MD  carvedilol (COREG) 25 MG tablet Take 25 mg by mouth 2 (two) times daily.    [provider]  Dulaglutide (TRULICITY) 1.5 0000000 SOPN Inject 1.5 mg into the skin once a week. Patient not taking: Reported on 02/11/2023 01/26/20   Zenia Resides, MD  losartan (COZAAR) 50 MG tablet Take 50 mg by mouth at bedtime. 04/08/21   [provider]  oxyCODONE-acetaminophen (PERCOCET) 5-325 MG tablet Take 1 tablet by mouth every 6 (six) hours as needed for up to 3 days for severe pain. 02/20/23 02/23/23  Leta Baptist, MD  sodium bicarbonate 650 MG tablet Take 1,300 mg by mouth 2 (two) times daily. 06/02/18   [provider]  tamoxifen (NOLVADEX) 10 MG tablet Take 1 tablet (10 mg total) by mouth daily. 08/01/22   Nicholas Lose, MD      Allergies    Patient has no known allergies.    Review of Systems   Review of Systems  Constitutional:  Negative for chills, diaphoresis, fatigue and fever.  HENT:  Negative for congestion and sneezing.        Nasal swelling  Eyes: Negative.   Respiratory:  Positive for shortness of breath. Negative for cough and chest tightness.   Cardiovascular:  Negative for chest pain and leg swelling.  Gastrointestinal:  Negative for abdominal pain, blood in stool, diarrhea, nausea and vomiting.  Genitourinary:  Negative for difficulty urinating, flank pain, frequency and hematuria.  Musculoskeletal:  Negative for arthralgias and back pain.  Skin:  Negative for rash.  Neurological:  Negative for dizziness, speech difficulty, weakness, numbness and headaches.    Physical Exam Updated Vital Signs BP (!) 133/41   Pulse  (!) 56   Temp 97.7 F (36.5 C) (Oral)   Resp 14   Ht 5\' 5"  (1.651 m)   Wt 102.5 kg   LMP 04/28/2018   SpO2 (!) 87%   BMI 37.61 kg/m  Physical Exam Constitutional:      Appearance: She is well-developed.  HENT:     Head: Normocephalic and atraumatic.     Nose:     Comments: There is swelling to both nares.  Small amount of blood in the nares, no active bleeding.  No bleeding in the posterior pharnyx. Eyes:     Pupils: Pupils are equal, round, and reactive to light.  Cardiovascular:     Rate and Rhythm: Normal rate and regular rhythm.     Heart sounds: Normal heart sounds.  Pulmonary:     Effort: Pulmonary effort is normal. No respiratory distress.     Breath sounds: Normal breath sounds. No wheezing or rales.     Comments: Patient has some mild tachypnea.  Small amount of expiratory wheezing and crackles in the bases Chest:     Chest wall: No tenderness.  Abdominal:     General: Bowel sounds are normal.     Palpations: Abdomen is soft.     Tenderness: There is no abdominal tenderness. There is no guarding or rebound.  Musculoskeletal:        General: Normal range of motion.     Cervical back: Normal range of motion and neck supple.  Lymphadenopathy:     Cervical: No cervical adenopathy.  Skin:    General: Skin is warm and dry.     Findings: No rash.  Neurological:     Mental Status: She is alert and oriented to person, place, and time.     ED Results / Procedures / Treatments   Labs (all labs ordered are listed, but only abnormal results are displayed) Labs Reviewed  CBC WITH DIFFERENTIAL/PLATELET - Abnormal; Notable for the following components:      Result Value   RBC 3.23 (*)    Hemoglobin 9.7 (*)    HCT 31.2 (*)    Lymphs Abs 0.3 (*)    All other components within normal limits  BASIC METABOLIC PANEL - Abnormal; Notable for the following components:   Potassium 6.5 (*)    CO2 17 (*)    Glucose, Bld 245 (*)    BUN 69 (*)    Creatinine, Ser 4.76 (*)     GFR, Estimated 10 (*)    All other components within normal limits  BRAIN NATRIURETIC PEPTIDE - Abnormal; Notable for the following components:   B Natriuretic Peptide 1,115.9 (*)    All other components within normal limits    EKG EKG Interpretation  Date/Time:  Saturday February 21 2023 08:05:51 EDT Ventricular Rate:  55 PR Interval:  180 QRS Duration:  86 QT Interval:  456 QTC Calculation: 437 R Axis:   63 Text Interpretation: Sinus rhythm Probable left atrial enlargement Low voltage, precordial leads since last tracing no significant change Confirmed by Malvin Johns [phone]) on 02/21/2023 8:41:36 AM  Radiology DG Chest 2 View  Result Date: 02/21/2023 CLINICAL DATA:  Shortness of breath EXAM: CHEST - 2 VIEW COMPARISON:  05/07/2015 FINDINGS: Cardiomegaly and main pulmonary artery enlargement. The vascular hila also appear thickened. Interstitial opacity diffusely. No effusion or Kerley lines. No evidence of pneumonia or air leak. Extensive artifact from EKG leads. IMPRESSION: Cardiomegaly and vascular congestion/interstitial edema. Prominent enlargement of the main pulmonary artery suggesting pulmonary hypertension. Electronically Signed   By: Jorje Guild M.D.   On: 02/21/2023 09:17    Procedures Procedures    Medications Ordered in ED Medications  albuterol (VENTOLIN HFA) 108 (90 Base) MCG/ACT inhaler 2 puff (2 puffs Inhalation Given 02/21/23 0838)  sodium zirconium cyclosilicate (LOKELMA) packet 10 g (10 g Oral Given 02/21/23 1019)  furosemide (LASIX) injection 80 mg (80 mg Intravenous Given 02/21/23 1019)    ED Course/ Medical Decision Making/ A&P                             Medical Decision Making Amount and/or Complexity of Data Reviewed Labs: ordered. Radiology: ordered.  Risk Prescription drug management. Decision regarding hospitalization.   Patient is a 57 year old female who presents with shortness of breath after having nasal surgery yesterday.  Initially I  thought it was more from the nasal swelling although she has a little tachypnea and does have some hypoxia with oxygen saturations in the upper 80s on room air.  It did fluctuate between upper 80s and low 90s so initially she was not placed on oxygen but then it was staying more persistently in the mid and upper 80s so she was placed on a Venturi mask given her nasal swelling.  Her chest x-ray was interpreted by me and confirmed by the radiologist to show some pulmonary vascular congestion.  Her labs show some worsening acute on chronic renal failure as compared to her prior values.  Her potassium is elevated at 6.5.  She had been given some albuterol and was also given Lokelma to treat her hyperkalemia.  She was also given some IV Lasix.  I spoke with Dr. Jonnie Finner with nephrology who will see the patient.  I did speak with the internal medicine resident who is on for unassigned medicine who will admit the patient for further treatment.  CRITICAL CARE Performed by: Malvin Johns Total critical care time: 70 minutes Critical care time was exclusive of separately billable procedures and treating other patients. Critical care was necessary to treat or prevent imminent or life-threatening deterioration. Critical care was time spent personally by me on the following activities: development of treatment plan with patient and/or surrogate as well as nursing, discussions with consultants, evaluation of patient's response to treatment, examination of patient, obtaining history from patient or surrogate, ordering and performing treatments and interventions, ordering and review of laboratory studies, ordering and review of radiographic studies, pulse oximetry and re-evaluation of patient's condition.   Final Clinical Impression(s) / ED Diagnoses Final diagnoses:  Acute renal failure, unspecified acute renal failure type ([place])  Hyperkalemia  Acute pulmonary edema ([place])  Hypoxia    Rx / DC Orders ED Discharge  Orders     None         Malvin Johns, MD 02/21/23  1048  

## 2023-02-22 ENCOUNTER — Inpatient Hospital Stay (HOSPITAL_COMMUNITY): Payer: Medicare HMO

## 2023-02-22 DIAGNOSIS — I272 Pulmonary hypertension, unspecified: Secondary | ICD-10-CM | POA: Diagnosis not present

## 2023-02-22 DIAGNOSIS — J811 Chronic pulmonary edema: Secondary | ICD-10-CM

## 2023-02-22 DIAGNOSIS — J9601 Acute respiratory failure with hypoxia: Secondary | ICD-10-CM

## 2023-02-22 DIAGNOSIS — I517 Cardiomegaly: Secondary | ICD-10-CM

## 2023-02-22 DIAGNOSIS — N289 Disorder of kidney and ureter, unspecified: Secondary | ICD-10-CM | POA: Diagnosis not present

## 2023-02-22 LAB — GLUCOSE, CAPILLARY
Glucose-Capillary: 137 mg/dL — ABNORMAL HIGH (ref 70–99)
Glucose-Capillary: 134 mg/dL — ABNORMAL HIGH (ref 70–99)
Glucose-Capillary: 132 mg/dL — ABNORMAL HIGH (ref 70–99)
Glucose-Capillary: 140 mg/dL — ABNORMAL HIGH (ref 70–99)

## 2023-02-22 LAB — CBC
HCT: 28.2 % — ABNORMAL LOW (ref 36.0–46.0)
Hemoglobin: 9.4 g/dL — ABNORMAL LOW (ref 12.0–15.0)
MCH: 29.6 pg (ref 26.0–34.0)
MCHC: 33.3 g/dL (ref 30.0–36.0)
MCV: 88.7 fL (ref 80.0–100.0)
Platelets: 207 10*3/uL (ref 150–400)
RBC: 3.18 MIL/uL — ABNORMAL LOW (ref 3.87–5.11)
RDW: 14.2 % (ref 11.5–15.5)
WBC: 8 10*3/uL (ref 4.0–10.5)
nRBC: 0 % (ref 0.0–0.2)

## 2023-02-22 LAB — ECHOCARDIOGRAM COMPLETE
AR max vel: 1.77 cm2
AV Area VTI: 1.81 cm2
AV Area mean vel: 1.72 cm2
AV Mean grad: 10.8 mmHg
AV Peak grad: 18.9 mmHg
Ao pk vel: 2.18 m/s
Area-P 1/2: 2.09 cm2
Height: 65 in
S' Lateral: 2.5 cm
Weight: 3661.4 oz

## 2023-02-22 LAB — RENAL FUNCTION PANEL
Albumin: 3.6 g/dL (ref 3.5–5.0)
Anion gap: 13 (ref 5–15)
BUN: 80 mg/dL — ABNORMAL HIGH (ref 6–20)
CO2: 17 mmol/L — ABNORMAL LOW (ref 22–32)
Calcium: 8.6 mg/dL — ABNORMAL LOW (ref 8.9–10.3)
Chloride: 104 mmol/L (ref 98–111)
Creatinine, Ser: 5.33 mg/dL — ABNORMAL HIGH (ref 0.44–1.00)
GFR, Estimated: 9 mL/min — ABNORMAL LOW (ref >60)
Glucose, Bld: 123 mg/dL — ABNORMAL HIGH (ref 70–99)
Phosphorus: 6.4 mg/dL — ABNORMAL HIGH (ref 2.5–4.6)
Potassium: 5.7 mmol/L — ABNORMAL HIGH (ref 3.5–5.1)
Sodium: 134 mmol/L — ABNORMAL LOW (ref 135–145)

## 2023-02-22 LAB — CREATININE, URINE, RANDOM: Creatinine, Urine: 42 mg/dL

## 2023-02-22 LAB — SODIUM, URINE, RANDOM: Sodium, Ur: 97 mmol/L

## 2023-02-22 MED ORDER — FUROSEMIDE 10 MG/ML IJ SOLN
160.0000 mg | Freq: Three times a day (TID) | INTRAVENOUS | Status: DC
  Administered 2023-02-22 – 2023-02-24 (×6): 160 mg via INTRAVENOUS
  Filled 2023-02-22: qty 10
  Filled 2023-02-22 (×2): qty 16
  Filled 2023-02-22 (×3): qty 10
  Filled 2023-02-22 (×2): qty 16

## 2023-02-22 MED ORDER — SODIUM BICARBONATE 650 MG PO TABS
650.0000 mg | ORAL_TABLET | Freq: Two times a day (BID) | ORAL | Status: DC
  Administered 2023-02-22 – 2023-02-27 (×11): 650 mg via ORAL
  Filled 2023-02-22 (×11): qty 1

## 2023-02-22 MED ORDER — CAMPHOR-MENTHOL 0.5-0.5 % EX LOTN: TOPICAL_LOTION | CUTANEOUS | Status: DC | PRN

## 2023-02-22 NOTE — Evaluation (Addendum)
Physical Therapy Evaluation and Discharge Patient Details Name: Sarah Kline MRN: [id number] DOB: [birth year] Today's Date: 02/22/2023  History of Present Illness  57 year old female s/p 02/20/23 septoplasty and bilateral partial inferior turbinate resection. Presents to ED with SoB. Found to be hypoxic in mid 80s on RA, and bradycardic. BMP notable for K 6.5, HCO3 17, glucose 245, Cr 4.76 (baseline ~3.7-3.9), GFR 10 (baseline ~13), and BUN 69. BNP 1,115 CXR notable for vascular congestion, cardiomegaly, and prominent enlargement of main pulmonary artery. 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 Pt independent with mobility and ADLs in her home environment, requires set up for some iADLs and HHA for mobility in novel environments due to visual deficits. Pt is currently limited in safe mobility by need for supplemental O2 and tank management with mobility. Pt is mod I for bed mobility, transfers and ambulation in hallway. Pt family can provide assist as needed at discharge. Pt has no PT needs at this time and will sign off. Referral made to Mobility specialist to manage O2 tank while family walks with pt in hallway.        Recommendations for follow up therapy are one component of a multi-disciplinary discharge planning process, led by the attending physician.  Recommendations may be updated based on patient status, additional functional criteria and insurance authorization.  Follow Up Recommendations No PT follow up      Assistance Recommended at Discharge PRN  Patient can return home with the following  Direct supervision/assist for medications management    Equipment Recommendations None recommended by  PT     Functional Status Assessment Patient has not had a recent decline in their functional status     Precautions / Restrictions Precautions Precautions: Fall Precaution Comments: blind, requires HHA for navigation of novel environments Restrictions Weight Bearing Restrictions: No      Mobility  Bed Mobility Overal bed mobility: Modified Independent             General bed mobility comments: HoB elevated, hand over hand to find bed rail    Transfers Overall transfer level: Modified independent                 General transfer comment: HHA for power up    Ambulation/Gait Ambulation/Gait assistance: Modified independent (Device/Increase time) Gait Distance (Feet): 60 Feet Assistive device: 1 person hand held assist Gait Pattern/deviations: Step-through pattern, Decreased step length - right, Decreased step length - left, Shuffle Gait velocity: slowed Gait velocity interpretation: <1.8 ft/sec, indicate of risk for recurrent falls   General Gait Details: HHA and increased cuing for ambulation out of room and into hallway.      Balance Overall balance assessment: Mild deficits observed, not formally tested                                           Pertinent Vitals/Pain Pain Assessment Pain Assessment: Faces Faces Pain Scale: Hurts little more Pain Location: nose Pain Descriptors / Indicators: Sore, Tender Pain Intervention(s): Limited activity within patient's tolerance, Monitored during session, Repositioned    Home Living Family/patient expects to be discharged to::  Private residence Living Arrangements: Spouse/significant other Available Help at Discharge: Family Type of Home: House Home Access: Stairs to enter   [employer] of Steps: Flasher: One level;Laundry or work area in [employer]: None      Prior Function Prior Level of Function : Independent/Modified Independent              Mobility Comments: independent with household ambulation, requires HHA for community level ambulation due to visual deficits ADLs Comments: independent with ADLs and iADLs in home environment, need set up in novel environments     Hand Dominance   Dominant Hand: Right    Extremity/Trunk Assessment   Upper Extremity Assessment Upper Extremity Assessment: Overall WFL for tasks assessed    Lower Extremity Assessment Lower Extremity Assessment: Overall WFL for tasks assessed       Communication   Communication: No difficulties  Cognition Arousal/Alertness: Awake/alert Behavior During Therapy: WFL for tasks assessed/performed Overall Cognitive Status: Impaired/Different from baseline                                          General Comments General comments (skin integrity, edema, etc.): SpO2 on 3L O2 via regular mask >92%O2, VSS        Assessment/Plan    PT Assessment Patient does not need any further PT services         PT Goals (Current goals can be found in the Care Plan section)  Acute Rehab PT Goals Patient Stated Goal: have less nose pain PT Goal Formulation: All assessment and education complete, DC therapy     AM-PAC PT "6 Clicks" Mobility  Outcome Measure Help needed turning from your back to your side while in a flat bed without using bedrails?: A Little Help needed moving from lying on your back to sitting on the side of a flat bed without using bedrails?: A Little Help needed moving to and from a bed to a chair (including a wheelchair)?: A Little Help needed standing up from a chair using your arms (e.g., wheelchair or bedside chair)?: A Little Help needed to walk in hospital room?: A Little Help needed climbing 3-5 steps with a railing? : A Little 6 Click Score: 18    End of Session Equipment Utilized During Treatment: Gait belt;Oxygen Activity Tolerance: Patient tolerated treatment well Patient left: in bed;with call bell/phone  within reach;with family/visitor present Nurse Communication: Mobility status PT Visit Diagnosis: Difficulty in walking, not elsewhere classified (R26.2)    Time: [id number] PT Time Calculation (min) (ACUTE ONLY): 24 min   Charges:   PT Evaluation $PT Eval Moderate Complexity: 1 Mod PT Treatments $Therapeutic Activity: 8-22 mins        Carianna Lague B. [name] PT, DPT Acute Rehabilitation Services Please use secure chat or  Call Office [phone]   [place] 02/22/2023, 10:49 AM

## 2023-02-22 NOTE — Progress Notes (Signed)
PT Cancellation Note  Patient Details Name: Sarah Kline MRN: [id number] DOB: 09-25-1966   Cancelled Treatment:    Reason Eval/Treat Not Completed: (P) Patient at procedure or test/unavailable ECHO in room. PT will follow back for Evaluation later today as able.  Steven Basso B. [name] PT, DPT Acute Rehabilitation Services Please use secure chat or  Call Office [phone]    [place] 02/22/2023, 8:25 AM

## 2023-02-22 NOTE — Progress Notes (Addendum)
[facility] Progress Note  Subjective: seen in room. UOP 200 cc yest and 600 cc recorded for today so far. BP's stable, HR stable. Creat 5.3 today.   Vitals:   02/21/23 2334 02/22/23 0500 02/22/23 0551 02/22/23 0715  BP: 129/70   (!) 145/60  Pulse: 61   64  Resp: 15   15  Temp: 98.4 F (36.9 C)  98.3 F (36.8 C) 98.8 F (37.1 C)  TempSrc: Oral  Oral Oral  SpO2: 94%   97%  Weight:  103.8 kg    Height:        Exam: Gen alert, no distress No rash, cyanosis or gangrene Sclera anicteric, throat clear  No jvd or bruits Chest clear to bases bilat today RRR no MRG Abd soft ntnd no mass or ascites +bs Ext no LE edema Neuro is alert, Ox 3 , nf, no asterixis    Home meds include - norvasc 10, coreg 25 bid, losartan 50 hs, percocet prn, lipitor, rocaltrol 0.25 mcg qod, sod bicaarb 1300 bid, tamoxifen 10mg  bid, prns/ vits/ supps      Date                          Creat               eGFR    2013                         0.69- 0.91    2016                         2.12    2017                         1.85- 2.19    Feb- may 2018         1.73- 2.05        31- 39 ml/min      Nov 2018                  2.07- 2.64    Jun 2019                  2.66    10/13/19                   2.59                 23 ml/min    April-jun 2023           3.61- 3.90        13- 14 ml/min    02/17/23                     3.74                 13 ml/min    3/22                          4.10    02/21/23                     4.76                 10 ml/min         UA 3/23 - negative   UNa 97, UCr 42  CXR 3/23 - IMPRESSION: Cardiomegaly and vascular congestion/interstitial edema. Prominent enlargement of the main pulmonary artery suggesting pulmonary hypertension.     Hb 9-11 , WBC 5- 8k   plts 195-220      Renal US - 8.1/ 6.7 cm kidneys w/o hydro     Assessment/ Plan: AKI on CKD 5 - b/l creat 3.6- 3.9 from April-jun 2023. F/b Dr. Joelyn Oms at [facility]. Creat here was 3.74 on admit 3/19 and was ^'d at  4.76 at time of consult. BP's wnl, UA negative, renal US shows small kidneys c/w advanced CKD. Creat ^'d to 5.3 today. AKI d/t CHF vs diuretics vs ATN vs other. Plan as below.  AHRF - w/ SOB as presenting complaint. Presumably due to vol overload w/ IS edema on initial CXR. Did not respond well to IV lasix 80mg  x 1 in ED and on Lasix IV 120mg  tid and zaroxolyn 5 qd, UOP still has not been great. Pt has advanced CKD stage V, will ^to 160mg  tid IV lasix. Get f/u CXR and try to wean off O2 if will tolerate. She does have R AVF so if needed we can use this for dialysis soon but no uremic c/o's yet so will hold off for now.  Hyperkalemia - repeat K+ is high 5's, not really improving. Cont lokelma 10gm tid for now w/ renal diet.  Met acidosis - will resume sod bicarb tabs at 1 bid for now DM2 - per pmd       Kelly Splinter MD [facility] 02/22/2023, 10:58 AM  Recent Labs  Lab 02/21/23 0852 02/21/23 1402 02/21/23 1417 02/22/23 0724 02/22/23 0946  HGB 9.7*  --  10.5*  --  9.4*  ALBUMIN  --   --   --  3.6  --   CALCIUM 9.0  --   --  8.6*  --   PHOS  --  7.4*  --  6.4*  --   CREATININE 4.76*  --   --  5.33*  --   K 6.5* 5.9* 6.0* 5.7*  --    Recent Labs  Lab 02/21/23 1402  IRON 50  TIBC 353  FERRITIN 79   Inpatient medications:  amoxicillin  500 mg Oral Q12H   atorvastatin  40 mg Oral QHS   calcitRIOL  0.25 mcg Oral QODAY   heparin  5,000 Units Subcutaneous Q8H   insulin aspart  0-9 Units Subcutaneous TID WC   metolazone  5 mg Oral Daily   sodium zirconium cyclosilicate  10 g Oral TID   tamoxifen  10 mg Oral Daily    furosemide     acetaminophen **OR** acetaminophen, polyethylene glycol

## 2023-02-22 NOTE — Progress Notes (Addendum)
Notified by RN for intermittent epistaxis.  When seen at bedside, patient has bloody mucus dribbling but no profuse bleeding.  There is dried blood noted around the nasal nares. She stated that she has been having intermittent nasal bleeding since her surgery.  She did not notify her ENT.  Fortunately her bleeding is not profuse and her hemoglobin is stable since admission.  Since she is due for her splint removal scheduled for tomorrow, I will reach out to Dr. Benjamine Mola, who performed her surgery for recommendations.  Will hold subcu heparin for DVT today.   Addendum Dr. Benjamine Mola recommended frequent gauze change.  He will see the patient tomorrow morning.

## 2023-02-22 NOTE — Progress Notes (Signed)
MD notified of bleeding nares, verbal orders received to hold Heparin.

## 2023-02-22 NOTE — Plan of Care (Signed)

## 2023-02-22 NOTE — Progress Notes (Signed)
HD#1 Subjective:  Overnight Events: no event  Patient seen at bedside with her husband.  She reports mild shortness of breath from her nasal issue.  She reports minimal amount of urine output overnight.  She only goes 2 times since last night.  Objective:  Vital signs in last 24 hours: Vitals:   02/21/23 1917 02/21/23 2334 02/22/23 0500 02/22/23 0551  BP: 108/80 129/70    Pulse: 64 61    Resp: 20 15    Temp: 98.4 F (36.9 C) 98.4 F (36.9 C)  98.3 F (36.8 C)  TempSrc: Oral Oral  Oral  SpO2: 95% 94%    Weight:   103.8 kg   Height:       Supplemental O2: cpap SpO2: 94 % O2 Flow Rate (L/min): 3 L/min   Physical Exam:  Physical Exam Constitutional:      General: She is not in acute distress.    Appearance: She is not ill-appearing.  HENT:     Head: Normocephalic.  Eyes:     General:        Right eye: No discharge.        Left eye: No discharge.  Cardiovascular:     Rate and Rhythm: Normal rate.     Heart sounds: Murmur (3/6 systolic murmur.  Could be from her right upper AV fistula) heard.     Comments: Trace edema bilateral lower extremity Pulmonary:     Effort: Pulmonary effort is normal.     Comments: Mild crackles heard at bilateral lower lung bases. Abdominal:     General: Bowel sounds are normal.     Palpations: Abdomen is soft.  Musculoskeletal:     Comments: Palpable thrill of right AV fistula  Skin:    General: Skin is warm.  Neurological:     Mental Status: She is alert. Mental status is at baseline.  Psychiatric:        Mood and Affect: Mood normal.     Filed Weights   02/21/23 0820 02/22/23 0500  Weight: 102.5 kg 103.8 kg     Intake/Output Summary (Last 24 hours) at 02/22/2023 0621 Last data filed at 02/22/2023 0541 Gross per 24 hour  Intake 240 ml  Output 600 ml  Net -360 ml   Net IO Since Admission: -360 mL [02/22/23 0621]  Pertinent Labs:    Latest Ref Rng & Units 02/21/2023    2:17 PM 02/21/2023    8:52 AM 02/20/2023   11:25  AM  CBC  WBC 4.0 - 10.5 K/uL  8.0    Hemoglobin 12.0 - 15.0 g/dL 10.5  9.7  10.9   Hematocrit 36.0 - 46.0 % 31.0  31.2  32.0   Platelets 150 - 400 K/uL  195         Latest Ref Rng & Units 02/21/2023    2:17 PM 02/21/2023    2:02 PM 02/21/2023    8:52 AM  CMP  Glucose 70 - 99 mg/dL   245   BUN 6 - 20 mg/dL   69   Creatinine 0.44 - 1.00 mg/dL   4.76   Sodium 135 - 145 mmol/L 134   135   Potassium 3.5 - 5.1 mmol/L 6.0  5.9  6.5   Chloride 98 - 111 mmol/L   105   CO2 22 - 32 mmol/L   17   Calcium 8.9 - 10.3 mg/dL   9.0     Imaging: US RENAL  Result Date: 02/21/2023 CLINICAL DATA:  Acute on chronic renal failure EXAM: RENAL / URINARY TRACT ULTRASOUND COMPLETE COMPARISON:  01/29/2018 FINDINGS: Right Kidney: Renal measurements: 8.1 x 4.0 x 3.4 cm. = volume: 57 mL. No hydronephrosis is noted. Cortical thinning is seen as well as increased echogenicity consistent with the given clinical history. Left Kidney: Renal measurements: 6.7 x 4.0 x 3.4 cm. = volume: 47 mL. Increased echogenicity is noted. No hydronephrosis is seen. Bladder: Decompressed Other: None. IMPRESSION: Small kidneys with increased echogenicity consistent with medical renal disease. No obstructive changes are noted. Electronically Signed   By: Inez Catalina M.D.   On: 02/21/2023 20:45   DG Chest 2 View  Result Date: 02/21/2023 CLINICAL DATA:  Shortness of breath EXAM: CHEST - 2 VIEW COMPARISON:  05/07/2015 FINDINGS: Cardiomegaly and main pulmonary artery enlargement. The vascular hila also appear thickened. Interstitial opacity diffusely. No effusion or Kerley lines. No evidence of pneumonia or air leak. Extensive artifact from EKG leads. IMPRESSION: Cardiomegaly and vascular congestion/interstitial edema. Prominent enlargement of the main pulmonary artery suggesting pulmonary hypertension. Electronically Signed   By: Jorje Guild M.D.   On: 02/21/2023 09:17    Assessment/Plan:   Principal Problem:   Hypervolemia associated  with renal insufficiency Active Problems:   Diabetes mellitus with hyperglycemia (HCC)   HTN (hypertension)   Legally blind   Acute renal failure superimposed on stage 5 chronic kidney disease, not on chronic dialysis (HCC)   Anemia of chronic renal failure, stage 5 (HCC)   Hyperlipidemia associated with type 2 diabetes mellitus (HCC)   Ductal carcinoma in situ (DCIS) of left breast   Metabolic acidosis   Hyperkalemia   Sinus bradycardia   Acute renal failure superimposed on stage 5 chronic kidney disease, not on chronic dialysis, unspecified acute renal failure type [facility])   Patient Summary: Sarah Kline is a 57 y.o. living with visual blindness, T2DM, HTN, HLD, hx of DCIS of L breast s/p lumpectomy (2023; currently on tamoxifen), CKD V, and recent septoplasty and bilateral partial inferior turbinate resection on 02/20/2023, presenting with [facility], admitted for worsening renal function and progression towards ESRD.  Acute hypoxic respiratory failure 2/2 pulmonary edema Recent nasal septoplasty CKD 5 => ESRD Unclear etiology of her acute worsening kidney function.  Could be from her amoxicillin causing AIN.  She was placed on IV Lasix and only put out minimal urine last night.  Her kidney function also worsened today.  I am worried that she is heading towards ESRD and will need dialysis soon.  She has a functional right upper extremity AV fistula. -Appreciate nephrology recommendation -Follow-up on echocardiogram to rule out heart failure -Continue IV Lasix and Metolazone -Avoid nephrotoxic medications -amoxicillin 500mg  q12 for 4 more doses   Hyperkalemia Secondary to ESRD.  Potassium is trending down slowly. -Continue Lokelma 10 mg 3 times daily  Metabolic acidosis with mildly elevated AG Secondary to ESRD.  Bicarb was held until volume is improving   Asymptomatic sinus bradycardia - improving Likely due to acute hyperkalemia.  Heart rate improved to the 60s today and patient  is without any symptoms. -telemetry -holding coreg  -correction of hyperkalemia as above   Anemia of chronic renal disease Her hemoglobin stable with baseline around 9-10.  Ferritin is inappropriately normal at 79, which suggests a component of iron deficiency anemia.  Will hold off on IV iron in the setting of volume overload.   T2DM with hyperglycemia A1c 7.3% on 02/17/2023.  -sensitive SSI (renal)   HTN HLD -Holding Coreg, Norvasc and  losartan.  Her blood pressure normalized this morning due to IV diuresis. -continue home lipitor   DCIS L breast s/p lumpectomy (2023) She follows Dr. Lindi Adie with oncology for this. Last visit 07/2022. She underwent lumpectomy and adjuvant radiation therapy (finished 07/30/2022). She is currently on antiestrogen therapy with tamoxifen which Dr. Lindi Adie recommended to continue to for 5 years. -continue home tamoxifen 10mg  daily  Diet: Renal IVF: None,None VTE: Heparin Code: Full PT/OT recs: None, none. TOC recs:   Dispo: Anticipated discharge to Home in 2 days pending improvement of kidney function.   Gaylan Gerold, DO 02/22/2023, 6:21 AM Pager: [phone]  Please contact the on call pager after 5 pm and on weekends at [phone].

## 2023-02-23 DIAGNOSIS — N179 Acute kidney failure, unspecified: Secondary | ICD-10-CM

## 2023-02-23 DIAGNOSIS — N289 Disorder of kidney and ureter, unspecified: Secondary | ICD-10-CM | POA: Diagnosis not present

## 2023-02-23 DIAGNOSIS — J810 Acute pulmonary edema: Secondary | ICD-10-CM

## 2023-02-23 DIAGNOSIS — J9601 Acute respiratory failure with hypoxia: Secondary | ICD-10-CM | POA: Diagnosis not present

## 2023-02-23 DIAGNOSIS — N185 Chronic kidney disease, stage 5: Secondary | ICD-10-CM

## 2023-02-23 DIAGNOSIS — R0902 Hypoxemia: Secondary | ICD-10-CM

## 2023-02-23 LAB — CBC
HCT: 28.7 % — ABNORMAL LOW (ref 36.0–46.0)
Hemoglobin: 9.7 g/dL — ABNORMAL LOW (ref 12.0–15.0)
MCH: 30.2 pg (ref 26.0–34.0)
MCHC: 33.8 g/dL (ref 30.0–36.0)
MCV: 89.4 fL (ref 80.0–100.0)
Platelets: 205 10*3/uL (ref 150–400)
RBC: 3.21 MIL/uL — ABNORMAL LOW (ref 3.87–5.11)
RDW: 14.5 % (ref 11.5–15.5)
WBC: 6 10*3/uL (ref 4.0–10.5)
nRBC: 0 % (ref 0.0–0.2)

## 2023-02-23 LAB — GLUCOSE, CAPILLARY
Glucose-Capillary: 145 mg/dL — ABNORMAL HIGH (ref 70–99)
Glucose-Capillary: 125 mg/dL — ABNORMAL HIGH (ref 70–99)
Glucose-Capillary: 147 mg/dL — ABNORMAL HIGH (ref 70–99)
Glucose-Capillary: 208 mg/dL — ABNORMAL HIGH (ref 70–99)

## 2023-02-23 LAB — BASIC METABOLIC PANEL
Anion gap: 11 (ref 5–15)
BUN: 88 mg/dL — ABNORMAL HIGH (ref 6–20)
CO2: 18 mmol/L — ABNORMAL LOW (ref 22–32)
Calcium: 8.4 mg/dL — ABNORMAL LOW (ref 8.9–10.3)
Chloride: 104 mmol/L (ref 98–111)
Creatinine, Ser: 5.21 mg/dL — ABNORMAL HIGH (ref 0.44–1.00)
GFR, Estimated: 9 mL/min — ABNORMAL LOW (ref >60)
Glucose, Bld: 148 mg/dL — ABNORMAL HIGH (ref 70–99)
Potassium: 4.9 mmol/L (ref 3.5–5.1)
Sodium: 133 mmol/L — ABNORMAL LOW (ref 135–145)

## 2023-02-23 MED ORDER — POLYETHYLENE GLYCOL 3350 17 G PO PACK
17.0000 g | PACK | Freq: Every day | ORAL | Status: DC
  Administered 2023-02-23 – 2023-02-27 (×4): 17 g via ORAL
  Filled 2023-02-23 (×5): qty 1

## 2023-02-23 NOTE — Progress Notes (Signed)
The patient has been experiencing intermittent nasal bleeding over the weekend, which is typical for a patient status post septoplasty and turbinate reduction.  The patient's nasal splints are removed today without difficulty.Her septum and turbinates are healing well.  Continue to use nasal drip pad as needed.  No other ENT intervention is needed at this time.  The patient may follow-up in my office in 2 weeks.

## 2023-02-23 NOTE — Progress Notes (Signed)
HD#2 Subjective:  Overnight Events: no event  Patient seen at bedside with her daughter.Reports on Friday after nasal septoplast gave her benadryl after she had anesthesia because she was having a reactions. She had low oxygen but was discharged to home. Did have worsening oxygenation overnight as well as confusion.She does have low oxygen when she sleeping. Saturday morning persistent confusion and low oxygen, cam back to hospital. Allegedly was not ever supposed to be discharged.Patient is alert and oriented x4. Patient feels her thinking is more clear. She denies any shortness of breath or chest pain. Acknowledges low oxygen, thinks it may be due to her nasal packing and epistaxis. She denies any cough. She reports she is still having epistaxis. She does report some constipation.   Objective:  Vital signs in last 24 hours: Vitals:   02/22/23 2300 02/23/23 0505 02/23/23 0506 02/23/23 0747  BP:   (!) 167/72 131/63  Pulse:    74  Resp:    16  Temp: 99.5 F (37.5 C) 98.2 F (36.8 C)  98.8 F (37.1 C)  TempSrc: Axillary Oral  Oral  SpO2:  93%  93%  Weight:  98.8 kg    Height:       Supplemental O2: cpap SpO2: 93 % O2 Flow Rate (L/min): 3 L/min   Physical Exam:  Physical Exam Constitutional:      General: She is not in acute distress.    Appearance: She is not ill-appearing.  HENT:     Head: Normocephalic.     Comments: Nasal packing in place, dried red blood across packing Eyes:     General:        Right eye: No discharge.        Left eye: No discharge.  Cardiovascular:     Rate and Rhythm: Normal rate.     Heart sounds: No murmur (3/6 systolic murmur.  Could be from her right upper AV fistula) heard.    Comments: R AVF with palpable thrill and audible murmur Pulmonary:     Effort: Pulmonary effort is normal.     Comments: Diffusely diminished bilateral breath sound bases>apices, RLL crackles Abdominal:     General: Bowel sounds are normal.     Palpations: Abdomen  is soft.  Musculoskeletal:     Right lower leg: No edema.     Left lower leg: No edema.     Comments: Palpable thrill of right AV fistula  Skin:    General: Skin is warm and dry.  Neurological:     Mental Status: She is alert. Mental status is at baseline.  Psychiatric:        Mood and Affect: Mood normal.     Filed Weights   02/21/23 0820 02/22/23 0500 02/23/23 0505  Weight: 102.5 kg 103.8 kg 98.8 kg     Intake/Output Summary (Last 24 hours) at 02/23/2023 0945 Last data filed at 02/23/2023 0748 Gross per 24 hour  Intake 666 ml  Output 8050 ml  Net -7384 ml    Net IO Since Admission: -7,504 mL [02/23/23 0945]  Pertinent Labs:    Latest Ref Rng & Units 02/23/2023   12:33 AM 02/22/2023    9:46 AM 02/21/2023    2:17 PM  CBC  WBC 4.0 - 10.5 K/uL 6.0  8.0    Hemoglobin 12.0 - 15.0 g/dL 9.7  9.4  10.5   Hematocrit 36.0 - 46.0 % 28.7  28.2  31.0   Platelets 150 - 400 K/uL 205  207         Latest Ref Rng & Units 02/23/2023   12:33 AM 02/22/2023    7:24 AM 02/21/2023    2:17 PM  CMP  Glucose 70 - 99 mg/dL 148  123    BUN 6 - 20 mg/dL 88  80    Creatinine 0.44 - 1.00 mg/dL 5.21  5.33    Sodium 135 - 145 mmol/L 133  134  134   Potassium 3.5 - 5.1 mmol/L 4.9  5.7  6.0   Chloride 98 - 111 mmol/L 104  104    CO2 22 - 32 mmol/L 18  17    Calcium 8.9 - 10.3 mg/dL 8.4  8.6      Imaging: DG CHEST PORT 1 VIEW  Result Date: 02/22/2023 CLINICAL DATA:  [id number] Follow-up exam [id number] EXAM: PORTABLE CHEST 1 VIEW COMPARISON:  February 21, 2023 FINDINGS: Evaluation is limited by patient positioning. The cardiomediastinal silhouette is unchanged in contour. Similar perihilar vascular congestion. Favor small LEFT pleural effusion with incomplete visualization of the LEFT costophrenic angle. No pneumothorax. Persistent interstitial prominence, favored minimally improved. IMPRESSION: 1. Favored minimally improved pulmonary edema with likely small LEFT pleural effusion. 2. Similar enlargement of  the pulmonary arteries. Electronically Signed   By: Valentino Saxon M.D.   On: 02/22/2023 12:10    Assessment/Plan:   Principal Problem:   Hypervolemia associated with renal insufficiency Active Problems:   Diabetes mellitus with hyperglycemia (HCC)   HTN (hypertension)   Legally blind   Acute renal failure superimposed on stage 5 chronic kidney disease, not on chronic dialysis (HCC)   Anemia of chronic renal failure, stage 5 (HCC)   Hyperlipidemia associated with type 2 diabetes mellitus (HCC)   Ductal carcinoma in situ (DCIS) of left breast   Metabolic acidosis   Hyperkalemia   Sinus bradycardia   Acute renal failure superimposed on stage 5 chronic kidney disease, not on chronic dialysis, unspecified acute renal failure type [facility])   Patient Summary: Sarah Kline is a 57 y.o. living with visual blindness, T2DM, HTN, HLD, hx of DCIS of L breast s/p lumpectomy (2023; currently on tamoxifen), CKD V, and recent septoplasty and bilateral partial inferior turbinate resection on 02/20/2023, presenting with [facility], admitted for worsening renal function and progression towards ESRD.  Acute hypoxic respiratory failure 2/2 pulmonary edema from AKI on CKD Recent nasal septoplastyepistaxis Continues to sat around 88% with no [place] in place. Lungs continue to have crackles at the R base. Unclear how much her pulmonary edema is driving hypoxia vs her recent septoplast with epistaxis and nasal packing. Dr. Benjamine Mola ENT to evaluate her today for her epistaxis. Continuing diuresis for volume control. -Continue IV Lasix and Metolazone per nephrology -amoxicillin 500mg  q12 for 2 more doses -ambulatory pulse ox prior to d/c   AKI CKD 5 HyperkalemiaMetabolic acidosis with mildly elevated AG Unclear etiology of her acute worsening kidney function, creatinine stable 5.33 to 5.21, BUN stable 80 to 88 . No evidence of ATN given 2.2L urine output yesterday with net 6L output.  Potassium is trending down  slowly at 4.9 from 5.1. Of note has not had a bowel movement since Thursday 3/21. Patient reports AVF placement 1 year ago, palpable thrill and audible bruit on exam. No dialysis at this time given good response to lasix, well controlled acidosis, volume status, electrolytes. Will continue to follow up nephrology recommendations. -Continue IV Lasix and Metolazone per nephrology -Continue Lokelma 10 mg 3 times daily -Sodium Bicarb 650  mg BID -nephrology following -daily renal function panel   Asymptomatic sinus bradycardia - improving Likely due to acute hyperkalemia.  Heart rate remains in the 70s and patient is without any symptoms. -telemetry -holding coreg    Anemia of chronic renal disease Her hemoglobin stable with baseline around 9-10.  Ferritin is inappropriately normal at 79, which suggests a component of iron deficiency anemia.  Will hold off on IV iron in the setting of volume overload.   T2DM with hyperglycemia A1c 7.3% on 02/17/2023.  -sensitive SSI (renal)   HTN HLD -Holding Coreg and losartan in the setting of bradycardia and AKI. BP with systolics to [id number] primaryily, continue holding amlodipine. -continue home lipitor   DCIS L breast s/p lumpectomy (2023) She follows Dr. Lindi Adie with oncology for this. Last visit 07/2022. She underwent lumpectomy and adjuvant radiation therapy (finished 07/30/2022). She is currently on antiestrogen therapy with tamoxifen which Dr. Lindi Adie recommended to continue to for 5 years. -continue home tamoxifen 10mg  daily  Diet: Renal IVF: None,None VTE: Heparin Code: Full PT/OT recs: None, none. TOC recs:   Dispo: Anticipated discharge to Home in 2 days pending improvement of kidney function.   Iona Coach, MD 02/23/2023, 9:45 AM Pager: [phone]  Please contact the on call pager after 5 pm and on weekends at [phone].

## 2023-02-23 NOTE — Progress Notes (Signed)
  Transition of Care [facility]) Screening Note   Patient Details  Name: Sarah Kline Date of Birth: 18-Nov-1966   Transition of Care [facility]) CM/SW Contact:    Cyndi Bender, RN Phone Number: 02/23/2023, 12:53 PM    Transition of Care Department [facility]) has reviewed patient and no TOC needs have been identified at this time. We will continue to monitor patient advancement through interdisciplinary progression rounds. If new patient transition needs arise, please place a TOC consult.

## 2023-02-23 NOTE — Progress Notes (Signed)
Patient ID: Sarah Kline, female   DOB: 1966/04/05, 57 y.o.   MRN: [id number] [facility] Progress Note   Assessment/ Plan:   1. Acute kidney Injury on chronic kidney disease stage V: Baseline creatinine usually ranges 3.6-3.9 when she presented with what appears to be hemodynamically mediated acute kidney injury in the setting of CHF exacerbation.  Robust urine output overnight of 6.9 L with essentially unchanged BUN/creatinine.  She does not have any acute indication for dialysis and has a mature right brachiocephalic fistula in place that can be cannulated when needed for dialysis.  Some concern raised by her recurrent epistaxis with recent nasal septoplasty/bilateral turbinate reduction; may need desmopressin if epistaxis persists in the setting of her azotemia. 2.  Acute exacerbation of congestive heart failure: Significant diuresis noted overnight with symptomatic clinical improvement.  Will begin decreasing furosemide dose tomorrow and attempt to transition to oral diuretics over the next 48-72 hours. 3.  Epistaxis: Status post recent septoplasty/bilateral turbinate reduction.  Awaiting reevaluation by Dr. Benjamine Mola this morning. 4.  Hyperkalemia: Corrected with a combination of renal diet, Lokelma and diuresis.  Subjective:   Reports to be feeling fair except for facial pain/epistaxis overnight.  Denies chest pain or shortness of breath.   Objective:   BP 131/63 (BP Location: Left Arm)   Pulse 74   Temp 98.8 F (37.1 C) (Oral)   Resp 16   Ht 5\' 5"  (1.651 m)   Wt 98.8 kg   LMP 04/28/2018   SpO2 93%   BMI 36.25 kg/m   Intake/Output Summary (Last 24 hours) at 02/23/2023 1101 Last data filed at 02/23/2023 [id number] Gross per 24 hour  Intake 666 ml  Output 8050 ml  Net -7384 ml   Weight change: -3.713 kg  Physical Exam: Gen: Comfortably resting in bed, husband at bedside CVS: Pulse regular rhythm, normal rate, S1 and S2 normal Resp: Clear to auscultation bilaterally  without distinct rales or rhonchi Abd: Soft, flat, nontender, bowel sounds normal  Ext: No palpable lower extremity edema, right brachiocephalic fistula with palpable thrill  Imaging: DG CHEST PORT 1 VIEW  Result Date: 02/22/2023 CLINICAL DATA:  [id number] Follow-up exam [id number] EXAM: PORTABLE CHEST 1 VIEW COMPARISON:  February 21, 2023 FINDINGS: Evaluation is limited by patient positioning. The cardiomediastinal silhouette is unchanged in contour. Similar perihilar vascular congestion. Favor small LEFT pleural effusion with incomplete visualization of the LEFT costophrenic angle. No pneumothorax. Persistent interstitial prominence, favored minimally improved. IMPRESSION: 1. Favored minimally improved pulmonary edema with likely small LEFT pleural effusion. 2. Similar enlargement of the pulmonary arteries. Electronically Signed   By: Valentino Saxon M.D.   On: 02/22/2023 12:10   ECHOCARDIOGRAM COMPLETE  Result Date: 02/22/2023    ECHOCARDIOGRAM REPORT   Patient Name:   Sarah Kline Date of Exam: 02/22/2023 Medical Rec #:  [id number]          Height:       65.0 in Accession #:    [id number]         Weight:       228.8 lb Date of Birth:  05-03-1966         BSA:          2.095 m Patient Age:    14 years           BP:           145/60 mmHg Patient Gender: F  HR:           64 bpm. Exam Location:  Inpatient Procedure: 2D Echo, Cardiac Doppler and Color Doppler Indications:    I27.2 Cardiomegaly; I27.2 Pulmonary hypertension  History:        Patient has prior history of Echocardiogram examinations, most                 recent 01/20/2017. Risk Factors:Diabetes, Hypertension,                 Dyslipidemia and Non-Smoker.  Sonographer:    Wilkie Aye RVT RCS Referring Phys: [id number] EMILY B MULLEN  Sonographer Comments: Suboptimal parasternal window. Image acquisition challenging due to patient body habitus. IMPRESSIONS  1. Left ventricular ejection fraction, by estimation, is 60 to 65%. The left  ventricle has normal function. The left ventricle has no regional wall motion abnormalities. There is moderate left ventricular hypertrophy. Left ventricular diastolic parameters are consistent with Grade II diastolic dysfunction (pseudonormalization). Elevated left ventricular end-diastolic pressure.  2. Right ventricular systolic function is mildly reduced. The right ventricular size is mildly enlarged.  3. Left atrial size was mildly dilated.  4. The mitral valve is normal in structure. No evidence of mitral valve regurgitation. No evidence of mitral stenosis.  5. Tricuspid valve regurgitation is moderate to severe.  6. The aortic valve is tricuspid. There is mild calcification of the aortic valve. There is mild thickening of the aortic valve. Aortic valve regurgitation is trivial. Aortic valve sclerosis is present, with no evidence of aortic valve stenosis.  7. The inferior vena cava is normal in size with greater than 50% respiratory variability, suggesting right atrial pressure of 3 mmHg. FINDINGS  Left Ventricle: Left ventricular ejection fraction, by estimation, is 60 to 65%. The left ventricle has normal function. The left ventricle has no regional wall motion abnormalities. The left ventricular internal cavity size was normal in size. There is  moderate left ventricular hypertrophy. Left ventricular diastolic parameters are consistent with Grade II diastolic dysfunction (pseudonormalization). Elevated left ventricular end-diastolic pressure. Right Ventricle: The right ventricular size is mildly enlarged. No increase in right ventricular wall thickness. Right ventricular systolic function is mildly reduced. Left Atrium: Left atrial size was mildly dilated. Right Atrium: Right atrial size was normal in size. Pericardium: Trivial pericardial effusion is present. The pericardial effusion is posterior to the left ventricle. Mitral Valve: The mitral valve is normal in structure. No evidence of mitral valve  regurgitation. No evidence of mitral valve stenosis. Tricuspid Valve: The tricuspid valve is normal in structure. Tricuspid valve regurgitation is moderate to severe. No evidence of tricuspid stenosis. Aortic Valve: The aortic valve is tricuspid. There is mild calcification of the aortic valve. There is mild thickening of the aortic valve. Aortic valve regurgitation is trivial. Aortic valve sclerosis is present, with no evidence of aortic valve stenosis. Aortic valve mean gradient measures 10.8 mmHg. Aortic valve peak gradient measures 18.9 mmHg. Aortic valve area, by VTI measures 1.81 cm. Pulmonic Valve: The pulmonic valve was normal in structure. Pulmonic valve regurgitation is mild. No evidence of pulmonic stenosis. Aorta: The aortic root is normal in size and structure. Venous: The inferior vena cava is normal in size with greater than 50% respiratory variability, suggesting right atrial pressure of 3 mmHg. IAS/Shunts: No atrial level shunt detected by color flow Doppler.  LEFT VENTRICLE PLAX 2D LVIDd:         3.70 cm   Diastology LVIDs:  2.50 cm   LV e' medial:    4.88 cm/s LV PW:         1.50 cm   LV E/e' medial:  26.0 LV IVS:        1.50 cm   LV e' lateral:   5.84 cm/s LVOT diam:     1.70 cm   LV E/e' lateral: 21.7 LV SV:         81 LV SV Index:   39 LVOT Area:     2.27 cm  RIGHT VENTRICLE             IVC RV Basal diam:  4.10 cm     IVC diam: 1.90 cm RV S prime:     10.60 cm/s TAPSE (M-mode): 2.7 cm LEFT ATRIUM           Index        RIGHT ATRIUM           Index LA diam:      3.80 cm 1.81 cm/m   RA Area:     21.20 cm LA Vol (A4C): 48.2 ml 23.01 ml/m  RA Volume:   71.00 ml  33.90 ml/m  AORTIC VALVE                     PULMONIC VALVE AV Area (Vmax):    1.77 cm      PV Vmax:          1.16 m/s AV Area (Vmean):   1.72 cm      PV Peak grad:     5.4 mmHg AV Area (VTI):     1.81 cm      PR End Diast Vel: 7.18 msec AV Vmax:           217.50 cm/s AV Vmean:          149.500 cm/s AV VTI:            0.448  m AV Peak Grad:      18.9 mmHg AV Mean Grad:      10.8 mmHg LVOT Vmax:         170.00 cm/s LVOT Vmean:        113.000 cm/s LVOT VTI:          0.358 m LVOT/AV VTI ratio: 0.80  AORTA Ao Root diam: 2.80 cm Ao Asc diam:  3.60 cm Ao Arch diam: 3.0 cm MITRAL VALVE                TRICUSPID VALVE MV Area (PHT): 2.09 cm     TR Peak grad:   20.8 mmHg MV Decel Time: 363 msec     TR Vmax:        228.00 cm/s MV E velocity: 127.00 cm/s MV A velocity: 132.00 cm/s  SHUNTS MV E/A ratio:  0.96         Systemic VTI:  0.36 m                             Systemic Diam: 1.70 cm Jenkins Rouge MD Electronically signed by Jenkins Rouge MD Signature Date/Time: 02/22/2023/10:19:34 AM    Final    US RENAL  Result Date: 02/21/2023 CLINICAL DATA:  Acute on chronic renal failure EXAM: RENAL / URINARY TRACT ULTRASOUND COMPLETE COMPARISON:  01/29/2018 FINDINGS: Right Kidney: Renal measurements: 8.1 x 4.0 x 3.4 cm. = volume: 57 mL. No hydronephrosis is noted. Cortical thinning is  seen as well as increased echogenicity consistent with the given clinical history. Left Kidney: Renal measurements: 6.7 x 4.0 x 3.4 cm. = volume: 47 mL. Increased echogenicity is noted. No hydronephrosis is seen. Bladder: Decompressed Other: None. IMPRESSION: Small kidneys with increased echogenicity consistent with medical renal disease. No obstructive changes are noted. Electronically Signed   By: Inez Catalina M.D.   On: 02/21/2023 20:45    Labs: BMET Recent Labs  Lab 02/17/23 1140 02/20/23 1125 02/21/23 0852 02/21/23 1402 02/21/23 1417 02/22/23 0724 02/23/23 0033  NA 141 142 135  --  134* 134* 133*  K 5.5* 5.2* 6.5* 5.9* 6.0* 5.7* 4.9  CL 112* 113* 105  --   --  104 104  CO2 20*  --  17*  --   --  17* 18*  GLUCOSE 170* 166* 245*  --   --  123* 148*  BUN 58* 49* 69*  --   --  80* 88*  CREATININE 3.74* 4.10* 4.76*  --   --  5.33* 5.21*  CALCIUM 8.9  --  9.0  --   --  8.6* 8.4*  PHOS  --   --   --  7.4*  --  6.4*  --    CBC Recent Labs  Lab  02/17/23 1140 02/20/23 1125 02/21/23 0852 02/21/23 1417 02/22/23 0946 02/23/23 0033  WBC 5.2  --  8.0  --  8.0 6.0  NEUTROABS  --   --  7.2  --   --   --   HGB 9.8*   < > 9.7* 10.5* 9.4* 9.7*  HCT 30.0*   < > 31.2* 31.0* 28.2* 28.7*  MCV 92.0  --  96.6  --  88.7 89.4  PLT 220  --  195  --  207 205   < > = values in this interval not displayed.    Medications:     atorvastatin  40 mg Oral QHS   calcitRIOL  0.25 mcg Oral QODAY   insulin aspart  0-9 Units Subcutaneous TID WC   metolazone  5 mg Oral Daily   polyethylene glycol  17 g Oral Daily   sodium bicarbonate  650 mg Oral BID   sodium zirconium cyclosilicate  10 g Oral TID   tamoxifen  10 mg Oral Daily   Elmarie Shiley, MD 02/23/2023, 11:01 AM

## 2023-02-24 DIAGNOSIS — J9601 Acute respiratory failure with hypoxia: Secondary | ICD-10-CM | POA: Diagnosis not present

## 2023-02-24 DIAGNOSIS — N185 Chronic kidney disease, stage 5: Secondary | ICD-10-CM | POA: Diagnosis not present

## 2023-02-24 DIAGNOSIS — N179 Acute kidney failure, unspecified: Secondary | ICD-10-CM | POA: Diagnosis not present

## 2023-02-24 DIAGNOSIS — I5030 Unspecified diastolic (congestive) heart failure: Secondary | ICD-10-CM | POA: Diagnosis not present

## 2023-02-24 LAB — GLUCOSE, CAPILLARY
Glucose-Capillary: 168 mg/dL — ABNORMAL HIGH (ref 70–99)
Glucose-Capillary: 167 mg/dL — ABNORMAL HIGH (ref 70–99)
Glucose-Capillary: 165 mg/dL — ABNORMAL HIGH (ref 70–99)
Glucose-Capillary: 182 mg/dL — ABNORMAL HIGH (ref 70–99)

## 2023-02-24 LAB — RENAL FUNCTION PANEL
Albumin: 3.6 g/dL (ref 3.5–5.0)
Anion gap: 14 (ref 5–15)
BUN: 90 mg/dL — ABNORMAL HIGH (ref 6–20)
CO2: 22 mmol/L (ref 22–32)
Calcium: 8.8 mg/dL — ABNORMAL LOW (ref 8.9–10.3)
Chloride: 98 mmol/L (ref 98–111)
Creatinine, Ser: 4.74 mg/dL — ABNORMAL HIGH (ref 0.44–1.00)
GFR, Estimated: 10 mL/min — ABNORMAL LOW (ref >60)
Glucose, Bld: 204 mg/dL — ABNORMAL HIGH (ref 70–99)
Phosphorus: 5.7 mg/dL — ABNORMAL HIGH (ref 2.5–4.6)
Potassium: 4.6 mmol/L (ref 3.5–5.1)
Sodium: 134 mmol/L — ABNORMAL LOW (ref 135–145)

## 2023-02-24 LAB — CBC
HCT: 29.6 % — ABNORMAL LOW (ref 36.0–46.0)
Hemoglobin: 10.3 g/dL — ABNORMAL LOW (ref 12.0–15.0)
MCH: 30.3 pg (ref 26.0–34.0)
MCHC: 34.8 g/dL (ref 30.0–36.0)
MCV: 87.1 fL (ref 80.0–100.0)
Platelets: 194 10*3/uL (ref 150–400)
RBC: 3.4 MIL/uL — ABNORMAL LOW (ref 3.87–5.11)
RDW: 14.3 % (ref 11.5–15.5)
WBC: 4.9 10*3/uL (ref 4.0–10.5)
nRBC: 0 % (ref 0.0–0.2)

## 2023-02-24 MED ORDER — FUROSEMIDE 10 MG/ML IJ SOLN
120.0000 mg | Freq: Three times a day (TID) | INTRAVENOUS | Status: DC
  Administered 2023-02-24 – 2023-02-25 (×3): 120 mg via INTRAVENOUS
  Filled 2023-02-24: qty 10
  Filled 2023-02-24: qty 12
  Filled 2023-02-24 (×2): qty 10
  Filled 2023-02-24: qty 12

## 2023-02-24 MED ORDER — HEPARIN SODIUM (PORCINE) 5000 UNIT/ML IJ SOLN
5000.0000 [IU] | Freq: Three times a day (TID) | INTRAMUSCULAR | Status: DC
  Administered 2023-02-24 – 2023-02-27 (×8): 5000 [IU] via SUBCUTANEOUS
  Filled 2023-02-24 (×10): qty 1

## 2023-02-24 NOTE — Progress Notes (Signed)
Patient ID: Sarah Kline, female   DOB: 05-05-1966, 57 y.o.   MRN: [id number] [facility] Progress Note   Assessment/ Plan:   1. Acute kidney Injury on chronic kidney disease stage V: Baseline creatinine usually ranges 3.6-3.9 when she presented with what appears to be hemodynamically mediated acute kidney injury in the setting of CHF exacerbation.  Again with an excellent urine output overnight recorded at 6.9 L; will begin decreasing furosemide dose to limit risk of intravascular volume contraction-dose adjusted to 120 mg 3 times daily from 160 3 times daily. 2.  Acute exacerbation of congestive heart failure: Significant diuresis noted overnight with symptomatic clinical improvement.  Furosemide decreased with goal to transition to oral therapy in the next 24-48 hours. 3.  Epistaxis: Status post recent septoplasty/bilateral turbinate reduction.  Seen yesterday by ENT surgery with plans for outpatient follow-up. 4.  Hyperkalemia: Corrected with a combination of renal diet, Lokelma and diuresis.  Subjective:   Continues to have intermittent nasal/paranasal pain but denies any chest pain or shortness of breath.  No additional epistaxis noted from overnight.   Objective:   BP (!) 145/82 (BP Location: Left Arm)   Pulse 66   Temp 98.3 F (36.8 C) (Oral)   Resp 14   Ht 5\' 5"  (1.651 m)   Wt 93.6 kg   LMP 04/28/2018   SpO2 91%   BMI 34.34 kg/m   Intake/Output Summary (Last 24 hours) at 02/24/2023 [id number] Last data filed at 02/24/2023 [id number] Gross per 24 hour  Intake 570 ml  Output 5775 ml  Net -5205 ml   Weight change: -5.2 kg  Physical Exam: Gen: Comfortably resting in bed, husband at bedside CVS: Pulse regular rhythm, normal rate, S1 and S2 normal Resp: Clear to auscultation bilaterally without distinct rales or rhonchi Abd: Soft, flat, nontender, bowel sounds normal  Ext: No palpable lower extremity edema, right brachiocephalic fistula with palpable  thrill  Imaging: DG CHEST PORT 1 VIEW  Result Date: 02/22/2023 CLINICAL DATA:  [id number] Follow-up exam [id number] EXAM: PORTABLE CHEST 1 VIEW COMPARISON:  February 21, 2023 FINDINGS: Evaluation is limited by patient positioning. The cardiomediastinal silhouette is unchanged in contour. Similar perihilar vascular congestion. Favor small LEFT pleural effusion with incomplete visualization of the LEFT costophrenic angle. No pneumothorax. Persistent interstitial prominence, favored minimally improved. IMPRESSION: 1. Favored minimally improved pulmonary edema with likely small LEFT pleural effusion. 2. Similar enlargement of the pulmonary arteries. Electronically Signed   By: Valentino Saxon M.D.   On: 02/22/2023 12:10   ECHOCARDIOGRAM COMPLETE  Result Date: 02/22/2023    ECHOCARDIOGRAM REPORT   Patient Name:   Sarah Kline Date of Exam: 02/22/2023 Medical Rec #:  [id number]          Height:       65.0 in Accession #:    [id number]         Weight:       228.8 lb Date of Birth:  23-Sep-1966         BSA:          2.095 m Patient Age:    18 years           BP:           145/60 mmHg Patient Gender: F                  HR:           64 bpm. Exam Location:  Inpatient Procedure: 2D Echo, Cardiac Doppler  and Color Doppler Indications:    I27.2 Cardiomegaly; I27.2 Pulmonary hypertension  History:        Patient has prior history of Echocardiogram examinations, most                 recent 01/20/2017. Risk Factors:Diabetes, Hypertension,                 Dyslipidemia and Non-Smoker.  Sonographer:    Wilkie Aye RVT RCS Referring Phys: [id number] EMILY B MULLEN  Sonographer Comments: Suboptimal parasternal window. Image acquisition challenging due to patient body habitus. IMPRESSIONS  1. Left ventricular ejection fraction, by estimation, is 60 to 65%. The left ventricle has normal function. The left ventricle has no regional wall motion abnormalities. There is moderate left ventricular hypertrophy. Left ventricular diastolic  parameters are consistent with Grade II diastolic dysfunction (pseudonormalization). Elevated left ventricular end-diastolic pressure.  2. Right ventricular systolic function is mildly reduced. The right ventricular size is mildly enlarged.  3. Left atrial size was mildly dilated.  4. The mitral valve is normal in structure. No evidence of mitral valve regurgitation. No evidence of mitral stenosis.  5. Tricuspid valve regurgitation is moderate to severe.  6. The aortic valve is tricuspid. There is mild calcification of the aortic valve. There is mild thickening of the aortic valve. Aortic valve regurgitation is trivial. Aortic valve sclerosis is present, with no evidence of aortic valve stenosis.  7. The inferior vena cava is normal in size with greater than 50% respiratory variability, suggesting right atrial pressure of 3 mmHg. FINDINGS  Left Ventricle: Left ventricular ejection fraction, by estimation, is 60 to 65%. The left ventricle has normal function. The left ventricle has no regional wall motion abnormalities. The left ventricular internal cavity size was normal in size. There is  moderate left ventricular hypertrophy. Left ventricular diastolic parameters are consistent with Grade II diastolic dysfunction (pseudonormalization). Elevated left ventricular end-diastolic pressure. Right Ventricle: The right ventricular size is mildly enlarged. No increase in right ventricular wall thickness. Right ventricular systolic function is mildly reduced. Left Atrium: Left atrial size was mildly dilated. Right Atrium: Right atrial size was normal in size. Pericardium: Trivial pericardial effusion is present. The pericardial effusion is posterior to the left ventricle. Mitral Valve: The mitral valve is normal in structure. No evidence of mitral valve regurgitation. No evidence of mitral valve stenosis. Tricuspid Valve: The tricuspid valve is normal in structure. Tricuspid valve regurgitation is moderate to severe. No  evidence of tricuspid stenosis. Aortic Valve: The aortic valve is tricuspid. There is mild calcification of the aortic valve. There is mild thickening of the aortic valve. Aortic valve regurgitation is trivial. Aortic valve sclerosis is present, with no evidence of aortic valve stenosis. Aortic valve mean gradient measures 10.8 mmHg. Aortic valve peak gradient measures 18.9 mmHg. Aortic valve area, by VTI measures 1.81 cm. Pulmonic Valve: The pulmonic valve was normal in structure. Pulmonic valve regurgitation is mild. No evidence of pulmonic stenosis. Aorta: The aortic root is normal in size and structure. Venous: The inferior vena cava is normal in size with greater than 50% respiratory variability, suggesting right atrial pressure of 3 mmHg. IAS/Shunts: No atrial level shunt detected by color flow Doppler.  LEFT VENTRICLE PLAX 2D LVIDd:         3.70 cm   Diastology LVIDs:         2.50 cm   LV e' medial:    4.88 cm/s LV PW:  1.50 cm   LV E/e' medial:  26.0 LV IVS:        1.50 cm   LV e' lateral:   5.84 cm/s LVOT diam:     1.70 cm   LV E/e' lateral: 21.7 LV SV:         81 LV SV Index:   39 LVOT Area:     2.27 cm  RIGHT VENTRICLE             IVC RV Basal diam:  4.10 cm     IVC diam: 1.90 cm RV S prime:     10.60 cm/s TAPSE (M-mode): 2.7 cm LEFT ATRIUM           Index        RIGHT ATRIUM           Index LA diam:      3.80 cm 1.81 cm/m   RA Area:     21.20 cm LA Vol (A4C): 48.2 ml 23.01 ml/m  RA Volume:   71.00 ml  33.90 ml/m  AORTIC VALVE                     PULMONIC VALVE AV Area (Vmax):    1.77 cm      PV Vmax:          1.16 m/s AV Area (Vmean):   1.72 cm      PV Peak grad:     5.4 mmHg AV Area (VTI):     1.81 cm      PR End Diast Vel: 7.18 msec AV Vmax:           217.50 cm/s AV Vmean:          149.500 cm/s AV VTI:            0.448 m AV Peak Grad:      18.9 mmHg AV Mean Grad:      10.8 mmHg LVOT Vmax:         170.00 cm/s LVOT Vmean:        113.000 cm/s LVOT VTI:          0.358 m LVOT/AV VTI ratio:  0.80  AORTA Ao Root diam: 2.80 cm Ao Asc diam:  3.60 cm Ao Arch diam: 3.0 cm MITRAL VALVE                TRICUSPID VALVE MV Area (PHT): 2.09 cm     TR Peak grad:   20.8 mmHg MV Decel Time: 363 msec     TR Vmax:        228.00 cm/s MV E velocity: 127.00 cm/s MV A velocity: 132.00 cm/s  SHUNTS MV E/A ratio:  0.96         Systemic VTI:  0.36 m                             Systemic Diam: 1.70 cm Jenkins Rouge MD Electronically signed by Jenkins Rouge MD Signature Date/Time: 02/22/2023/10:19:34 AM    Final     Labs: BMET Recent Labs  Lab 02/17/23 1140 02/20/23 1125 02/21/23 [id number] 02/21/23 1402 02/21/23 1417 02/22/23 0724 02/23/23 0033 02/24/23 0012  NA 141 142 135  --  134* 134* 133* 134*  K 5.5* 5.2* 6.5* 5.9* 6.0* 5.7* 4.9 4.6  CL 112* 113* 105  --   --  104 104 98  CO2 20*  --  17*  --   --  17* 18* 22  GLUCOSE 170* 166* 245*  --   --  123* 148* 204*  BUN 58* 49* 69*  --   --  80* 88* 90*  CREATININE 3.74* 4.10* 4.76*  --   --  5.33* 5.21* 4.74*  CALCIUM 8.9  --  9.0  --   --  8.6* 8.4* 8.8*  PHOS  --   --   --  7.4*  --  6.4*  --  5.7*   CBC Recent Labs  Lab 02/21/23 0852 02/21/23 1417 02/22/23 0946 02/23/23 0033 02/24/23 0012  WBC 8.0  --  8.0 6.0 4.9  NEUTROABS 7.2  --   --   --   --   HGB 9.7* 10.5* 9.4* 9.7* 10.3*  HCT 31.2* 31.0* 28.2* 28.7* 29.6*  MCV 96.6  --  88.7 89.4 87.1  PLT 195  --  207 205 194    Medications:     atorvastatin  40 mg Oral QHS   calcitRIOL  0.25 mcg Oral QODAY   insulin aspart  0-9 Units Subcutaneous TID WC   metolazone  5 mg Oral Daily   polyethylene glycol  17 g Oral Daily   sodium bicarbonate  650 mg Oral BID   tamoxifen  10 mg Oral Daily   Elmarie Shiley, MD 02/24/2023, 8:07 AM

## 2023-02-24 NOTE — Progress Notes (Signed)
HD#3 Subjective:  Overnight Events: no event  She does not feel short of breath. Denies any chest pain. She reports she has ben OOB to sit on the bedside commode. She has not been walking and is eager to do so.She states she has been urinating well but not a lot. Had a tiny bowel movement.She does report that she is eating well. Denies bleeding nose. She states that she did talk with the ENT doctor and the kidney doctor yesterday. Explained the plan to the patient and answered all of her questions.    Objective:  Vital signs in last 24 hours: Vitals:   02/23/23 2305 02/24/23 0615 02/24/23 0617 02/24/23 0810  BP: (!) 143/65 (!) 171/77 (!) 145/82 126/64  Pulse: 66   70  Resp: 14   17  Temp: 98.8 F (37.1 C) 98.3 F (36.8 C) 98.3 F (36.8 C) 98.6 F (37 C)  TempSrc: Oral Oral Oral Oral  SpO2: 91%   94%  Weight:   93.6 kg   Height:       Supplemental O2: cpap SpO2: 94 % O2 Flow Rate (L/min): 3 L/min   Physical Exam:  Physical Exam Constitutional:      General: She is not in acute distress.    Appearance: She is not ill-appearing.  HENT:     Head: Normocephalic.     Comments: Nasal packing in place, dried red blood across packing Eyes:     General:        Right eye: No discharge.        Left eye: No discharge.  Cardiovascular:     Rate and Rhythm: Normal rate.     Heart sounds: No murmur (3/6 systolic murmur.  Could be from her right upper AV fistula) heard.    Comments: R AVF with palpable thrill and audible murmur Pulmonary:     Effort: Pulmonary effort is normal.     Breath sounds: Examination of the right-middle field reveals decreased breath sounds. Examination of the left-middle field reveals decreased breath sounds. Examination of the right-lower field reveals decreased breath sounds. Examination of the left-lower field reveals decreased breath sounds. Decreased breath sounds present. No wheezing, rhonchi or rales.  Abdominal:     General: Bowel sounds are  normal.     Palpations: Abdomen is soft.  Musculoskeletal:     Right lower leg: No edema.     Left lower leg: No edema.     Comments: Palpable thrill of right AV fistula  Skin:    General: Skin is warm and dry.  Neurological:     Mental Status: She is alert. Mental status is at baseline.  Psychiatric:        Mood and Affect: Mood normal.     Filed Weights   02/22/23 0500 02/23/23 0505 02/24/23 0617  Weight: 103.8 kg 98.8 kg 93.6 kg     Intake/Output Summary (Last 24 hours) at 02/24/2023 1437 Last data filed at 02/24/2023 [id number] Gross per 24 hour  Intake --  Output 5775 ml  Net -5775 ml    Net IO Since Admission: -12,469 mL [02/24/23 1437]  Pertinent Labs:    Latest Ref Rng & Units 02/24/2023   12:12 AM 02/23/2023   12:33 AM 02/22/2023    9:46 AM  CBC  WBC 4.0 - 10.5 K/uL 4.9  6.0  8.0   Hemoglobin 12.0 - 15.0 g/dL 10.3  9.7  9.4   Hematocrit 36.0 - 46.0 % 29.6  28.7  28.2   Platelets 150 - 400 K/uL 194  205  207        Latest Ref Rng & Units 02/24/2023   12:12 AM 02/23/2023   12:33 AM 02/22/2023    7:24 AM  CMP  Glucose 70 - 99 mg/dL 204  148  123   BUN 6 - 20 mg/dL 90  88  80   Creatinine 0.44 - 1.00 mg/dL 4.74  5.21  5.33   Sodium 135 - 145 mmol/L 134  133  134   Potassium 3.5 - 5.1 mmol/L 4.6  4.9  5.7   Chloride 98 - 111 mmol/L 98  104  104   CO2 22 - 32 mmol/L 22  18  17    Calcium 8.9 - 10.3 mg/dL 8.8  8.4  8.6     Imaging: No results found.  Assessment/Plan:   Principal Problem:   Hypervolemia associated with renal insufficiency Active Problems:   Diabetes mellitus with hyperglycemia (HCC)   HTN (hypertension)   Legally blind   Acute renal failure superimposed on stage 5 chronic kidney disease, not on chronic dialysis (HCC)   Anemia of chronic renal failure, stage 5 (HCC)   Hyperlipidemia associated with type 2 diabetes mellitus (HCC)   Ductal carcinoma in situ (DCIS) of left breast   Metabolic acidosis   Hyperkalemia   Sinus bradycardia    Acute renal failure superimposed on stage 5 chronic kidney disease, not on chronic dialysis, unspecified acute renal failure type ([place])   Acute pulmonary edema (HCC)   Hypoxia   Acute renal failure ([place])   Patient Summary: Sarah Kline is a 57 y.o. living with visual blindness, T2DM, HTN, HLD, hx of DCIS of L breast s/p lumpectomy (2023; currently on tamoxifen), CKD V, and recent septoplasty and bilateral partial inferior turbinate resection on 02/20/2023, presenting with [facility], admitted for worsening renal function and progression towards ESRD.  Acute hypoxic respiratory failure 2/2 HFpEF Patient endorse no sob, although has not ambulated much.Continues to sat around 91-94% with no [place] in place, did require face mask 3L overnight. Interval improvement in bibasilar crackles, continues to have bibasilar diminished breath sounds. S/p nasal stent removal yesterday by ENT after septoplasty 3/22 with no improvement in hypoxia. Do think pulmonary edema is driving most of this. Has had -12L output over prior 2 days with weight from 98.8kg yesterday to 93.6kg today. Overall improving. Nephrology decreasing IV lasix today, plan to transition to PO in coming days. -Continue IV Lasix and Metolazone per nephrology -amoxicillin 500mg  q12 for 2 more doses -ambulatory pulse ox prior to d/c   AKI CKD 5 HyperkalemiaMetabolic acidosis with mildly elevated AG Per nephrology hemodynamic changes in the setting of HFpEF exacerbation likely etiology for renal dysfunction, creatinine improving 5.21to 4.74 but overall renal function stable.Continues to have good urine output on IV lasix and metolazone.Potassium stable at 4.6 off of lokelma. Bicarb stable at 22 on sodium bicarb. -Continue IV Lasix and Metolazone per nephrology -Continue Lokelma 10 mg 3 times daily -Sodium Bicarb 650 mg BID -nephrology following -daily renal function panel  Recent nasal septoplastyepistaxis Nasal splints removed 3/35 by Dr Leta Baptist with ENT. Bleeding is common after septoplasty. No futher ENT needs, ENT signed off.  -nasal drip pad   Asymptomatic sinus bradycardia - improving Likely due to acute hyperkalemia.  Heart rate remains in the 70s and patient is without any symptoms. -telemetry -holding coreg    Anemia of chronic renal disease Her hemoglobin stable with baseline  around 9-10.  Ferritin is inappropriately normal at 79, which suggests a component of iron deficiency anemia.  Will hold off on IV iron in the setting of volume overload.   T2DM with hyperglycemia A1c 7.3% on 02/17/2023.  -sensitive SSI (renal)   HTN HLD -Holding Coreg and losartan in the setting of bradycardia and AKI. BP with systolics to [id number] primarily, continue holding amlodipine. -continue home lipitor   DCIS L breast s/p lumpectomy (2023) She follows Dr. Lindi Adie with oncology for this. Last visit 07/2022. She underwent lumpectomy and adjuvant radiation therapy (finished 07/30/2022). She is currently on antiestrogen therapy with tamoxifen which Dr. Lindi Adie recommended to continue to for 5 years. -continue home tamoxifen 10mg  daily  Diet: Renal IVF: None,None VTE: Heparin Code: Full PT/OT recs: None, none. TOC recs:   Dispo: Anticipated discharge to Home in 2 days pending improvement of kidney function.   Iona Coach, MD 02/24/2023, 2:37 PM Pager: [phone]  Please contact the on call pager after 5 pm and on weekends at [phone].

## 2023-02-25 DIAGNOSIS — I5030 Unspecified diastolic (congestive) heart failure: Secondary | ICD-10-CM | POA: Diagnosis not present

## 2023-02-25 DIAGNOSIS — N185 Chronic kidney disease, stage 5: Secondary | ICD-10-CM | POA: Diagnosis not present

## 2023-02-25 DIAGNOSIS — N179 Acute kidney failure, unspecified: Secondary | ICD-10-CM | POA: Diagnosis not present

## 2023-02-25 DIAGNOSIS — J9601 Acute respiratory failure with hypoxia: Secondary | ICD-10-CM | POA: Diagnosis not present

## 2023-02-25 LAB — GLUCOSE, CAPILLARY
Glucose-Capillary: 193 mg/dL — ABNORMAL HIGH (ref 70–99)
Glucose-Capillary: 169 mg/dL — ABNORMAL HIGH (ref 70–99)
Glucose-Capillary: 217 mg/dL — ABNORMAL HIGH (ref 70–99)
Glucose-Capillary: 218 mg/dL — ABNORMAL HIGH (ref 70–99)

## 2023-02-25 LAB — RENAL FUNCTION PANEL
Albumin: 3.5 g/dL (ref 3.5–5.0)
Anion gap: 17 — ABNORMAL HIGH (ref 5–15)
BUN: 104 mg/dL — ABNORMAL HIGH (ref 6–20)
CO2: 25 mmol/L (ref 22–32)
Calcium: 9.1 mg/dL (ref 8.9–10.3)
Chloride: 91 mmol/L — ABNORMAL LOW (ref 98–111)
Creatinine, Ser: 4.77 mg/dL — ABNORMAL HIGH (ref 0.44–1.00)
GFR, Estimated: 10 mL/min — ABNORMAL LOW (ref >60)
Glucose, Bld: 252 mg/dL — ABNORMAL HIGH (ref 70–99)
Phosphorus: 5.3 mg/dL — ABNORMAL HIGH (ref 2.5–4.6)
Potassium: 4.1 mmol/L (ref 3.5–5.1)
Sodium: 133 mmol/L — ABNORMAL LOW (ref 135–145)

## 2023-02-25 LAB — MAGNESIUM: Magnesium: 1.9 mg/dL (ref 1.7–2.4)

## 2023-02-25 MED ORDER — FUROSEMIDE 40 MG PO TABS: 80.0000 mg | ORAL_TABLET | Freq: Two times a day (BID) | ORAL | Status: DC

## 2023-02-25 MED ORDER — AMLODIPINE BESYLATE 10 MG PO TABS: 10.0000 mg | ORAL_TABLET | Freq: Every day | ORAL | Status: DC

## 2023-02-25 MED ORDER — AMLODIPINE BESYLATE 5 MG PO TABS
5.0000 mg | ORAL_TABLET | Freq: Every day | ORAL | Status: DC
  Administered 2023-02-25 – 2023-02-27 (×3): 5 mg via ORAL
  Filled 2023-02-25 (×3): qty 1

## 2023-02-25 MED ORDER — FUROSEMIDE 40 MG PO TABS
80.0000 mg | ORAL_TABLET | Freq: Two times a day (BID) | ORAL | Status: DC
  Administered 2023-02-25: 80 mg via ORAL
  Filled 2023-02-25: qty 2

## 2023-02-25 NOTE — Progress Notes (Signed)
Patient ID: Sarah Kline, female   DOB: November 16, 1966, 57 y.o.   MRN: [id number] [facility] Progress Note   Assessment/ Plan:   1. Acute kidney Injury on chronic kidney disease stage V: Baseline creatinine usually ranges 3.6-3.9 when she presented with what appears to be hemodynamically mediated acute kidney injury in the setting of CHF exacerbation.  Urine output slowed down after decreased furosemide dosing; labs this morning notable for higher BUN/worsening azotemia and lower sodium level; the former of which likely reflects that we are likely tipping her towards volume contraction from diuresis.  Will switch to oral furosemide today 80 mg twice daily (next dose 1800 after getting furosemide infusion earlier) and discontinue metolazone.  If labs permit, she may be able to be discharged home tomorrow (previously not on any diuretics prior to admission and it may be prudent to discharge her on 40-80 mg once a day). 2.  Acute exacerbation of congestive heart failure: Significant diuresis noted overnight with symptomatic clinical improvement.  Furosemide switched to oral dosing today. 3.  Epistaxis: Status post recent septoplasty/bilateral turbinate reduction.  Seen yesterday by ENT surgery with plans for outpatient follow-up. 4.  Hyponatremia: Likely secondary to free water excretion defect in the setting of acute kidney injury/CHF exacerbation.  Plausibly worsened by metolazone.  Subjective:   Denies any chest pain or shortness of breath, able to ambulate unit without problems.  Denies any epistaxis but has intermittent nasal pain.   Objective:   BP 122/65 (BP Location: Left Arm)   Pulse 68   Temp 98.5 F (36.9 C) (Oral)   Resp 14   Ht 5\' 5"  (1.651 m)   Wt 91.1 kg   LMP 04/28/2018   SpO2 95%   BMI 33.42 kg/m   Intake/Output Summary (Last 24 hours) at 02/25/2023 0805 Last data filed at 02/25/2023 [id number] Gross per 24 hour  Intake 230 ml  Output 2700 ml  Net -2470 ml    Weight change: -2.5 kg  Physical Exam: Gen: Awake, alert and resting comfortably in bed CVS: Pulse regular rhythm, normal rate, S1 and S2 normal Resp: Clear to auscultation bilaterally without distinct rales or rhonchi Abd: Soft, flat, nontender, bowel sounds normal  Ext: No palpable lower extremity edema, right brachiocephalic fistula with palpable thrill  Imaging: No results found.  Labs: BMET Recent Labs  Lab 02/20/23 1125 02/21/23 0852 02/21/23 1402 02/21/23 1417 02/22/23 0724 02/23/23 0033 02/24/23 0012 02/25/23 0028  NA 142 135  --  134* 134* 133* 134* 133*  K 5.2* 6.5* 5.9* 6.0* 5.7* 4.9 4.6 4.1  CL 113* 105  --   --  104 104 98 91*  CO2  --  17*  --   --  17* 18* 22 25  GLUCOSE 166* 245*  --   --  123* 148* 204* 252*  BUN 49* 69*  --   --  80* 88* 90* 104*  CREATININE 4.10* 4.76*  --   --  5.33* 5.21* 4.74* 4.77*  CALCIUM  --  9.0  --   --  8.6* 8.4* 8.8* 9.1  PHOS  --   --  7.4*  --  6.4*  --  5.7* 5.3*   CBC Recent Labs  Lab 02/21/23 0852 02/21/23 1417 02/22/23 0946 02/23/23 0033 02/24/23 0012  WBC 8.0  --  8.0 6.0 4.9  NEUTROABS 7.2  --   --   --   --   HGB 9.7* 10.5* 9.4* 9.7* 10.3*  HCT 31.2* 31.0* 28.2*  28.7* 29.6*  MCV 96.6  --  88.7 89.4 87.1  PLT 195  --  207 205 194    Medications:     atorvastatin  40 mg Oral QHS   calcitRIOL  0.25 mcg Oral QODAY   furosemide  80 mg Oral BID   heparin  5,000 Units Subcutaneous Q8H   insulin aspart  0-9 Units Subcutaneous TID WC   polyethylene glycol  17 g Oral Daily   sodium bicarbonate  650 mg Oral BID   tamoxifen  10 mg Oral Daily   Elmarie Shiley, MD 02/25/2023, 8:05 AM

## 2023-02-25 NOTE — Progress Notes (Signed)
HD#4 Subjective:  Overnight Events: no event  She was up and walking yesterday with no concerns. She denies any chest pain or any cough. She reports that she is using the bathroom well. She has no other concerns this morning. Patient states that she did speak with the nephrologist who states they are just watching her labs.   Objective:  Vital signs in last 24 hours: Vitals:   02/25/23 0313 02/25/23 0620 02/25/23 0742 02/25/23 1134  BP: (!) 146/73  122/65 (!) 136/55  Pulse: 65 67 68 73  Resp: 18  14 18   Temp: 98.6 F (37 C)  98.5 F (36.9 C)   TempSrc: Oral  Oral   SpO2: 99% 97% 95% 96%  Weight:  91.1 kg    Height:       Supplemental O2: cpap SpO2: 96 % O2 Flow Rate (L/min): 3 L/min   Physical Exam:  Physical Exam Constitutional:      General: She is not in acute distress.    Appearance: She is not ill-appearing.  HENT:     Head: Normocephalic.     Comments: Nasal packing in place, dried red blood across packing Eyes:     General:        Right eye: No discharge.        Left eye: No discharge.  Cardiovascular:     Rate and Rhythm: Normal rate.     Heart sounds: No murmur (3/6 systolic murmur.  Could be from her right upper AV fistula) heard.    Comments: R AVF with palpable thrill and audible murmur Pulmonary:     Effort: Pulmonary effort is normal.     Breath sounds: Examination of the right-middle field reveals decreased breath sounds. Examination of the left-middle field reveals decreased breath sounds. Examination of the right-lower field reveals decreased breath sounds. Examination of the left-lower field reveals decreased breath sounds. Decreased breath sounds present. No wheezing, rhonchi or rales.  Abdominal:     General: Bowel sounds are normal.     Palpations: Abdomen is soft.  Musculoskeletal:     Right lower leg: No edema.     Left lower leg: No edema.     Comments: Palpable thrill of right AV fistula  Skin:    General: Skin is warm and dry.   Neurological:     Mental Status: She is alert. Mental status is at baseline.  Psychiatric:        Mood and Affect: Mood normal.     Filed Weights   02/23/23 0505 02/24/23 0617 02/25/23 0620  Weight: 98.8 kg 93.6 kg 91.1 kg     Intake/Output Summary (Last 24 hours) at 02/25/2023 1303 Last data filed at 02/25/2023 1200 Gross per 24 hour  Intake 590 ml  Output 3250 ml  Net -2660 ml    Net IO Since Admission: -15,129 mL [02/25/23 1303]  Pertinent Labs:    Latest Ref Rng & Units 02/24/2023   12:12 AM 02/23/2023   12:33 AM 02/22/2023    9:46 AM  CBC  WBC 4.0 - 10.5 K/uL 4.9  6.0  8.0   Hemoglobin 12.0 - 15.0 g/dL 10.3  9.7  9.4   Hematocrit 36.0 - 46.0 % 29.6  28.7  28.2   Platelets 150 - 400 K/uL 194  205  207        Latest Ref Rng & Units 02/25/2023   12:28 AM 02/24/2023   12:12 AM 02/23/2023   12:33 AM  CMP  Glucose 70 - 99 mg/dL 252  204  148   BUN 6 - 20 mg/dL 104  90  88   Creatinine 0.44 - 1.00 mg/dL 4.77  4.74  5.21   Sodium 135 - 145 mmol/L 133  134  133   Potassium 3.5 - 5.1 mmol/L 4.1  4.6  4.9   Chloride 98 - 111 mmol/L 91  98  104   CO2 22 - 32 mmol/L 25  22  18    Calcium 8.9 - 10.3 mg/dL 9.1  8.8  8.4     Imaging: No results found.  Assessment/Plan:   Principal Problem:   Hypervolemia associated with renal insufficiency Active Problems:   Diabetes mellitus with hyperglycemia (HCC)   HTN (hypertension)   Legally blind   Acute renal failure superimposed on stage 5 chronic kidney disease, not on chronic dialysis (HCC)   Anemia of chronic renal failure, stage 5 (HCC)   Hyperlipidemia associated with type 2 diabetes mellitus (HCC)   Ductal carcinoma in situ (DCIS) of left breast   Metabolic acidosis   Hyperkalemia   Sinus bradycardia   Acute renal failure superimposed on stage 5 chronic kidney disease, not on chronic dialysis, unspecified acute renal failure type ([place])   Acute pulmonary edema (HCC)   Hypoxia   Acute renal failure  ([place])   Patient Summary: Sarah Kline is a 57 y.o. living with visual blindness, T2DM, HTN, HLD, hx of DCIS of L breast s/p lumpectomy (2023; currently on tamoxifen), CKD V, and recent septoplasty and bilateral partial inferior turbinate resection on 02/20/2023, presenting with [facility], admitted for worsening renal function and progression towards ESRD.  Acute hypoxic respiratory failure 2/2 HFpEF Patient endorse no sob, says she has been ambulating and is going well.Breathing improving with oxygen sats of 95-99% with no [place] in place.No crackles on lung exam, continues to have bibasilar diminished breath sounds.  Had 2.7L output  over the past 24 hours with weight from 93.6kg to 91.1kg today. Overall improving. Nephrology transitioning from IV lasix to oral lasix with possible plan to discharge tomorrow. Will need to ensure she can ambulate without desaturating. -Start PO Lasix and discontinue Metolazone per nephrology -ambulatory pulse ox prior to d/c   AKI CKD 5 HyperkalemiaMetabolic acidosis with mildly elevated AG Creatinine stable 4.74 to 4.77. I do think that this is a plateau and likely represents a new baseline renal function.Good urine output on IV lasix and metolazone, will observe output on only oral lasix.Potassium stable at 4.1 off of lokelma. Bicarb stable at 25 on sodium bicarb. No need for dialysis at this point, patient is urinating well and electrolytes and acid/base status stable. Does have mature AVF. -Sodium Bicarb 650 mg BID -nephrology following -daily renal function panel  Recent nasal septoplastyepistaxis Nasal splints removed 3/35 by Dr Leta Baptist with ENT. Bleeding is common after septoplasty. No futher ENT needs, ENT signed off.  -nasal drip pad   Asymptomatic sinus bradycardia - improving Likely due to acute hyperkalemia.  Heart rate remains in the 70s and patient is without any symptoms. -telemetry -holding coreg    Anemia of chronic renal disease Her  hemoglobin stable with baseline around 9-10.  Ferritin is inappropriately normal at 79, which suggests a component of iron deficiency anemia.  Will hold off on IV iron in the setting of volume overload.   T2DM with hyperglycemia A1c 7.3% on 02/17/2023.  -sensitive SSI (renal)   HTN HLD -Holding Coreg and losartan in the setting of bradycardia  and AKI. BP with systolics to [id number] primarily, continue holding amlodipine. -continue home lipitor   DCIS L breast s/p lumpectomy (2023) She follows Dr. Lindi Adie with oncology for this. Last visit 07/2022. She underwent lumpectomy and adjuvant radiation therapy (finished 07/30/2022). She is currently on antiestrogen therapy with tamoxifen which Dr. Lindi Adie recommended to continue to for 5 years. -continue home tamoxifen 10mg  daily  Diet: Renal IVF: None,None VTE: Heparin Code: Full PT/OT recs: None, none. TOC recs:   Dispo: Anticipated discharge to Home in 2 days pending improvement of kidney function.   Iona Coach, MD 02/25/2023, 1:03 PM Pager: [phone]  Please contact the on call pager after 5 pm and on weekends at [phone].

## 2023-02-26 DIAGNOSIS — E872 Acidosis, unspecified: Secondary | ICD-10-CM

## 2023-02-26 DIAGNOSIS — N185 Chronic kidney disease, stage 5: Secondary | ICD-10-CM | POA: Diagnosis not present

## 2023-02-26 DIAGNOSIS — J9601 Acute respiratory failure with hypoxia: Secondary | ICD-10-CM | POA: Diagnosis not present

## 2023-02-26 DIAGNOSIS — E875 Hyperkalemia: Secondary | ICD-10-CM

## 2023-02-26 DIAGNOSIS — N179 Acute kidney failure, unspecified: Secondary | ICD-10-CM | POA: Diagnosis not present

## 2023-02-26 DIAGNOSIS — I5030 Unspecified diastolic (congestive) heart failure: Secondary | ICD-10-CM | POA: Diagnosis not present

## 2023-02-26 DIAGNOSIS — E8779 Other fluid overload: Secondary | ICD-10-CM

## 2023-02-26 LAB — RENAL FUNCTION PANEL
Albumin: 3.4 g/dL — ABNORMAL LOW (ref 3.5–5.0)
Anion gap: 16 — ABNORMAL HIGH (ref 5–15)
BUN: 110 mg/dL — ABNORMAL HIGH (ref 6–20)
CO2: 25 mmol/L (ref 22–32)
Calcium: 9 mg/dL (ref 8.9–10.3)
Chloride: 91 mmol/L — ABNORMAL LOW (ref 98–111)
Creatinine, Ser: 5.16 mg/dL — ABNORMAL HIGH (ref 0.44–1.00)
GFR, Estimated: 9 mL/min — ABNORMAL LOW (ref >60)
Glucose, Bld: 190 mg/dL — ABNORMAL HIGH (ref 70–99)
Phosphorus: 5.7 mg/dL — ABNORMAL HIGH (ref 2.5–4.6)
Potassium: 4 mmol/L (ref 3.5–5.1)
Sodium: 132 mmol/L — ABNORMAL LOW (ref 135–145)

## 2023-02-26 LAB — GLUCOSE, CAPILLARY
Glucose-Capillary: 158 mg/dL — ABNORMAL HIGH (ref 70–99)
Glucose-Capillary: 194 mg/dL — ABNORMAL HIGH (ref 70–99)
Glucose-Capillary: 211 mg/dL — ABNORMAL HIGH (ref 70–99)
Glucose-Capillary: 190 mg/dL — ABNORMAL HIGH (ref 70–99)

## 2023-02-26 MED ORDER — FUROSEMIDE 40 MG PO TABS: 80.0000 mg | ORAL_TABLET | Freq: Every day | ORAL | Status: DC

## 2023-02-26 MED ORDER — LACTATED RINGERS IV SOLN: INTRAVENOUS | Status: AC

## 2023-02-26 MED ORDER — FUROSEMIDE 40 MG PO TABS: 40.0000 mg | ORAL_TABLET | Freq: Every day | ORAL | Status: DC

## 2023-02-26 NOTE — Progress Notes (Signed)
Mobility Specialist: Progress Note   02/26/23 1414  Mobility  Activity Ambulated with assistance in hallway  Level of Assistance Contact guard assist, steadying assist  Assistive Device Other (Comment) (HHA)  Distance Ambulated (ft) 400 ft  Activity Response Tolerated well  Mobility Referral Yes  $Mobility charge 1 Mobility   Pre-Mobility: 77 HR, 90% SpO2 During Mobility: 90% SpO2 Post-Mobility: 75 HR, 93% SpO2  Pt received in the bed and agreeable to mobility. Mod I with bed mobility and contact guard during ambulation for balance. No c/o throughout. Pt's sats maintained >90% throughout. Pt sitting EOB after session with call bell and phone in reach.   Sarah Kline Mobility Specialist Please contact via SecureChat or Rehab office at [phone]

## 2023-02-26 NOTE — Plan of Care (Signed)

## 2023-02-26 NOTE — Progress Notes (Signed)
Patient ID: Sarah Kline, female   DOB: June 23, 1966, 57 y.o.   MRN: [id number] [facility] Progress Note   Assessment/ Plan:   1. Acute kidney Injury on chronic kidney disease stage V: Baseline creatinine usually ranges 3.6-3.9 when she presented with what appears to be hemodynamically mediated acute kidney injury in the setting of CHF exacerbation.  Urine output decreased as expected with decreased dosing of diuretics.  Unfortunately with some worsening of renal function/azotemia overnight which likely reflects that she is intravascularly contracted.  I will hold diuretics today and give her 0.5 L LR over 5 hours for gentle intravenous volume expansion.  Restart furosemide 40 mg daily by mouth starting tomorrow; this will be her discharge dose (she was not on diuretics prior to admission). 2.  Acute exacerbation of congestive heart failure: Significant diuresis noted overnight with symptomatic clinical improvement.  Furosemide switched to oral dosing yesterday evening and will be held today to restart tomorrow. 3.  Epistaxis: Status post recent septoplasty/bilateral turbinate reduction.  Seen yesterday by ENT surgery with plans for outpatient follow-up. 4.  Hyponatremia: Likely secondary to free water excretion defect in the setting of acute kidney injury/CHF exacerbation.  Plausibly worsened by metolazone/intravascular volume contraction and ADH activation.  Subjective:   Other than for nasal/perinasal discomfort, denies any complaints.  Specifically denies any chest pain or shortness of breath.   Objective:   BP 131/70 (BP Location: Left Arm)   Pulse 69   Temp 98.2 F (36.8 C)   Resp 17   Ht 5\' 5"  (1.651 m)   Wt 91.1 kg   LMP 04/28/2018   SpO2 97%   BMI 33.42 kg/m   Intake/Output Summary (Last 24 hours) at 02/26/2023 0753 Last data filed at 02/26/2023 0600 Gross per 24 hour  Intake 600 ml  Output 1500 ml  Net -900 ml   Weight change:   Physical Exam: Gen:  Comfortably resting in bed, awakens to conversation, on oxygen via nasal cannula CVS: Pulse regular rhythm, normal rate, S1 and S2 normal Resp: Clear to auscultation bilaterally without distinct rales or rhonchi Abd: Soft, flat, nontender, bowel sounds normal  Ext: No palpable lower extremity edema, right brachiocephalic fistula with palpable thrill  Imaging: No results found.  Labs: BMET Recent Labs  Lab 02/20/23 1125 02/21/23 0852 02/21/23 1402 02/21/23 1417 02/22/23 0724 02/23/23 0033 02/24/23 0012 02/25/23 0028 02/26/23 0015  NA 142 135  --  134* 134* 133* 134* 133* 132*  K 5.2* 6.5* 5.9* 6.0* 5.7* 4.9 4.6 4.1 4.0  CL 113* 105  --   --  104 104 98 91* 91*  CO2  --  17*  --   --  17* 18* 22 25 25   GLUCOSE 166* 245*  --   --  123* 148* 204* 252* 190*  BUN 49* 69*  --   --  80* 88* 90* 104* 110*  CREATININE 4.10* 4.76*  --   --  5.33* 5.21* 4.74* 4.77* 5.16*  CALCIUM  --  9.0  --   --  8.6* 8.4* 8.8* 9.1 9.0  PHOS  --   --  7.4*  --  6.4*  --  5.7* 5.3* 5.7*   CBC Recent Labs  Lab 02/21/23 0852 02/21/23 1417 02/22/23 0946 02/23/23 0033 02/24/23 0012  WBC 8.0  --  8.0 6.0 4.9  NEUTROABS 7.2  --   --   --   --   HGB 9.7* 10.5* 9.4* 9.7* 10.3*  HCT 31.2* 31.0* 28.2*  28.7* 29.6*  MCV 96.6  --  88.7 89.4 87.1  PLT 195  --  207 205 194    Medications:     amLODipine  5 mg Oral Daily   atorvastatin  40 mg Oral QHS   calcitRIOL  0.25 mcg Oral QODAY   [START ON 02/27/2023] furosemide  80 mg Oral Daily   heparin  5,000 Units Subcutaneous Q8H   insulin aspart  0-9 Units Subcutaneous TID WC   polyethylene glycol  17 g Oral Daily   sodium bicarbonate  650 mg Oral BID   tamoxifen  10 mg Oral Daily   Elmarie Shiley, MD 02/26/2023, 7:53 AM

## 2023-02-26 NOTE — Progress Notes (Signed)
HD#5 Subjective:  Overnight Events: no event  Evaluated at the bedside this AM. Sometimes she urinates in the toilet so not having all urine collected. Feeling good. Breathing is okay. She is not short of breath. Thinking it is more from the nose. She was up and walking yesterday. Eating okay. Using bathroom okay. Unsure if she saw the kidney docs. Explained plan to patient and answered all ger questions.   Objective:  Vital signs in last 24 hours: Vitals:   02/25/23 2306 02/26/23 0201 02/26/23 0750 02/26/23 1048  BP: 115/64 137/68 131/70 129/62  Pulse: 64 69 69 76  Resp: 16 16 17 17   Temp: 98.4 F (36.9 C) 98.5 F (36.9 C) 98.2 F (36.8 C) 98.2 F (36.8 C)  TempSrc: Oral Oral  Oral  SpO2: 100% 98% 97% 96%  Weight:   90.9 kg   Height:   5\' 5"  (1.651 m)    Supplemental O2: cpap SpO2: 96 % O2 Flow Rate (L/min): 3 L/min   Physical Exam:  Physical Exam Constitutional:      General: She is not in acute distress.    Appearance: She is not ill-appearing.  HENT:     Head: Normocephalic.     Comments: Nasal packing in place, dried red blood across packing Eyes:     General:        Right eye: No discharge.        Left eye: No discharge.  Cardiovascular:     Rate and Rhythm: Normal rate.     Heart sounds: No murmur (3/6 systolic murmur.  Could be from her right upper AV fistula) heard.    Comments: R AVF with palpable thrill and audible murmur Pulmonary:     Effort: Pulmonary effort is normal.     Breath sounds: Examination of the right-middle field reveals decreased breath sounds. Examination of the left-middle field reveals decreased breath sounds. Examination of the right-lower field reveals decreased breath sounds. Examination of the left-lower field reveals decreased breath sounds. Decreased breath sounds present. No wheezing, rhonchi or rales.  Abdominal:     General: Bowel sounds are normal.     Palpations: Abdomen is soft.  Musculoskeletal:     Right lower leg:  No edema.     Left lower leg: No edema.     Comments: Palpable thrill of right AV fistula  Skin:    General: Skin is warm and dry.  Neurological:     Mental Status: She is alert. Mental status is at baseline.  Psychiatric:        Mood and Affect: Mood normal.     Filed Weights   02/24/23 0617 02/25/23 0620 02/26/23 0750  Weight: 93.6 kg 91.1 kg 90.9 kg     Intake/Output Summary (Last 24 hours) at 02/26/2023 1433 Last data filed at 02/26/2023 [id number] Gross per 24 hour  Intake 720 ml  Output 950 ml  Net -230 ml    Net IO Since Admission: -15,359 mL [02/26/23 1433]  Pertinent Labs:    Latest Ref Rng & Units 02/24/2023   12:12 AM 02/23/2023   12:33 AM 02/22/2023    9:46 AM  CBC  WBC 4.0 - 10.5 K/uL 4.9  6.0  8.0   Hemoglobin 12.0 - 15.0 g/dL 10.3  9.7  9.4   Hematocrit 36.0 - 46.0 % 29.6  28.7  28.2   Platelets 150 - 400 K/uL 194  205  207        Latest Ref Rng &  Units 02/26/2023   12:15 AM 02/25/2023   12:28 AM 02/24/2023   12:12 AM  CMP  Glucose 70 - 99 mg/dL 190  252  204   BUN 6 - 20 mg/dL 110  104  90   Creatinine 0.44 - 1.00 mg/dL 5.16  4.77  4.74   Sodium 135 - 145 mmol/L 132  133  134   Potassium 3.5 - 5.1 mmol/L 4.0  4.1  4.6   Chloride 98 - 111 mmol/L 91  91  98   CO2 22 - 32 mmol/L 25  25  22    Calcium 8.9 - 10.3 mg/dL 9.0  9.1  8.8     Imaging: No results found.  Assessment/Plan:   Principal Problem:   Hypervolemia associated with renal insufficiency Active Problems:   Diabetes mellitus with hyperglycemia (HCC)   HTN (hypertension)   Legally blind   Acute renal failure superimposed on stage 5 chronic kidney disease, not on chronic dialysis (HCC)   Anemia of chronic renal failure, stage 5 (HCC)   Hyperlipidemia associated with type 2 diabetes mellitus (HCC)   Ductal carcinoma in situ (DCIS) of left breast   Metabolic acidosis   Hyperkalemia   Sinus bradycardia   Acute renal failure superimposed on stage 5 chronic kidney disease, not on chronic  dialysis, unspecified acute renal failure type ([place])   Acute pulmonary edema (HCC)   Hypoxia   Acute renal failure ([place])   Patient Summary: Sarah Kline is a 57 y.o. living with visual blindness, T2DM, HTN, HLD, hx of DCIS of L breast s/p lumpectomy (2023; currently on tamoxifen), CKD V, and recent septoplasty and bilateral partial inferior turbinate resection on 02/20/2023, presenting with [facility], admitted for worsening renal function and progression towards ESRD.  Acute hypoxic respiratory failure 2/2 HFpEF Patient endorse no sob, says she has been ambulating and is going well.Breathing stable with oxygen sats of 95-99% on RA, sats >90% with ambulation.Euvolemic on exam. Had 1.5L output  over the past 24 hours with transition to PO lasix but does say she urinated many times in the toiled. Discussed urinating in collection container to ensure appropriate urine output for discharge planning. Overall improving. Likely to discharge tomorrow -holding diuresis -ambulatory pulse ox prior to d/c   AKI CKD 5 HyperkalemiaMetabolic acidosis with mildly elevated AG Creatinine stable from 4.77 to 5.16 in the setting of over diuresis. Got 0.5L IVF per nephrology today, will reevaluate tomorrow and potentially plan for discharge on lasix 40 daily. Holding diuresis today. Bicarb stable at 25 on sodium bicarb. Will continue to observe urine output as discharge nears. -Sodium Bicarb 650 mg BID -nephrology following -daily renal function panel  Recent nasal septoplastyepistaxis Nasal splints removed 3/35 by Dr Leta Baptist with ENT. Bleeding is common after septoplasty. No futher ENT needs, ENT signed off.  -nasal drip pad   Asymptomatic sinus bradycardia - improving Likely due to acute hyperkalemia.  Heart rate remains in the 70s and patient is without any symptoms. -telemetry -holding coreg    Anemia of chronic renal disease Her hemoglobin stable with baseline around 9-10.  Ferritin is  inappropriately normal at 79, which suggests a component of iron deficiency anemia.  Will hold off on IV iron in the setting of volume overload.   T2DM with hyperglycemia A1c 7.3% on 02/17/2023.  -sensitive SSI (renal)   HTN HLD -Holding Coreg and losartan in the setting of bradycardia and AKI. BP with systolics to [id number] primarily, continue holding amlodipine. -continue home lipitor  DCIS L breast s/p lumpectomy (2023) She follows Dr. Lindi Adie with oncology for this. Last visit 07/2022. She underwent lumpectomy and adjuvant radiation therapy (finished 07/30/2022). She is currently on antiestrogen therapy with tamoxifen which Dr. Lindi Adie recommended to continue to for 5 years. -continue home tamoxifen 10mg  daily  Diet: Renal IVF: None,None VTE: Heparin Code: Full PT/OT recs: None, none. TOC recs:   Dispo: Anticipated discharge to Home in 1 days pending improvement of kidney function.   Iona Coach, MD 02/26/2023, 2:33 PM Pager: [phone]  Please contact the on call pager after 5 pm and on weekends at [phone].

## 2023-02-26 NOTE — Care Management Important Message (Signed)
Important Message  Patient Details  Name: Sarah Kline MRN: [id number] Date of Birth: 1966/06/09   Medicare Important Message Given:  Yes     Orbie Pyo 02/26/2023, 2:30 PM

## 2023-02-26 NOTE — Progress Notes (Signed)
Nurse requested Mobility Specialist to perform oxygen saturation test with pt which includes removing pt from oxygen both at rest and while ambulating.  Below are the results from that testing.     Patient Saturations on Room Air at Rest = spO2 90%  Patient Saturations on Room Air while Ambulating = sp02 90% .  Rested and performed pursed lip breathing for 1 minute with sp02 at 91%.  Patient Saturations on -- Liters of oxygen while Ambulating = sp02 90%  Reported results to nurse.

## 2023-02-27 DIAGNOSIS — N179 Acute kidney failure, unspecified: Secondary | ICD-10-CM | POA: Diagnosis not present

## 2023-02-27 DIAGNOSIS — J9601 Acute respiratory failure with hypoxia: Secondary | ICD-10-CM | POA: Diagnosis not present

## 2023-02-27 DIAGNOSIS — N185 Chronic kidney disease, stage 5: Secondary | ICD-10-CM | POA: Diagnosis not present

## 2023-02-27 DIAGNOSIS — I5030 Unspecified diastolic (congestive) heart failure: Secondary | ICD-10-CM | POA: Diagnosis not present

## 2023-02-27 LAB — RENAL FUNCTION PANEL
Albumin: 3.5 g/dL (ref 3.5–5.0)
Albumin: 3.4 g/dL — ABNORMAL LOW (ref 3.5–5.0)
Albumin: 3.1 g/dL — ABNORMAL LOW (ref 3.5–5.0)
Anion gap: 15 (ref 5–15)
Anion gap: 16 — ABNORMAL HIGH (ref 5–15)
Anion gap: 15 (ref 5–15)
BUN: 117 mg/dL — ABNORMAL HIGH (ref 6–20)
BUN: 118 mg/dL — ABNORMAL HIGH (ref 6–20)
BUN: 116 mg/dL — ABNORMAL HIGH (ref 6–20)
CO2: 24 mmol/L (ref 22–32)
CO2: 26 mmol/L (ref 22–32)
CO2: 25 mmol/L (ref 22–32)
Calcium: 8.9 mg/dL (ref 8.9–10.3)
Calcium: 9.2 mg/dL (ref 8.9–10.3)
Calcium: 8.6 mg/dL — ABNORMAL LOW (ref 8.9–10.3)
Chloride: 91 mmol/L — ABNORMAL LOW (ref 98–111)
Chloride: 91 mmol/L — ABNORMAL LOW (ref 98–111)
Chloride: 90 mmol/L — ABNORMAL LOW (ref 98–111)
Creatinine, Ser: 5.44 mg/dL — ABNORMAL HIGH (ref 0.44–1.00)
Creatinine, Ser: 5.62 mg/dL — ABNORMAL HIGH (ref 0.44–1.00)
Creatinine, Ser: 5.38 mg/dL — ABNORMAL HIGH (ref 0.44–1.00)
GFR, Estimated: 9 mL/min — ABNORMAL LOW (ref >60)
GFR, Estimated: 8 mL/min — ABNORMAL LOW (ref >60)
GFR, Estimated: 9 mL/min — ABNORMAL LOW (ref >60)
Glucose, Bld: 179 mg/dL — ABNORMAL HIGH (ref 70–99)
Glucose, Bld: 146 mg/dL — ABNORMAL HIGH (ref 70–99)
Glucose, Bld: 252 mg/dL — ABNORMAL HIGH (ref 70–99)
Phosphorus: 5.4 mg/dL — ABNORMAL HIGH (ref 2.5–4.6)
Phosphorus: 6.3 mg/dL — ABNORMAL HIGH (ref 2.5–4.6)
Phosphorus: 5.6 mg/dL — ABNORMAL HIGH (ref 2.5–4.6)
Potassium: 4.2 mmol/L (ref 3.5–5.1)
Potassium: 4.1 mmol/L (ref 3.5–5.1)
Potassium: 3.9 mmol/L (ref 3.5–5.1)
Sodium: 130 mmol/L — ABNORMAL LOW (ref 135–145)
Sodium: 133 mmol/L — ABNORMAL LOW (ref 135–145)
Sodium: 130 mmol/L — ABNORMAL LOW (ref 135–145)

## 2023-02-27 LAB — GLUCOSE, CAPILLARY
Glucose-Capillary: 150 mg/dL — ABNORMAL HIGH (ref 70–99)
Glucose-Capillary: 169 mg/dL — ABNORMAL HIGH (ref 70–99)

## 2023-02-27 MED ORDER — LACTATED RINGERS IV SOLN: INTRAVENOUS | Status: AC

## 2023-02-27 MED ORDER — FUROSEMIDE 40 MG PO TABS: 40.0000 mg | ORAL_TABLET | Freq: Every day | ORAL | 0 refills | Status: DC

## 2023-02-27 MED ORDER — AMLODIPINE BESYLATE 5 MG PO TABS: 5.0000 mg | ORAL_TABLET | Freq: Every day | ORAL | 0 refills | Status: DC

## 2023-02-27 MED ORDER — POLYSACCHARIDE IRON COMPLEX 150 MG PO CAPS: 150.0000 mg | ORAL_CAPSULE | Freq: Every day | ORAL | 0 refills | Status: DC

## 2023-02-27 MED ORDER — FUROSEMIDE 40 MG PO TABS: 40.0000 mg | ORAL_TABLET | Freq: Every day | ORAL | Status: DC

## 2023-02-27 NOTE — Progress Notes (Signed)
Patient ID: Sarah Kline, female   DOB: 21-May-1966, 57 y.o.   MRN: [id number] [facility] Progress Note   Assessment/ Plan:   1. Acute kidney Injury on chronic kidney disease stage V: Baseline creatinine usually ranges 3.6-3.9 when she presented with what appears to be hemodynamically mediated acute kidney injury in the setting of CHF exacerbation.  Urine output decreased as expected with decreased dosing of diuretics.  Gentle fluid bolus given yesterday (500 cc LR) while holding diuretics with no clear improvement of renal function overnight; continue to hold diuretics and give an additional 0.5 L LR this morning.  Would recommend repeating labs this afternoon around 1500 and if renal function stable/better, she should be safe to discharge home.  I will confirm that she has scheduled renal follow-up when the office is open on Monday 4/1. 2.  Acute exacerbation of congestive heart failure: Significant diuresis noted overnight with symptomatic clinical improvement.  Continue to hold furosemide today with the intention of restarting this at a lower dose of 40 mg daily tomorrow. 3.  Epistaxis: Status post recent septoplasty/bilateral turbinate reduction.  Seen yesterday by ENT surgery with plans for outpatient follow-up. 4.  Hyponatremia: Likely secondary to free water excretion defect in the setting of acute kidney injury/CHF exacerbation.  Slight improvement on labs noted this morning.  Subjective:   Intermittent discomfort of nasal/perinasal area otherwise comfortable.  Denies any chest pain or shortness of breath.  Inquires about going home.   Objective:   BP (!) 144/68 (BP Location: Left Arm)   Pulse 71   Temp 99.8 F (37.7 C) (Oral)   Resp 19   Ht 5\' 5"  (1.651 m)   Wt 91.7 kg   LMP 04/28/2018   SpO2 94%   BMI 33.64 kg/m   Intake/Output Summary (Last 24 hours) at 02/27/2023 [id number] Last data filed at 02/27/2023 0700 Gross per 24 hour  Intake 1461.49 ml  Output 550 ml   Net 911.49 ml   Weight change:   Physical Exam: Gen: Comfortably resting in bed on oxygen via nasal cannula, awake/alert CVS: Pulse regular rhythm, normal rate, S1 and S2 normal Resp: Clear to auscultation bilaterally without distinct rales or rhonchi Abd: Soft, flat, nontender, bowel sounds normal  Ext: No palpable lower extremity edema, right brachiocephalic fistula with palpable thrill  Imaging: No results found.  Labs: BMET Recent Labs  Lab 02/21/23 1402 02/21/23 1417 02/22/23 [id number] 02/23/23 0033 02/24/23 0012 02/25/23 0028 02/26/23 0015 02/26/23 2326 02/27/23 0713  NA  --    < > 134* 133* 134* 133* 132* 130* 133*  K 5.9*   < > 5.7* 4.9 4.6 4.1 4.0 4.2 4.1  CL  --   --  104 104 98 91* 91* 91* 91*  CO2  --   --  17* 18* 22 25 25 24 26   GLUCOSE  --   --  123* 148* 204* 252* 190* 179* 146*  BUN  --   --  80* 88* 90* 104* 110* 117* 118*  CREATININE  --   --  5.33* 5.21* 4.74* 4.77* 5.16* 5.44* 5.62*  CALCIUM  --   --  8.6* 8.4* 8.8* 9.1 9.0 8.9 9.2  PHOS 7.4*  --  6.4*  --  5.7* 5.3* 5.7* 5.4* 6.3*   < > = values in this interval not displayed.   CBC Recent Labs  Lab 02/21/23 0852 02/21/23 1417 02/22/23 0946 02/23/23 0033 02/24/23 0012  WBC 8.0  --  8.0 6.0 4.9  NEUTROABS 7.2  --   --   --   --   HGB 9.7* 10.5* 9.4* 9.7* 10.3*  HCT 31.2* 31.0* 28.2* 28.7* 29.6*  MCV 96.6  --  88.7 89.4 87.1  PLT 195  --  207 205 194    Medications:     amLODipine  5 mg Oral Daily   atorvastatin  40 mg Oral QHS   calcitRIOL  0.25 mcg Oral QODAY   [START ON 02/28/2023] furosemide  40 mg Oral Daily   heparin  5,000 Units Subcutaneous Q8H   insulin aspart  0-9 Units Subcutaneous TID WC   polyethylene glycol  17 g Oral Daily   sodium bicarbonate  650 mg Oral BID   tamoxifen  10 mg Oral Daily   Elmarie Shiley, MD 02/27/2023, 8:12 AM

## 2023-02-27 NOTE — Discharge Summary (Signed)
Name: Sarah Kline MRN: [id number] DOB: 1966-02-04 57 y.o. PCP: Cipriano Mile, NP  Date of Admission: 02/21/2023  7:57 AM Date of Discharge: 02/27/2023 2:45PM Attending Physician: Lottie Mussel, MD  Discharge Diagnosis: 1. Principal Problem:   Hypervolemia associated with renal insufficiency Active Problems:   Diabetes mellitus with hyperglycemia (HCC)   HTN (hypertension)   Legally blind   Acute renal failure superimposed on stage 5 chronic kidney disease, not on chronic dialysis (HCC)   Anemia of chronic renal failure, stage 5 (HCC)   Hyperlipidemia associated with type 2 diabetes mellitus (HCC)   Ductal carcinoma in situ (DCIS) of left breast   Metabolic acidosis   Hyperkalemia   Sinus bradycardia   Acute renal failure superimposed on stage 5 chronic kidney disease, not on chronic dialysis, unspecified acute renal failure type ([facility])   Acute pulmonary edema (HCC)   Hypoxia   Acute renal failure ([facility])    Discharge Medications: Allergies as of 02/27/2023   No Known Allergies      Medication List     STOP taking these medications    amoxicillin 875 MG tablet Commonly known as: AMOXIL   carvedilol 25 MG tablet Commonly known as: COREG   losartan 50 MG tablet Commonly known as: COZAAR   oxyCODONE-acetaminophen 5-325 MG tablet Commonly known as: Percocet       TAKE these medications    amLODipine 5 MG tablet Commonly known as: NORVASC Take 1 tablet (5 mg total) by mouth daily. Start taking on: February 28, 2023 What changed:  medication strength how much to take when to take this   atorvastatin 40 MG tablet Commonly known as: LIPITOR Take 40 mg by mouth at bedtime.   calcitRIOL 0.25 MCG capsule Commonly known as: ROCALTROL TAKE 1 CAPSULE EVERY DAY What changed: when to take this   furosemide 40 MG tablet Commonly known as: LASIX Take 1 tablet (40 mg total) by mouth daily. Start taking on: March 02, 2023   iron polysaccharides 150 MG  capsule Commonly known as: NIFEREX Take 1 capsule (150 mg total) by mouth daily.   Prodigy Autocode Blood Glucose w/Device Kit 1 kit by Does not apply route as directed.   sodium bicarbonate 650 MG tablet Take 1,300 mg by mouth 2 (two) times daily.   tamoxifen 10 MG tablet Commonly known as: NOLVADEX Take 1 tablet (10 mg total) by mouth daily.        Disposition and follow-up:   Sarah Kline was discharged from [facility] in Good condition.  At the hospital follow up visit please address:  1.    CKD V -repeat renal function panel -ensure adequate urine output, titrate lasix as needed -nephrology follow up -hold lisinopril -continue every day iron supplementation  HTN HFpEF -restart carvedilol as needed  2.  Labs / imaging needed at time of follow-up: cbc,renal function panel  3.  Pending labs/ test needing follow-up: NA  Follow-up Appointments:  Follow-up Information     Cipriano Mile, NP Follow up.   Contact information: [place] [address] [facility] Course by problem list: Sarah Kline is a 57 y.o. living with visual blindness, T2DM, HTN, HLD, hx of DCIS of L breast s/p lumpectomy (2023; currently on tamoxifen), CKD V, and recent septoplasty and bilateral partial inferior turbinate resection on 02/20/2023, presenting with [facility], admitted for worsening renal function and  progression towards ESRD.   Acute hypoxic respiratory failure 2/2 HFpEF Presented after a septoplasty with SOB. She was found to have worsening renal function and pulmonary edema.BNP elevated to 1.1K. Echo with LVEF 60-65%, mild LAE,grade II DD.On exam, she had increased WOB and crackles at lung bases, no wheezing, no peripheral edema. Patient was heavily diuresed during admission. At discharge had good oxygen saturation on RA, euvolemic on exam. Weight at discharge was 91.7KG. To start lasix 40mg  PO Monday 4/1  and continue daily per nephrology.    AKI CKD 5 HyperkalemiaMetabolic acidosis with mildly elevated AG Patient with known advanced CKD (stage V). Her baseline Cr ~3.7-3.9 and GFR ~13. On admission, Cr 4.76 and GFR 10. Suspected decline in the setting of cardiorenal syndrome from HFpEF exacerbation. Diuresed on IV lasix and metolazone per nephrology. Throughout admission patient with a net -14L fluid balance and from 103.8KG to 91.7 kg. Creatinine bumped during admission from 4.77 to 5.16 with a peak of 5.62 in the setting of over diuresis. Got 0.5L IVF x 2 days and diuretics were held during this time. Creatinine at discharge to  5.38 with eGFR 9.  Bicarb stable at 25 on sodium bicarb 650mg  BID during admission. Initially required lokelma but potassium stable off lokelma through remainder of admission. Has a mature and patent R AVF, no need for dialysis at this time given stable electrolytes, good urine output, stable acid/base status.    Recent nasal septoplastyepistaxis Completed amoxicillin course.Nasal splints removed 3/35 by Dr Leta Baptist with ENT. Bleeding is common after septoplasty. No futher ENT needs, ENT signed off. Nasal drip pads during admission.   Asymptomatic sinus bradycardia Thought to be due to acute hyperkalemia on admission to 5.5.  Heart rate in the 70s primarily at discharge. Monitored on tele, held coreg at discharge.   Anemia of chronic renal disease Her hemoglobin remained stable during admission with baseline around 9-10 and found to be 10.3 at discharge . Ferritin was inappropriately normal at 79, which suggests a component of iron deficiency anemia.  Held off on IV iron in the setting of volume overload, started on oral iron at discharge.   T2DM with hyperglycemia A1c 7.3% on 02/17/2023. Sensitive SSI (renal) during admission.   HTN HLD Held Coreg and losartan during admission and at discharge in the setting of bradycardia and AKI. BP with systolics to [id number]  primarily during admission after resuming amlodipine. Continued home lipitor.   DCIS L breast s/p lumpectomy (2023) She follows Dr. Lindi Adie with oncology for this. Last visit 07/2022. She underwent lumpectomy and adjuvant radiation therapy (finished 07/30/2022). She is currently on antiestrogen therapy with tamoxifen which Dr. Lindi Adie recommended to continue to for 5 years.Continued home tamoxifen 10mg  daily.  Subjective: Breathing is good. She is getting out of bed and it is going well. She reports she is peeing about 4-5 times a day. Not having too many bowel movements. The nephrology team stated that she will need labs this afternoon. Discharge Exam:   BP 113/61 (BP Location: Left Arm)   Pulse 73   Temp 98.6 F (37 C) (Oral)   Resp 20   Ht 5\' 5"  (1.651 m)   Wt 91.7 kg   LMP 04/28/2018   SpO2 92%   BMI 33.64 kg/m  Discharge exam:   Gen: well nourished, well developed, laying in bed in no acute distress Pulm: Normal wob on RA, decreased bibasilar breath sounds, LCTAB CV: regular rate and rhythm, holosystolic murmur heard throughout the precordium, normal  s1/s2, 2+ bilateral radial pulses, palpable thrill at R AV fistula Abdomen: normal active bowel sounds Extremities: warm, dry, no LE edema  Pertinent Labs, Studies, and Procedures:     Latest Ref Rng & Units 02/24/2023   12:12 AM 02/23/2023   12:33 AM 02/22/2023    9:46 AM  CBC  WBC 4.0 - 10.5 K/uL 4.9  6.0  8.0   Hemoglobin 12.0 - 15.0 g/dL 10.3  9.7  9.4   Hematocrit 36.0 - 46.0 % 29.6  28.7  28.2   Platelets 150 - 400 K/uL 194  205  207        Latest Ref Rng & Units 02/27/2023   12:35 PM 02/27/2023    7:13 AM 02/26/2023   11:26 PM  BMP  Glucose 70 - 99 mg/dL 252  146  179   BUN 6 - 20 mg/dL 116  118  117   Creatinine 0.44 - 1.00 mg/dL 5.38  5.62  5.44   Sodium 135 - 145 mmol/L 130  133  130   Potassium 3.5 - 5.1 mmol/L 3.9  4.1  4.2   Chloride 98 - 111 mmol/L 90  91  91   CO2 22 - 32 mmol/L 25  26  24    Calcium 8.9 -  10.3 mg/dL 8.6  9.2  8.9    DG CHEST PORT 1 VIEW  Result Date: 02/22/2023 CLINICAL DATA:  [id number] Follow-up exam [id number] EXAM: PORTABLE CHEST 1 VIEW COMPARISON:  February 21, 2023 FINDINGS: Evaluation is limited by patient positioning. The cardiomediastinal silhouette is unchanged in contour. Similar perihilar vascular congestion. Favor small LEFT pleural effusion with incomplete visualization of the LEFT costophrenic angle. No pneumothorax. Persistent interstitial prominence, favored minimally improved. IMPRESSION: 1. Favored minimally improved pulmonary edema with likely small LEFT pleural effusion. 2. Similar enlargement of the pulmonary arteries. Electronically Signed   By: Valentino Saxon M.D.   On: 02/22/2023 12:10   ECHOCARDIOGRAM COMPLETE  Result Date: 02/22/2023    ECHOCARDIOGRAM REPORT   Patient Name:   Sarah Kline Date of Exam: 02/22/2023 Medical Rec #:  [id number]          Height:       65.0 in Accession #:    [id number]         Weight:       228.8 lb Date of Birth:  1966-10-25         BSA:          2.095 m Patient Age:    35 years           BP:           145/60 mmHg Patient Gender: F                  HR:           64 bpm. Exam Location:  Inpatient Procedure: 2D Echo, Cardiac Doppler and Color Doppler Indications:    I27.2 Cardiomegaly; I27.2 Pulmonary hypertension  History:        Patient has prior history of Echocardiogram examinations, most                 recent 01/20/2017. Risk Factors:Diabetes, Hypertension,                 Dyslipidemia and Non-Smoker.  Sonographer:    Wilkie Aye RVT RCS Referring Phys: [id number] EMILY B MULLEN  Sonographer Comments: Suboptimal parasternal window. Image acquisition challenging due to patient body habitus. IMPRESSIONS  1.  Left ventricular ejection fraction, by estimation, is 60 to 65%. The left ventricle has normal function. The left ventricle has no regional wall motion abnormalities. There is moderate left ventricular hypertrophy. Left ventricular diastolic  parameters are consistent with Grade II diastolic dysfunction (pseudonormalization). Elevated left ventricular end-diastolic pressure.  2. Right ventricular systolic function is mildly reduced. The right ventricular size is mildly enlarged.  3. Left atrial size was mildly dilated.  4. The mitral valve is normal in structure. No evidence of mitral valve regurgitation. No evidence of mitral stenosis.  5. Tricuspid valve regurgitation is moderate to severe.  6. The aortic valve is tricuspid. There is mild calcification of the aortic valve. There is mild thickening of the aortic valve. Aortic valve regurgitation is trivial. Aortic valve sclerosis is present, with no evidence of aortic valve stenosis.  7. The inferior vena cava is normal in size with greater than 50% respiratory variability, suggesting right atrial pressure of 3 mmHg. FINDINGS  Left Ventricle: Left ventricular ejection fraction, by estimation, is 60 to 65%. The left ventricle has normal function. The left ventricle has no regional wall motion abnormalities. The left ventricular internal cavity size was normal in size. There is  moderate left ventricular hypertrophy. Left ventricular diastolic parameters are consistent with Grade II diastolic dysfunction (pseudonormalization). Elevated left ventricular end-diastolic pressure. Right Ventricle: The right ventricular size is mildly enlarged. No increase in right ventricular wall thickness. Right ventricular systolic function is mildly reduced. Left Atrium: Left atrial size was mildly dilated. Right Atrium: Right atrial size was normal in size. Pericardium: Trivial pericardial effusion is present. The pericardial effusion is posterior to the left ventricle. Mitral Valve: The mitral valve is normal in structure. No evidence of mitral valve regurgitation. No evidence of mitral valve stenosis. Tricuspid Valve: The tricuspid valve is normal in structure. Tricuspid valve regurgitation is moderate to severe. No  evidence of tricuspid stenosis. Aortic Valve: The aortic valve is tricuspid. There is mild calcification of the aortic valve. There is mild thickening of the aortic valve. Aortic valve regurgitation is trivial. Aortic valve sclerosis is present, with no evidence of aortic valve stenosis. Aortic valve mean gradient measures 10.8 mmHg. Aortic valve peak gradient measures 18.9 mmHg. Aortic valve area, by VTI measures 1.81 cm. Pulmonic Valve: The pulmonic valve was normal in structure. Pulmonic valve regurgitation is mild. No evidence of pulmonic stenosis. Aorta: The aortic root is normal in size and structure. Venous: The inferior vena cava is normal in size with greater than 50% respiratory variability, suggesting right atrial pressure of 3 mmHg. IAS/Shunts: No atrial level shunt detected by color flow Doppler.  LEFT VENTRICLE PLAX 2D LVIDd:         3.70 cm   Diastology LVIDs:         2.50 cm   LV e' medial:    4.88 cm/s LV PW:         1.50 cm   LV E/e' medial:  26.0 LV IVS:        1.50 cm   LV e' lateral:   5.84 cm/s LVOT diam:     1.70 cm   LV E/e' lateral: 21.7 LV SV:         81 LV SV Index:   39 LVOT Area:     2.27 cm  RIGHT VENTRICLE             IVC RV Basal diam:  4.10 cm     IVC diam: 1.90 cm RV S prime:  10.60 cm/s TAPSE (M-mode): 2.7 cm LEFT ATRIUM           Index        RIGHT ATRIUM           Index LA diam:      3.80 cm 1.81 cm/m   RA Area:     21.20 cm LA Vol (A4C): 48.2 ml 23.01 ml/m  RA Volume:   71.00 ml  33.90 ml/m  AORTIC VALVE                     PULMONIC VALVE AV Area (Vmax):    1.77 cm      PV Vmax:          1.16 m/s AV Area (Vmean):   1.72 cm      PV Peak grad:     5.4 mmHg AV Area (VTI):     1.81 cm      PR End Diast Vel: 7.18 msec AV Vmax:           217.50 cm/s AV Vmean:          149.500 cm/s AV VTI:            0.448 m AV Peak Grad:      18.9 mmHg AV Mean Grad:      10.8 mmHg LVOT Vmax:         170.00 cm/s LVOT Vmean:        113.000 cm/s LVOT VTI:          0.358 m LVOT/AV VTI ratio:  0.80  AORTA Ao Root diam: 2.80 cm Ao Asc diam:  3.60 cm Ao Arch diam: 3.0 cm MITRAL VALVE                TRICUSPID VALVE MV Area (PHT): 2.09 cm     TR Peak grad:   20.8 mmHg MV Decel Time: 363 msec     TR Vmax:        228.00 cm/s MV E velocity: 127.00 cm/s MV A velocity: 132.00 cm/s  SHUNTS MV E/A ratio:  0.96         Systemic VTI:  0.36 m                             Systemic Diam: 1.70 cm Jenkins Rouge MD Electronically signed by Jenkins Rouge MD Signature Date/Time: 02/22/2023/10:19:34 AM    Final    US RENAL  Result Date: 02/21/2023 CLINICAL DATA:  Acute on chronic renal failure EXAM: RENAL / URINARY TRACT ULTRASOUND COMPLETE COMPARISON:  01/29/2018 FINDINGS: Right Kidney: Renal measurements: 8.1 x 4.0 x 3.4 cm. = volume: 57 mL. No hydronephrosis is noted. Cortical thinning is seen as well as increased echogenicity consistent with the given clinical history. Left Kidney: Renal measurements: 6.7 x 4.0 x 3.4 cm. = volume: 47 mL. Increased echogenicity is noted. No hydronephrosis is seen. Bladder: Decompressed Other: None. IMPRESSION: Small kidneys with increased echogenicity consistent with medical renal disease. No obstructive changes are noted. Electronically Signed   By: Inez Catalina M.D.   On: 02/21/2023 20:45   DG Chest 2 View  Result Date: 02/21/2023 CLINICAL DATA:  Shortness of breath EXAM: CHEST - 2 VIEW COMPARISON:  05/07/2015 FINDINGS: Cardiomegaly and main pulmonary artery enlargement. The vascular hila also appear thickened. Interstitial opacity diffusely. No effusion or Kerley lines. No evidence of pneumonia or air leak. Extensive artifact from EKG leads. IMPRESSION: Cardiomegaly  and vascular congestion/interstitial edema. Prominent enlargement of the main pulmonary artery suggesting pulmonary hypertension. Electronically Signed   By: Jorje Guild M.D.   On: 02/21/2023 09:17     Discharge Instructions: Discharge Instructions     Diet - low sodium heart healthy   Complete by: As directed     Increase activity slowly   Complete by: As directed      You were hospitalized for heart failure exacerbation and acute kidney injury. You were treated with IV lasix. Your breathing improved with this and removal of your nasal stents. Nephrology followed you throughout admission and thinks you are safe for discharge.   1.You will start oral lasix 40 mg one tablet daily on Monday 4/1 and do this daily moving forward 2. Start iron 65mg  supplementation once daily 3. Stop losartan 50mg  daily 4.Stop carvedilol 25mg  BID 5. Decrease amlodipine to 5mg  daily 6. Follow up with the nephrology doctors 7. Follow up with your primary care doctor within 1 week  Signed: Iona Coach, MD 02/27/2023, 2:45 PM   Pager: Iona Coach, MD Internal Medicine Resident, PGY-1 [facility]  Pager: [phone]

## 2023-02-27 NOTE — Discharge Instructions (Signed)
You were hospitalized for heart failure exacerbation and acute kidney injury. You were treated with IV lasix. Your breathing improved with this and removal of your nasal stents. Nephrology followed you throughout admission and thinks you are safe for discharge.   1.You will start oral lasix 40 mg one tablet daily on Monday 4/1 and do this daily moving forward 2. Start iron 65mg  supplementation once daily 3. Follow up with the nephrology doctors 4. Follow up with your primary care doctor within 1 week

## 2023-02-27 NOTE — Progress Notes (Signed)
Mobility Specialist: Progress Note   02/27/23 1449  Mobility  Activity Ambulated with assistance in hallway  Level of Assistance Contact guard assist, steadying assist  Assistive Device Other (Comment) (HHA)  Distance Ambulated (ft) 400 ft  Activity Response Tolerated well  Mobility Referral Yes  $Mobility charge 1 Mobility   Pre-Mobility: 75 HR, 96% SpO2 Post-Mobility: 74 HR, 91% SpO2  Received pt in bed having no complaints and agreeable to mobility. Pt was asymptomatic throughout ambulation and returned to room w/o fault. Left in bed w/ call bell in reach and all needs met.  [place] Michaell Grider Mobility Specialist Please contact via SecureChat or Rehab office at [phone]

## 2023-03-02 ENCOUNTER — Telehealth: Payer: Self-pay

## 2023-03-02 NOTE — Transitions of Care (Post Inpatient/ED Visit) (Unsigned)
   03/02/2023  Name: Sarah Kline MRN: [id number] DOB: Sep 07, 1966  Today's TOC FU Call Status: Today's TOC FU Call Status:: Unsuccessul Call (1st Attempt) Unsuccessful Call (1st Attempt) Date: 03/02/23  Attempted to reach the patient regarding the most recent Inpatient/ED visit.  Follow Up Plan: Additional outreach attempts will be made to reach the patient to complete the Transitions of Care (Post Inpatient/ED visit) call.   Signature Juanda Crumble, [place] Direct Dial [phone]

## 2023-03-03 NOTE — Transitions of Care (Post Inpatient/ED Visit) (Signed)
   03/03/2023  Name: Sarah Kline MRN: [id number] DOB: [birth year]  Today's TOC FU Call Status: Today's TOC FU Call Status:: Successful TOC FU Call Competed Unsuccessful Call (1st Attempt) Date: 03/02/23 [facility] FU Call Complete Date: 03/03/23  Transition Care Management Follow-up Telephone Call Date of Discharge: 02/27/23 Discharge Facility: [facility]) Type of Discharge: Inpatient Admission Primary Inpatient Discharge Diagnosis:: acute kidney failure How have you been since you were released from the hospital?: Better Any questions or concerns?: No  Items Reviewed: Did you receive and understand the discharge instructions provided?: Yes Medications obtained and verified?: Yes (Medications Reviewed) Any new allergies since your discharge?: No Dietary orders reviewed?: Yes Do you have support at home?: No  Home Care and Equipment/Supplies: [place] Ordered?: NA Any new equipment or medical supplies ordered?: NA  Functional Questionnaire: Do you need assistance with bathing/showering or dressing?: No Do you need assistance with meal preparation?: No Do you need assistance with eating?: No Do you have difficulty maintaining continence: No Do you need assistance with getting out of bed/getting out of a chair/moving?: No Do you have difficulty managing or taking your medications?: No  Follow up appointments reviewed: PCP Follow-up appointment confirmed?: No (sent messag to staff to schedule) MD Provider Line Number:[phone] Given: No [facility] Follow-up appointment confirmed?: NA Do you need transportation to your follow-up appointment?: No Do you understand care options if your condition(s) worsen?: Yes-patient verbalized understanding    [place], [place] Nurse Health Advisor Direct Dial [phone]

## 2023-03-25 ENCOUNTER — Telehealth: Payer: Self-pay

## 2023-03-25 NOTE — Telephone Encounter (Signed)
Shanda Bumps from [facility] called she stated the patients spouse called her and stated he was supposed to receive a work note from when the patient was seen in the ED on 3/23. Shanda Bumps call back @ [phone]

## 2023-03-26 ENCOUNTER — Telehealth: Payer: Self-pay | Admitting: Student

## 2023-03-26 NOTE — Telephone Encounter (Signed)
Error

## 2023-03-30 ENCOUNTER — Other Ambulatory Visit: Payer: Self-pay

## 2023-04-23 NOTE — Progress Notes (Signed)
Patient Care Team: Hillery Aldo, NP as PCP - General Manus Rudd, MD as Consulting Physician (General Surgery) Serena Croissant, MD as Consulting Physician (Hematology and Oncology) Lonie Peak, MD as Attending Physician (Radiation Oncology)  DIAGNOSIS: No diagnosis found.  SUMMARY OF ONCOLOGIC HISTORY: Oncology History  Ductal carcinoma in situ (DCIS) of left breast  05/14/2022 Initial Diagnosis   Screening mammogram detected left breast calcifications measuring 1.9 cm, biopsy revealed grade 2 DCIS and CSL, ER 100%, PR 95%   05/21/2022 Cancer Staging   Staging form: Breast, AJCC 8th Edition - Clinical stage from 05/21/2022: Stage 0 (cTis (DCIS), cN0, cM0, ER+, PR+) - Signed by Serena Croissant, MD on 05/21/2022 Stage prefix: Initial diagnosis Nuclear grade: G2   05/27/2022 Surgery   Left lumpectomy: DCIS intermediate grade with focal necrosis and calcifications spanning a fibrotic area 4.6 cm in a discontinuous manner, no invasive cancer, ER 100%, PR 95%   07/09/2022 - 07/29/2022 Radiation Therapy   Site Technique Total Dose (Gy) Dose per Fx (Gy) Completed Fx Beam Energies  Breast, Left: Breast_L 3D 40.05/40.05 2.67 15/15 10XFFF     08/2022 -  Anti-estrogen oral therapy   10 mg Tamoxifen     CHIEF COMPLIANT: Follow-up on tamoxifen  INTERVAL HISTORY: Sarah Kline is a 57 y.o. female is here because of recent diagnosis of left breast cancer. She presents to the clinic today for a follow-up    ALLERGIES:  has No Known Allergies.  MEDICATIONS:  Current Outpatient Medications  Medication Sig Dispense Refill   amLODipine (NORVASC) 5 MG tablet Take 1 tablet (5 mg total) by mouth daily. 30 tablet 0   atorvastatin (LIPITOR) 40 MG tablet Take 40 mg by mouth at bedtime.     Blood Glucose Monitoring Suppl (PRODIGY AUTOCODE BLOOD GLUCOSE) w/Device KIT 1 kit by Does not apply route as directed.     calcitRIOL (ROCALTROL) 0.25 MCG capsule TAKE 1 CAPSULE EVERY DAY (Patient taking  differently: Take 0.25 mcg by mouth every other day.) 90 capsule 3   furosemide (LASIX) 40 MG tablet Take 1 tablet (40 mg total) by mouth daily. 30 tablet 0   iron polysaccharides (NIFEREX) 150 MG capsule Take 1 capsule (150 mg total) by mouth daily. 30 capsule 0   sodium bicarbonate 650 MG tablet Take 1,300 mg by mouth 2 (two) times daily.  6   tamoxifen (NOLVADEX) 10 MG tablet Take 1 tablet (10 mg total) by mouth daily. 90 tablet 3   No current facility-administered medications for this visit.    PHYSICAL EXAMINATION: ECOG PERFORMANCE STATUS: {CHL ONC ECOG [phone]}  There were no vitals filed for this visit. There were no vitals filed for this visit.  BREAST:*** No palpable masses or nodules in either right or left breasts. No palpable axillary supraclavicular or infraclavicular adenopathy no breast tenderness or nipple discharge. (exam performed in the presence of a chaperone)  LABORATORY DATA:  I have reviewed the data as listed    Latest Ref Rng & Units 02/27/2023   12:35 PM 02/27/2023    7:13 AM 02/26/2023   11:26 PM  CMP  Glucose 70 - 99 mg/dL 161  096  045   BUN 6 - 20 mg/dL 409  811  914   Creatinine 0.44 - 1.00 mg/dL 7.82  9.56  2.13   Sodium 135 - 145 mmol/L 130  133  130   Potassium 3.5 - 5.1 mmol/L 3.9  4.1  4.2   Chloride 98 - 111 mmol/L 90  91  91   CO2 22 - 32 mmol/L 25  26  24    Calcium 8.9 - 10.3 mg/dL 8.6  9.2  8.9     Lab Results  Component Value Date   WBC 4.9 02/24/2023   HGB 10.3 (L) 02/24/2023   HCT 29.6 (L) 02/24/2023   MCV 87.1 02/24/2023   PLT 194 02/24/2023   NEUTROABS 7.2 02/21/2023    ASSESSMENT & PLAN:  No problem-specific Assessment & Plan notes found for this encounter.    No orders of the defined types were placed in this encounter.  The patient has a good understanding of the overall plan. she agrees with it. she will call with any problems that may develop before the next visit here. Total time spent: 30 mins including face  to face time and time spent for planning, charting and co-ordination of care   Sherlyn Lick, CMA 04/23/23    I Janan Ridge am acting as a Neurosurgeon for [employer]  ***

## 2023-04-29 ENCOUNTER — Inpatient Hospital Stay: Payer: Medicare HMO | Attending: Hematology and Oncology | Admitting: Hematology and Oncology

## 2023-04-29 VITALS — BP 146/61 | HR 61 | Temp 97.7°F | Resp 17 | Wt 216.0 lb

## 2023-04-29 DIAGNOSIS — Z79899 Other long term (current) drug therapy: Secondary | ICD-10-CM | POA: Diagnosis not present

## 2023-04-29 DIAGNOSIS — Z79810 Long term (current) use of selective estrogen receptor modulators (SERMs): Secondary | ICD-10-CM | POA: Insufficient documentation

## 2023-04-29 DIAGNOSIS — D0512 Intraductal carcinoma in situ of left breast: Secondary | ICD-10-CM | POA: Diagnosis present

## 2023-04-29 NOTE — Assessment & Plan Note (Addendum)
05/27/2022:Left lumpectomy: DCIS intermediate grade with focal necrosis and calcifications spanning a fibrotic area 4.6 cm in a discontinuous manner, no invasive cancer, margins negative, ER 100%, PR 95%   Treatment plan: 1. adjuvant radiation therapy 07/10/2022-07/30/2022 2. Followed by antiestrogen therapy with tamoxifen 5 years started September 2023   Patient is visually impaired.  Tamoxifen toxicities: Denies any adverse effects to tamoxifen.  Breast cancer surveillance: Left breast swelling: Breast exam 04/29/2023: Large seroma is felt in the left breast Mammogram scheduled for 05/11/2023.  I will order a left breast ultrasound as well.  Return to clinic in 1 year for follow-up

## 2023-05-10 ENCOUNTER — Other Ambulatory Visit: Payer: Self-pay | Admitting: Hematology and Oncology

## 2023-05-21 ENCOUNTER — Other Ambulatory Visit: Payer: Self-pay | Admitting: Hematology and Oncology

## 2023-05-21 DIAGNOSIS — D0512 Intraductal carcinoma in situ of left breast: Secondary | ICD-10-CM

## 2023-06-10 ENCOUNTER — Ambulatory Visit
Admission: RE | Admit: 2023-06-10 | Discharge: 2023-06-10 | Disposition: A | Discharge status: Home / Self Care | Payer: Medicare HMO | Source: Ambulatory Visit | Attending: Hematology and Oncology | Admitting: Hematology and Oncology

## 2023-06-10 DIAGNOSIS — D0512 Intraductal carcinoma in situ of left breast: Secondary | ICD-10-CM

## 2023-07-27 ENCOUNTER — Other Ambulatory Visit: Payer: Self-pay | Admitting: Student

## 2023-07-27 DIAGNOSIS — R0989 Other specified symptoms and signs involving the circulatory and respiratory systems: Secondary | ICD-10-CM

## 2023-07-28 ENCOUNTER — Other Ambulatory Visit: Payer: Self-pay | Admitting: Student

## 2023-07-28 DIAGNOSIS — I70203 Unspecified atherosclerosis of native arteries of extremities, bilateral legs: Secondary | ICD-10-CM

## 2023-07-30 ENCOUNTER — Inpatient Hospital Stay: Admission: RE | Admit: 2023-07-30 | Payer: Medicare HMO | Source: Ambulatory Visit

## 2023-08-04 ENCOUNTER — Encounter (HOSPITAL_BASED_OUTPATIENT_CLINIC_OR_DEPARTMENT_OTHER): Payer: Self-pay

## 2023-08-04 ENCOUNTER — Other Ambulatory Visit: Payer: Self-pay

## 2023-08-04 ENCOUNTER — Emergency Department (HOSPITAL_BASED_OUTPATIENT_CLINIC_OR_DEPARTMENT_OTHER)
Admission: EM | Admit: 2023-08-04 | Discharge: 2023-08-04 | Disposition: A | Discharge status: Home / Self Care | Payer: Medicare HMO | Attending: Emergency Medicine | Admitting: Emergency Medicine

## 2023-08-04 DIAGNOSIS — N189 Chronic kidney disease, unspecified: Secondary | ICD-10-CM | POA: Diagnosis not present

## 2023-08-04 DIAGNOSIS — I129 Hypertensive chronic kidney disease with stage 1 through stage 4 chronic kidney disease, or unspecified chronic kidney disease: Secondary | ICD-10-CM | POA: Insufficient documentation

## 2023-08-04 DIAGNOSIS — E1122 Type 2 diabetes mellitus with diabetic chronic kidney disease: Secondary | ICD-10-CM | POA: Insufficient documentation

## 2023-08-04 DIAGNOSIS — R739 Hyperglycemia, unspecified: Secondary | ICD-10-CM | POA: Diagnosis present

## 2023-08-04 DIAGNOSIS — Z79899 Other long term (current) drug therapy: Secondary | ICD-10-CM | POA: Insufficient documentation

## 2023-08-04 DIAGNOSIS — E1165 Type 2 diabetes mellitus with hyperglycemia: Secondary | ICD-10-CM | POA: Insufficient documentation

## 2023-08-04 LAB — URINALYSIS, ROUTINE W REFLEX MICROSCOPIC
Bilirubin Urine: NEGATIVE
Glucose, UA: >1000 mg/dL — AB
Ketones, ur: NEGATIVE mg/dL
Leukocytes,Ua: NEGATIVE
Nitrite: NEGATIVE
Protein, ur: 100 mg/dL — AB
Specific Gravity, Urine: 1.008 (ref 1.005–1.030)
pH: 6 (ref 5.0–8.0)

## 2023-08-04 LAB — BASIC METABOLIC PANEL
Anion gap: 12 (ref 5–15)
BUN: 81 mg/dL — ABNORMAL HIGH (ref 6–20)
CO2: 21 mmol/L — ABNORMAL LOW (ref 22–32)
Calcium: 9 mg/dL (ref 8.9–10.3)
Chloride: 102 mmol/L (ref 98–111)
Creatinine, Ser: 4.36 mg/dL — ABNORMAL HIGH (ref 0.44–1.00)
GFR, Estimated: 11 mL/min — ABNORMAL LOW (ref >60)
Glucose, Bld: 391 mg/dL — ABNORMAL HIGH (ref 70–99)
Potassium: 4.6 mmol/L (ref 3.5–5.1)
Sodium: 135 mmol/L (ref 135–145)

## 2023-08-04 LAB — CBC
HCT: 34.9 % — ABNORMAL LOW (ref 36.0–46.0)
Hemoglobin: 11.9 g/dL — ABNORMAL LOW (ref 12.0–15.0)
MCH: 29.5 pg (ref 26.0–34.0)
MCHC: 34.1 g/dL (ref 30.0–36.0)
MCV: 86.6 fL (ref 80.0–100.0)
Platelets: 227 10*3/uL (ref 150–400)
RBC: 4.03 MIL/uL (ref 3.87–5.11)
RDW: 12.5 % (ref 11.5–15.5)
WBC: 6.4 10*3/uL (ref 4.0–10.5)
nRBC: 0 % (ref 0.0–0.2)

## 2023-08-04 LAB — CBG MONITORING, ED: Glucose-Capillary: 388 mg/dL — ABNORMAL HIGH (ref 70–99)

## 2023-08-04 NOTE — ED Provider Notes (Signed)
[place] EMERGENCY DEPARTMENT AT [facility] Provider Note   CSN: [id number] Arrival date & time: 08/04/23  1049     History  Chief Complaint  Patient presents with   Hyperglycemia    Sarah Kline is a 57 y.o. female with history of type 2 diabetes, CKD, hypertension, presents after being seen by her PCP earlier today and told she had a elevated blood sugar and to come to the ER.  She notes a history of type 2 diabetes but denies being on any medications for this.  Denies any nausea or vomiting.  Does note some increased thirst.  She overall feels at her baseline.  Denies any recent diet changes or recent infections.   Hyperglycemia      Home Medications Prior to Admission medications   Medication Sig Start Date End Date Taking? Authorizing Provider  amLODipine (NORVASC) 5 MG tablet Take 1 tablet (5 mg total) by mouth daily. 02/28/23 03/30/23  Willette Cluster, MD  atorvastatin (LIPITOR) 40 MG tablet Take 40 mg by mouth at bedtime.    [provider]  Blood Glucose Monitoring Suppl (PRODIGY AUTOCODE BLOOD GLUCOSE) w/Device KIT 1 kit by Does not apply route as directed.    [provider]  calcitRIOL (ROCALTROL) 0.25 MCG capsule TAKE 1 CAPSULE EVERY DAY Patient taking differently: Take 0.25 mcg by mouth every other day. 08/26/21   Simmons-Robinson, Tawanna Cooler, MD  furosemide (LASIX) 40 MG tablet Take 1 tablet (40 mg total) by mouth daily. 03/02/23   Willette Cluster, MD  iron polysaccharides (NIFEREX) 150 MG capsule Take 1 capsule (150 mg total) by mouth daily. 02/27/23 03/29/23  Willette Cluster, MD  sodium bicarbonate 650 MG tablet Take 1,300 mg by mouth 2 (two) times daily. 06/02/18   [provider]  tamoxifen (NOLVADEX) 10 MG tablet TAKE 1 TABLET EVERY DAY 05/11/23   Serena Croissant, MD      Allergies    Patient has no known allergies.    Review of Systems   Review of Systems  Constitutional:  Negative for appetite change.    Physical Exam Updated  Vital Signs BP 135/67 (BP Location: Left Arm)   Pulse (!) 59   Temp 97.9 F (36.6 C)   Resp 16   Ht 5\' 4"  (1.626 m)   Wt 99.8 kg   LMP 04/28/2018   SpO2 98%   BMI 37.76 kg/m  Physical Exam Vitals and nursing note reviewed.  Constitutional:      Appearance: Normal appearance.     Comments: Comfortable appearing in bed, no vomiting.   HENT:     Head: Atraumatic.     Mouth/Throat:     Mouth: Mucous membranes are moist.  Cardiovascular:     Rate and Rhythm: Normal rate and regular rhythm.  Pulmonary:     Effort: Pulmonary effort is normal.  Skin:    Capillary Refill: Capillary refill takes less than 2 seconds.  Neurological:     General: No focal deficit present.     Mental Status: She is alert.  Psychiatric:        Mood and Affect: Mood normal.        Behavior: Behavior normal.     ED Results / Procedures / Treatments   Labs (all labs ordered are listed, but only abnormal results are displayed) Labs Reviewed  BASIC METABOLIC PANEL - Abnormal; Notable for the following components:      Result Value   CO2 21 (*)    Glucose, Bld 391 (*)  BUN 81 (*)    Creatinine, Ser 4.36 (*)    GFR, Estimated 11 (*)    All other components within normal limits  CBC - Abnormal; Notable for the following components:   Hemoglobin 11.9 (*)    HCT 34.9 (*)    All other components within normal limits  URINALYSIS, ROUTINE W REFLEX MICROSCOPIC - Abnormal; Notable for the following components:   Glucose, UA >1,000 (*)    Hgb urine dipstick TRACE (*)    Protein, ur 100 (*)    Bacteria, UA FEW (*)    All other components within normal limits  CBG MONITORING, ED - Abnormal; Notable for the following components:   Glucose-Capillary 388 (*)    All other components within normal limits    EKG None  Radiology No results found.  Procedures Procedures    Medications Ordered in ED Medications - No data to display  ED Course/ Medical Decision Making/ A&P                                  Medical Decision Making Amount and/or Complexity of Data Reviewed Labs: ordered.   57 y.o. female with pertinent past medical history of type 2 diabetes, CKD, hypertension presents to the ED for concern of elevated blood sugar found at her PCP visit earlier today known history of type 2 diabetes  Differential diagnosis includes but is not limited to hyperglycemia, type 2 diabetes, HHS, DKA  ED Course:  Patient was sent over by her PCP for evaluation of an elevated blood sugar.  Her glucose today is 391.  Does not have any symptoms aside for some increased thirst.  Denies any nausea or vomiting, changes in mental status.  No tachypnea, well-appearing.  Does not appear dehydrated on exam.  No indication for IV fluids at this time.  No concern for HHS or DKA, no electrolyte abnormalities, no anion gap.  I considered starting her on metformin at home, but she is unable to be started on this due to her poor kidney function. Creatinine at 4.36 and GFR 11.  I discussed with her that she needs to follow-up with her PCP for regular diabetic checkups.  They should occur every 3 months.  I also discussed that she needs to keep well-hydrated at home.  Impression: Hyperglycemia  Disposition:  The patient was discharged home with instructions to follow-up with PCP for diabetic check ups Return precautions given.  Lab Tests: I Ordered, and personally interpreted labs.  The pertinent results include:   CBC without any leukocytosis.  Hemoglobin at patient's baseline BMP with elevated glucose 391.  No anion gap.  Elevated creatinine at 4.36, GFR 11, this is consistent with patient's baseline Urinalysis with glucose, proteinuria  Co morbidities that complicate the patient evaluation  type 2 diabetes, CKD, hypertension              Final Clinical Impression(s) / ED Diagnoses Final diagnoses:  Hyperglycemia due to diabetes mellitus [facility])    Rx / DC Orders ED Discharge Orders      None         Arabella Merles, PA-C 08/04/23 1312    Ernie Avena, MD 08/04/23 [phone]

## 2023-08-04 NOTE — Discharge Instructions (Signed)
Please follow-up with your PCP for regular diabetic checkups.  They should occur every 3 months.  Please continue to keep well-hydrated at home.  You are unable to be started on any medications for diabetes due to your decreased kidney function.   Return to the ER for any uncontrolled nausea or vomiting, dizziness, altered mental status, any other new or concerning symptoms.

## 2023-08-04 NOTE — ED Notes (Signed)
Pt discharged to home using teachback Method. Discharge instructions have been discussed with patient and/or family members. Pt verbally acknowledges understanding d/c instructions, has been given opportunity for questions to be answered, and endorses comprehension to checkout at registration before leaving.  

## 2023-08-04 NOTE — ED Triage Notes (Signed)
Pt to ED c/o hyperglycemia. Reports went to PCP today and was told blood sugar was high and to come to ED. Reports known DM2, but takes no medications and/or insulin.

## 2023-08-11 ENCOUNTER — Ambulatory Visit
Admission: RE | Admit: 2023-08-11 | Discharge: 2023-08-11 | Disposition: A | Discharge status: Home / Self Care | Payer: Medicare HMO | Source: Ambulatory Visit | Attending: Student

## 2023-08-11 DIAGNOSIS — I70203 Unspecified atherosclerosis of native arteries of extremities, bilateral legs: Secondary | ICD-10-CM

## 2023-08-11 DIAGNOSIS — R0989 Other specified symptoms and signs involving the circulatory and respiratory systems: Secondary | ICD-10-CM

## 2023-09-15 ENCOUNTER — Ambulatory Visit (INDEPENDENT_AMBULATORY_CARE_PROVIDER_SITE_OTHER): Payer: Medicare HMO

## 2023-09-15 ENCOUNTER — Encounter (INDEPENDENT_AMBULATORY_CARE_PROVIDER_SITE_OTHER): Payer: Self-pay

## 2023-09-15 VITALS — Ht 64.0 in | Wt 220.0 lb

## 2023-09-15 DIAGNOSIS — J343 Hypertrophy of nasal turbinates: Secondary | ICD-10-CM

## 2023-09-15 DIAGNOSIS — R1310 Dysphagia, unspecified: Secondary | ICD-10-CM

## 2023-09-15 DIAGNOSIS — J310 Chronic rhinitis: Secondary | ICD-10-CM

## 2023-09-16 DIAGNOSIS — J310 Chronic rhinitis: Secondary | ICD-10-CM | POA: Insufficient documentation

## 2023-09-16 DIAGNOSIS — J343 Hypertrophy of nasal turbinates: Secondary | ICD-10-CM | POA: Insufficient documentation

## 2023-09-16 NOTE — Progress Notes (Signed)
Patient ID: Sarah Kline, female   DOB: 1966/08/24, 57 y.o.   MRN: [id number]  Follow-up: Chronic nasal obstruction  HPI: The patient is a 57 year old female who returns today for her follow-up evaluation.  The patient was previously seen for chronic nasal obstruction.  She underwent septoplasty and turbinate reduction surgery in March 2024.  The patient returns today reporting improvement in her nasal breathing.  She has occasional whistling noise when breathing through the nose.  She denies any facial pain, fever, or visual change.  She has a new complaints of dysphagia and frequent choking sensation.  This is especially true with drinking water.  Exam: General: Communicates without difficulty, well nourished, no acute distress. Head: Normocephalic, no evidence injury, no tenderness, facial buttresses intact without stepoff. Face/sinus: No tenderness to palpation and percussion. Facial movement is normal and symmetric. Eyes: PERRL, EOMI. No scleral icterus, conjunctivae clear. Neuro: CN II exam reveals vision grossly intact.  No nystagmus at any point of gaze. Ears: Auricles well formed without lesions.  Ear canals are intact without mass or lesion.  No erythema or edema is appreciated.  The TMs are intact without fluid. Nose: External evaluation reveals normal support and skin without lesions.  Dorsum is intact.  Anterior rhinoscopy reveals mildly congested mucosa over anterior aspect of inferior turbinates and intact septum.  Her nasal passageways are patent.  Oral:  Oral cavity and oropharynx are intact, symmetric, without erythema or edema.  Mucosa is moist without lesions. Neck: Full range of motion without pain.  There is no significant lymphadenopathy.  No masses palpable.  Thyroid bed within normal limits to palpation.  Parotid glands and submandibular glands equal bilaterally without mass.  Trachea is midline. Neuro:  CN 2-12 grossly intact.    Assessment: 1.  The patient's septum and  turbinates are well-healed from her previous surgery.  Her nasal passageways are patent bilaterally. 2.  New complaint of occasional dysphagia and choking sensation.  Plan: 1.  The physical exam findings are reviewed with the patient.  2.  Flonase nasal spray as needed to treat her mild nasal congestion. 3.  Will refer to laryngologist Dr. Irene Pap to evaluate her dysphagia and choking sensation.

## 2024-02-27 ENCOUNTER — Other Ambulatory Visit: Payer: Self-pay | Admitting: Hematology and Oncology

## 2024-05-02 ENCOUNTER — Inpatient Hospital Stay: Payer: Medicare HMO | Attending: Hematology and Oncology | Admitting: Hematology and Oncology

## 2024-05-02 NOTE — Assessment & Plan Note (Deleted)
 05/27/2022:Left lumpectomy: DCIS intermediate grade with focal necrosis and calcifications spanning a fibrotic area 4.6 cm in a discontinuous manner, no invasive cancer, margins negative, ER 100%, PR 95%   Treatment plan: 1. adjuvant radiation therapy 07/10/2022-07/30/2022 2. Followed by antiestrogen therapy with tamoxifen  5 years started September 2023   Patient is visually impaired.   Tamoxifen  toxicities: Denies any adverse effects to tamoxifen .   Breast cancer surveillance: Left breast swelling: Breast exam 05/02/2024: Large seroma is felt in the left breast Mammogram and ultrasound 06/10/2023.  Benign, postsurgical seroma 5.3 cm   Return to clinic in 1 year for follow-up

## 2024-05-09 ENCOUNTER — Other Ambulatory Visit: Payer: Self-pay | Admitting: Student

## 2024-05-09 DIAGNOSIS — Z9889 Other specified postprocedural states: Secondary | ICD-10-CM

## 2024-06-08 ENCOUNTER — Other Ambulatory Visit: Payer: Self-pay | Admitting: Student

## 2024-06-08 DIAGNOSIS — Z9889 Other specified postprocedural states: Secondary | ICD-10-CM

## 2024-06-10 ENCOUNTER — Ambulatory Visit
Admission: RE | Admit: 2024-06-10 | Discharge: 2024-06-10 | Disposition: A | Discharge status: Home / Self Care | Source: Ambulatory Visit | Attending: Student

## 2024-06-10 DIAGNOSIS — Z9889 Other specified postprocedural states: Secondary | ICD-10-CM

## 2024-09-06 ENCOUNTER — Encounter (HOSPITAL_COMMUNITY): Payer: Self-pay

## 2024-09-06 ENCOUNTER — Emergency Department (HOSPITAL_COMMUNITY)

## 2024-09-06 ENCOUNTER — Other Ambulatory Visit: Payer: Self-pay

## 2024-09-06 ENCOUNTER — Inpatient Hospital Stay (HOSPITAL_COMMUNITY)
Admission: EM | Admit: 2024-09-06 | Discharge: 2024-09-09 | DRG: 682 | Disposition: A | Discharge status: Home / Self Care

## 2024-09-06 DIAGNOSIS — D509 Iron deficiency anemia, unspecified: Secondary | ICD-10-CM | POA: Diagnosis present

## 2024-09-06 DIAGNOSIS — D631 Anemia in chronic kidney disease: Secondary | ICD-10-CM | POA: Diagnosis present

## 2024-09-06 DIAGNOSIS — Z825 Family history of asthma and other chronic lower respiratory diseases: Secondary | ICD-10-CM

## 2024-09-06 DIAGNOSIS — E1065 Type 1 diabetes mellitus with hyperglycemia: Secondary | ICD-10-CM | POA: Diagnosis present

## 2024-09-06 DIAGNOSIS — X501XXA Overexertion from prolonged static or awkward postures, initial encounter: Secondary | ICD-10-CM | POA: Diagnosis not present

## 2024-09-06 DIAGNOSIS — C50912 Malignant neoplasm of unspecified site of left female breast: Secondary | ICD-10-CM | POA: Diagnosis present

## 2024-09-06 DIAGNOSIS — Z8673 Personal history of transient ischemic attack (TIA), and cerebral infarction without residual deficits: Secondary | ICD-10-CM

## 2024-09-06 DIAGNOSIS — Z8249 Family history of ischemic heart disease and other diseases of the circulatory system: Secondary | ICD-10-CM

## 2024-09-06 DIAGNOSIS — Z79810 Long term (current) use of selective estrogen receptor modulators (SERMs): Secondary | ICD-10-CM

## 2024-09-06 DIAGNOSIS — D571 Sickle-cell disease without crisis: Secondary | ICD-10-CM | POA: Diagnosis present

## 2024-09-06 DIAGNOSIS — E872 Acidosis, unspecified: Secondary | ICD-10-CM | POA: Diagnosis present

## 2024-09-06 DIAGNOSIS — E1022 Type 1 diabetes mellitus with diabetic chronic kidney disease: Secondary | ICD-10-CM | POA: Diagnosis present

## 2024-09-06 DIAGNOSIS — I5032 Chronic diastolic (congestive) heart failure: Secondary | ICD-10-CM | POA: Diagnosis present

## 2024-09-06 DIAGNOSIS — I10 Essential (primary) hypertension: Secondary | ICD-10-CM | POA: Diagnosis not present

## 2024-09-06 DIAGNOSIS — E1165 Type 2 diabetes mellitus with hyperglycemia: Secondary | ICD-10-CM | POA: Diagnosis not present

## 2024-09-06 DIAGNOSIS — Z794 Long term (current) use of insulin: Secondary | ICD-10-CM

## 2024-09-06 DIAGNOSIS — D0512 Intraductal carcinoma in situ of left breast: Secondary | ICD-10-CM | POA: Diagnosis not present

## 2024-09-06 DIAGNOSIS — N179 Acute kidney failure, unspecified: Secondary | ICD-10-CM | POA: Diagnosis present

## 2024-09-06 DIAGNOSIS — Z923 Personal history of irradiation: Secondary | ICD-10-CM | POA: Diagnosis not present

## 2024-09-06 DIAGNOSIS — Z803 Family history of malignant neoplasm of breast: Secondary | ICD-10-CM

## 2024-09-06 DIAGNOSIS — E785 Hyperlipidemia, unspecified: Secondary | ICD-10-CM | POA: Diagnosis present

## 2024-09-06 DIAGNOSIS — E8770 Fluid overload, unspecified: Secondary | ICD-10-CM

## 2024-09-06 DIAGNOSIS — J810 Acute pulmonary edema: Secondary | ICD-10-CM | POA: Diagnosis present

## 2024-09-06 DIAGNOSIS — N189 Chronic kidney disease, unspecified: Secondary | ICD-10-CM

## 2024-09-06 DIAGNOSIS — S99911A Unspecified injury of right ankle, initial encounter: Secondary | ICD-10-CM | POA: Diagnosis present

## 2024-09-06 DIAGNOSIS — I132 Hypertensive heart and chronic kidney disease with heart failure and with stage 5 chronic kidney disease, or end stage renal disease: Secondary | ICD-10-CM | POA: Diagnosis present

## 2024-09-06 DIAGNOSIS — I5033 Acute on chronic diastolic (congestive) heart failure: Secondary | ICD-10-CM | POA: Diagnosis present

## 2024-09-06 DIAGNOSIS — D649 Anemia, unspecified: Secondary | ICD-10-CM | POA: Diagnosis present

## 2024-09-06 DIAGNOSIS — J9601 Acute respiratory failure with hypoxia: Secondary | ICD-10-CM | POA: Diagnosis present

## 2024-09-06 DIAGNOSIS — Z823 Family history of stroke: Secondary | ICD-10-CM

## 2024-09-06 DIAGNOSIS — Z6841 Body Mass Index (BMI) 40.0 and over, adult: Secondary | ICD-10-CM | POA: Diagnosis not present

## 2024-09-06 DIAGNOSIS — I5031 Acute diastolic (congestive) heart failure: Secondary | ICD-10-CM | POA: Diagnosis not present

## 2024-09-06 DIAGNOSIS — Z79899 Other long term (current) drug therapy: Secondary | ICD-10-CM

## 2024-09-06 DIAGNOSIS — E66813 Obesity, class 3: Secondary | ICD-10-CM | POA: Diagnosis present

## 2024-09-06 DIAGNOSIS — N2581 Secondary hyperparathyroidism of renal origin: Secondary | ICD-10-CM | POA: Diagnosis present

## 2024-09-06 DIAGNOSIS — N185 Chronic kidney disease, stage 5: Secondary | ICD-10-CM | POA: Diagnosis present

## 2024-09-06 DIAGNOSIS — Z833 Family history of diabetes mellitus: Secondary | ICD-10-CM | POA: Diagnosis not present

## 2024-09-06 DIAGNOSIS — H548 Legal blindness, as defined in USA: Secondary | ICD-10-CM | POA: Diagnosis present

## 2024-09-06 DIAGNOSIS — N289 Disorder of kidney and ureter, unspecified: Secondary | ICD-10-CM | POA: Diagnosis not present

## 2024-09-06 LAB — COMPREHENSIVE METABOLIC PANEL WITH GFR
ALT: 28 U/L (ref 0–44)
AST: 21 U/L (ref 15–41)
Albumin: 3.2 g/dL — ABNORMAL LOW (ref 3.5–5.0)
Alkaline Phosphatase: 71 U/L (ref 38–126)
Anion gap: 8 (ref 5–15)
BUN: 76 mg/dL — ABNORMAL HIGH (ref 6–20)
CO2: 21 mmol/L — ABNORMAL LOW (ref 22–32)
Calcium: 8.8 mg/dL — ABNORMAL LOW (ref 8.9–10.3)
Chloride: 114 mmol/L — ABNORMAL HIGH (ref 98–111)
Creatinine, Ser: 6.58 mg/dL — ABNORMAL HIGH (ref 0.44–1.00)
GFR, Estimated: 7 mL/min — ABNORMAL LOW (ref >60)
Glucose, Bld: 114 mg/dL — ABNORMAL HIGH (ref 70–99)
Potassium: 4.8 mmol/L (ref 3.5–5.1)
Sodium: 143 mmol/L (ref 135–145)
Total Bilirubin: 1.3 mg/dL — ABNORMAL HIGH (ref 0.0–1.2)
Total Protein: 6 g/dL — ABNORMAL LOW (ref 6.5–8.1)

## 2024-09-06 LAB — CBC WITH DIFFERENTIAL/PLATELET
Abs Immature Granulocytes: 0.05 K/uL (ref 0.00–0.07)
Basophils Absolute: 0.1 K/uL (ref 0.0–0.1)
Basophils Relative: 1 %
Eosinophils Absolute: 0.2 K/uL (ref 0.0–0.5)
Eosinophils Relative: 3 %
HCT: 30.8 % — ABNORMAL LOW (ref 36.0–46.0)
Hemoglobin: 9.3 g/dL — ABNORMAL LOW (ref 12.0–15.0)
Immature Granulocytes: 1 %
Lymphocytes Relative: 11 %
Lymphs Abs: 0.8 K/uL (ref 0.7–4.0)
MCH: 29.6 pg (ref 26.0–34.0)
MCHC: 30.2 g/dL (ref 30.0–36.0)
MCV: 98.1 fL (ref 80.0–100.0)
Monocytes Absolute: 0.6 K/uL (ref 0.1–1.0)
Monocytes Relative: 8 %
Neutro Abs: 6 K/uL (ref 1.7–7.7)
Neutrophils Relative %: 76 %
Platelets: 198 K/uL (ref 150–400)
RBC: 3.14 MIL/uL — ABNORMAL LOW (ref 3.87–5.11)
RDW: 14.3 % (ref 11.5–15.5)
WBC: 7.8 K/uL (ref 4.0–10.5)
nRBC: 0 % (ref 0.0–0.2)

## 2024-09-06 LAB — TROPONIN I (HIGH SENSITIVITY)
Troponin I (High Sensitivity): 59 ng/L — ABNORMAL HIGH (ref <18)
Troponin I (High Sensitivity): 59 ng/L — ABNORMAL HIGH (ref <18)

## 2024-09-06 LAB — BRAIN NATRIURETIC PEPTIDE: B Natriuretic Peptide: 672.2 pg/mL — ABNORMAL HIGH (ref 0.0–100.0)

## 2024-09-06 MED ORDER — PROCHLORPERAZINE EDISYLATE 10 MG/2ML IJ SOLN: 5.0000 mg | Freq: Four times a day (QID) | INTRAMUSCULAR | Status: DC | PRN

## 2024-09-06 MED ORDER — ALBUTEROL SULFATE (2.5 MG/3ML) 0.083% IN NEBU
5.0000 mg | INHALATION_SOLUTION | Freq: Once | RESPIRATORY_TRACT | Status: AC
  Administered 2024-09-06: 5 mg via RESPIRATORY_TRACT
  Filled 2024-09-06: qty 6

## 2024-09-06 MED ORDER — CALCITRIOL 0.25 MCG PO CAPS
0.2500 ug | ORAL_CAPSULE | ORAL | Status: DC
  Administered 2024-09-07 – 2024-09-09 (×2): 0.25 ug via ORAL
  Filled 2024-09-06 (×2): qty 1

## 2024-09-06 MED ORDER — AMLODIPINE BESYLATE 5 MG PO TABS
5.0000 mg | ORAL_TABLET | Freq: Every day | ORAL | Status: DC
  Administered 2024-09-07: 5 mg via ORAL
  Filled 2024-09-06: qty 1

## 2024-09-06 MED ORDER — HEPARIN SODIUM (PORCINE) 5000 UNIT/ML IJ SOLN
5000.0000 [IU] | Freq: Three times a day (TID) | INTRAMUSCULAR | Status: DC
  Administered 2024-09-07 – 2024-09-09 (×7): 5000 [IU] via SUBCUTANEOUS
  Filled 2024-09-06 (×7): qty 1

## 2024-09-06 MED ORDER — ACETAMINOPHEN 325 MG PO TABS: 650.0000 mg | ORAL_TABLET | Freq: Four times a day (QID) | ORAL | Status: DC | PRN

## 2024-09-06 MED ORDER — SODIUM BICARBONATE 650 MG PO TABS
1300.0000 mg | ORAL_TABLET | Freq: Two times a day (BID) | ORAL | Status: DC
Start: 2024-09-07 — End: 2024-09-09
  Administered 2024-09-07 – 2024-09-09 (×5): 1300 mg via ORAL
  Filled 2024-09-06 (×5): qty 2

## 2024-09-06 MED ORDER — INSULIN ASPART 100 UNIT/ML IJ SOLN
0.0000 [IU] | Freq: Three times a day (TID) | INTRAMUSCULAR | Status: DC
  Administered 2024-09-07 – 2024-09-08 (×4): 1 [IU] via SUBCUTANEOUS

## 2024-09-06 MED ORDER — FUROSEMIDE 10 MG/ML IJ SOLN
40.0000 mg | Freq: Two times a day (BID) | INTRAMUSCULAR | Status: DC
  Administered 2024-09-07: 40 mg via INTRAVENOUS
  Filled 2024-09-06: qty 4

## 2024-09-06 MED ORDER — IPRATROPIUM BROMIDE 0.02 % IN SOLN
0.5000 mg | Freq: Once | RESPIRATORY_TRACT | Status: AC
  Administered 2024-09-06: 0.5 mg via RESPIRATORY_TRACT
  Filled 2024-09-06: qty 2.5

## 2024-09-06 MED ORDER — TAMOXIFEN CITRATE 10 MG PO TABS
10.0000 mg | ORAL_TABLET | Freq: Every day | ORAL | Status: DC
  Administered 2024-09-07 – 2024-09-09 (×3): 10 mg via ORAL
  Filled 2024-09-06 (×3): qty 1

## 2024-09-06 MED ORDER — ATORVASTATIN CALCIUM 40 MG PO TABS
40.0000 mg | ORAL_TABLET | Freq: Every day | ORAL | Status: DC
  Administered 2024-09-07 – 2024-09-08 (×3): 40 mg via ORAL
  Filled 2024-09-06 (×3): qty 1

## 2024-09-06 MED ORDER — INSULIN ASPART 100 UNIT/ML IJ SOLN
0.0000 [IU] | Freq: Every day | INTRAMUSCULAR | Status: DC
  Administered 2024-09-08: 2 [IU] via SUBCUTANEOUS

## 2024-09-06 MED ORDER — SODIUM CHLORIDE 0.9% FLUSH
3.0000 mL | Freq: Two times a day (BID) | INTRAVENOUS | Status: DC
  Administered 2024-09-07 – 2024-09-09 (×6): 3 mL via INTRAVENOUS

## 2024-09-06 MED ORDER — FUROSEMIDE 10 MG/ML IJ SOLN
40.0000 mg | Freq: Once | INTRAMUSCULAR | Status: AC
  Administered 2024-09-06: 40 mg via INTRAVENOUS
  Filled 2024-09-06: qty 4

## 2024-09-06 MED ORDER — ACETAMINOPHEN 650 MG RE SUPP: 650.0000 mg | Freq: Four times a day (QID) | RECTAL | Status: DC | PRN

## 2024-09-06 MED ORDER — SENNA 8.6 MG PO TABS: 1.0000 | ORAL_TABLET | Freq: Every day | ORAL | Status: DC | PRN

## 2024-09-06 NOTE — ED Triage Notes (Signed)
 Pt BIB EMS from PCP for right ankle injury. During assessment at PCP pt had o2 sats in the 80's on room air. Pt was also hypertensive with last BP 202/100. Pt last took BP medication this morning. Pt denies SOB and is currently on 2L [place] with o2 sats at 100%.

## 2024-09-06 NOTE — ED Provider Triage Note (Signed)
 Emergency Medicine Provider Triage Evaluation Note  Sarah Kline , a 58 y.o. female  was evaluated in triage.  Pt complains of ankle pain.  Patient went to her PCP for ankle pain and swelling in her right ankle.  When she got there, she was noted to be hypoxic to the 80s.  She denies feeling short of breath.  No history of COPD or trouble with her lungs.  They sent her to the ED via EMS.  Review of Systems  Positive:  Negative:   Physical Exam  BP (!) 145/89   Pulse 67   Temp 98.9 F (37.2 C) (Oral)   Resp (!) 24   Ht 5' 5 (1.651 m)   Wt 112 kg   LMP 04/28/2018   SpO2 100%   BMI 41.10 kg/m  Gen:   Awake, no distress   Resp:  Normal effort, lung sounds clear MSK:   Moves extremities without difficulty  Other:  Right leg-mild swelling of the right lower extremity.  No erythema, no crepitus, no warmth.    Medical Decision Making  Medically screening exam initiated at 5:26 PM.  Appropriate orders placed.  Sarah Kline was informed that the remainder of the evaluation will be completed by another provider, this initial triage assessment does not replace that evaluation, and the importance of remaining in the ED until their evaluation is complete.  Patient with some swelling of the right extremity.  She is on 2 L of oxygen.  Consider DVT, consider pulmonary edema, consider pneumonia.  Less likely traumatic injury or soft tissue infection of the right lower extremity.   Mannie Pac T, DO 09/06/24 1729

## 2024-09-06 NOTE — H&P (Signed)
 Sarah Kline

## 2024-09-06 NOTE — ED Provider Notes (Signed)
 Sarah Kline Provider Note   CSN: [id number] Arrival date & time: 09/06/24  1649     Patient presents with: Hypertension, Ankle Pain, and Hypoxia   Sarah Kline is a 58 y.o. female history of diastolic heart failure, hypertension here presenting with shortness of breath and hypoxia and right ankle pain.  Patient states that she twisted her right ankle yesterday.  She states that she noticed worsening ankle swelling and pain.  She went to Sarah Kline who is her primary care doctor.  She states that they saw that she is hypoxic with oxygen in the 80s and sent her in for further evaluation.  Patient states that she has been chronically short of breath.  She states that she is blind and unable to see very well and does not know what her baseline oxygen level is.  She was also noted to be hypertensive per EMS.   The history is provided by the patient.       Prior to Admission medications   Medication Sig Start Date End Date Taking? Authorizing Provider  amLODipine  (NORVASC ) 5 MG tablet Take 1 tablet (5 mg total) by mouth daily. 02/28/23 03/30/23  Sarah Gee, MD  atorvastatin  (LIPITOR ) 40 MG tablet Take 40 mg by mouth at bedtime.    [provider]  Blood Glucose Monitoring Suppl (PRODIGY AUTOCODE BLOOD GLUCOSE) w/Device KIT 1 kit by Does not apply route as directed.    [provider]  calcitRIOL  (ROCALTROL ) 0.25 MCG capsule TAKE 1 CAPSULE EVERY DAY Patient taking differently: Take 0.25 mcg by mouth every other day. 08/26/21   Kline, Rockie, MD  furosemide  (LASIX ) 40 MG tablet Take 1 tablet (40 mg total) by mouth daily. 03/02/23   Sarah Gee, MD  iron  polysaccharides (NIFEREX) 150 MG capsule Take 1 capsule (150 mg total) by mouth daily. 02/27/23 03/29/23  Sarah Gee, MD  sodium bicarbonate  650 MG tablet Take 1,300 mg by mouth 2 (two) times daily. 06/02/18   [provider]  tamoxifen  (NOLVADEX ) 10  MG tablet TAKE 1 TABLET EVERY DAY 02/29/24   Sarah Potts, MD    Allergies: Patient has no known allergies.    Review of Systems  Respiratory:  Positive for shortness of breath.   Musculoskeletal:        Right ankle pain  All other systems reviewed and are negative.   Updated Vital Signs BP (!) 161/80   Pulse 71   Temp 98.9 F (37.2 C) (Oral)   Resp (!) 21   Ht 5' 5 (1.651 m)   Wt 112 kg   LMP 04/28/2018   SpO2 99%   BMI 41.10 kg/m   Physical Exam Vitals reviewed.  Constitutional:      Appearance: Normal appearance.  HENT:     Head: Normocephalic.     Right Ear: Tympanic membrane normal.     Left Ear: Tympanic membrane normal.     Nose: Nose normal.     Mouth/Throat:     Mouth: Mucous membranes are moist.  Eyes:     Extraocular Movements: Extraocular movements intact.     Pupils: Pupils are equal, round, and reactive to light.  Cardiovascular:     Rate and Rhythm: Normal rate and regular rhythm.     Pulses: Normal pulses.  Pulmonary:     Comments: Crackles bilateral bases, slightly tachypneic Abdominal:     General: Abdomen is flat.     Palpations: Abdomen is soft.  Musculoskeletal:  Cervical back: Normal range of motion and neck supple.     Comments: Trace edema bilaterally.  Patient does have right ankle swelling and tenderness  Skin:    General: Skin is warm.     Capillary Refill: Capillary refill takes less than 2 seconds.  Neurological:     General: No focal deficit present.     Mental Status: She is alert and oriented to person, place, and time.  Psychiatric:        Mood and Affect: Mood normal.        Behavior: Behavior normal.     (all labs ordered are listed, but only abnormal results are displayed) Labs Reviewed  COMPREHENSIVE METABOLIC PANEL WITH GFR - Abnormal; Notable for the following components:      Result Value   Chloride 114 (*)    CO2 21 (*)    Glucose, Bld 114 (*)    BUN 76 (*)    Creatinine, Ser 6.58 (*)    Calcium  8.8  (*)    Total Protein 6.0 (*)    Albumin 3.2 (*)    Total Bilirubin 1.3 (*)    GFR, Estimated 7 (*)    All other components within normal limits  CBC WITH DIFFERENTIAL/PLATELET - Abnormal; Notable for the following components:   RBC 3.14 (*)    Hemoglobin 9.3 (*)    HCT 30.8 (*)    All other components within normal limits  BRAIN NATRIURETIC PEPTIDE - Abnormal; Notable for the following components:   B Natriuretic Peptide 672.2 (*)    All other components within normal limits  TROPONIN I (HIGH SENSITIVITY) - Abnormal; Notable for the following components:   Troponin I (High Sensitivity) 59 (*)    All other components within normal limits  TROPONIN I (HIGH SENSITIVITY)    EKG: EKG Interpretation Date/Time:  Tuesday September 06 2024 17:11:18 EDT Ventricular Rate:  72 PR Interval:  154 QRS Duration:  62 QT Interval:  402 QTC Calculation: 440 R Axis:   68  Text Interpretation: Sinus rhythm with marked sinus arrhythmia Low voltage QRS Cannot rule out Anterior infarct , age undetermined Abnormal ECG When compared with ECG of 21-Feb-2023 08:05, No significant change since last tracing Confirmed by Sarah Sarah DEL ([id number]) on 09/06/2024 6:30:54 PM  Radiology: [facility] Ankle Complete Right Result Date: 09/06/2024 CLINICAL DATA:  Right ankle pain of unknown etiology. EXAM: RIGHT ANKLE - COMPLETE 3+ VIEW COMPARISON:  No prior comparison studies are available. FINDINGS: No evidence of acute fracture or dislocation. No focal bone lesion or bone destruction. Joint spaces are normal. Plantar and Achilles calcaneal spurs. Degenerative changes in the visualized intertarsal joints. Diffuse soft tissue swelling with infiltration in the subcutaneous fat likely representing edema. No radiopaque soft tissue foreign bodies or soft tissue gas. IMPRESSION: No acute bony abnormalities.  Diffuse soft tissue edema suggested. Electronically Signed   By: Elsie Gravely M.D.   On: 09/06/2024 19:04   DG Chest 1  View Result Date: 09/06/2024 CLINICAL DATA:  Cough EXAM: CHEST  1 VIEW COMPARISON:  02/22/2023 FINDINGS: Shallow inspiration. Cardiac enlargement with pulmonary vascular congestion and mild perihilar edema, progressing since prior study. No pleural effusion or pneumothorax. Mediastinal contours appear intact. Degenerative changes in the spine and shoulders. IMPRESSION: Cardiac enlargement with pulmonary vascular congestion and perihilar edema demonstrating progression since prior study. Electronically Signed   By: Elsie Gravely M.D.   On: 09/06/2024 19:02     Procedures   CRITICAL CARE Performed by: Sarah DEL  Sarah Kline   Total critical care time: 39 minutes  Critical care time was exclusive of separately billable procedures and treating other patients.  Critical care was necessary to treat or prevent imminent or life-threatening deterioration.  Critical care was time spent personally by me on the following activities: development of treatment plan with patient and/or surrogate as well as nursing, discussions with consultants, evaluation of patient's response to treatment, examination of patient, obtaining history from patient or surrogate, ordering and performing treatments and interventions, ordering and review of laboratory studies, ordering and review of radiographic studies, pulse oximetry and re-evaluation of patient's condition.   Medications Ordered in the ED  furosemide  (LASIX ) injection 40 mg (has no administration in time range)  albuterol  (PROVENTIL ) (2.5 MG/3ML) 0.083% nebulizer solution 5 mg (5 mg Nebulization Given 09/06/24 2032)  ipratropium (ATROVENT) nebulizer solution 0.5 mg (0.5 mg Nebulization Given 09/06/24 2032)                                    Medical Decision Making LOREY PALLETT is a 58 y.o. female here presenting with shortness of breath and hypoxia and right ankle pain and swelling.  Consider ankle fracture versus heart failure versus COPD.  Plan to get CBC  and BMP and BNP and chest x-ray and troponin.  9:20 PM Reviewed patient's labs and creatinine is 6.8 which is similar to baseline.  Patient's BNP increased to 670 and troponin is 59.  Chest x-ray showed vascular congestion and ankle x-ray was unremarkable.  At this point patient will be admitted for CHF and CKD.  Ordered IV Lasix    Problems Addressed: Acute on chronic diastolic congestive heart failure (HCC): chronic illness or injury Chronic kidney disease, unspecified CKD stage: acute illness or injury  Amount and/or Complexity of Data Reviewed Radiology: ordered and independent interpretation performed. Decision-making details documented in ED Course.  Risk Prescription drug management.     Final diagnoses:  None    ED Discharge Orders     None          Sarah Sarah Macho, MD 09/06/24 2121

## 2024-09-07 ENCOUNTER — Inpatient Hospital Stay (HOSPITAL_COMMUNITY)

## 2024-09-07 ENCOUNTER — Encounter (HOSPITAL_COMMUNITY): Payer: Self-pay | Admitting: Family Medicine

## 2024-09-07 DIAGNOSIS — I5031 Acute diastolic (congestive) heart failure: Secondary | ICD-10-CM | POA: Diagnosis not present

## 2024-09-07 DIAGNOSIS — N289 Disorder of kidney and ureter, unspecified: Secondary | ICD-10-CM

## 2024-09-07 DIAGNOSIS — J810 Acute pulmonary edema: Secondary | ICD-10-CM

## 2024-09-07 DIAGNOSIS — E8770 Fluid overload, unspecified: Secondary | ICD-10-CM | POA: Diagnosis not present

## 2024-09-07 DIAGNOSIS — J9601 Acute respiratory failure with hypoxia: Secondary | ICD-10-CM | POA: Diagnosis not present

## 2024-09-07 DIAGNOSIS — D631 Anemia in chronic kidney disease: Secondary | ICD-10-CM

## 2024-09-07 DIAGNOSIS — N179 Acute kidney failure, unspecified: Secondary | ICD-10-CM | POA: Diagnosis not present

## 2024-09-07 DIAGNOSIS — E66813 Obesity, class 3: Secondary | ICD-10-CM | POA: Insufficient documentation

## 2024-09-07 LAB — BASIC METABOLIC PANEL WITH GFR
Anion gap: 11 (ref 5–15)
BUN: 75 mg/dL — ABNORMAL HIGH (ref 6–20)
CO2: 21 mmol/L — ABNORMAL LOW (ref 22–32)
Calcium: 8.5 mg/dL — ABNORMAL LOW (ref 8.9–10.3)
Chloride: 108 mmol/L (ref 98–111)
Creatinine, Ser: 6.45 mg/dL — ABNORMAL HIGH (ref 0.44–1.00)
GFR, Estimated: 7 mL/min — ABNORMAL LOW (ref >60)
Glucose, Bld: 215 mg/dL — ABNORMAL HIGH (ref 70–99)
Potassium: 3.8 mmol/L (ref 3.5–5.1)
Sodium: 140 mmol/L (ref 135–145)

## 2024-09-07 LAB — GLUCOSE, CAPILLARY
Glucose-Capillary: 166 mg/dL — ABNORMAL HIGH (ref 70–99)
Glucose-Capillary: 174 mg/dL — ABNORMAL HIGH (ref 70–99)

## 2024-09-07 LAB — ECHOCARDIOGRAM COMPLETE
AR max vel: 1.66 cm2
AV Area VTI: 1.72 cm2
AV Area mean vel: 1.74 cm2
AV Mean grad: 8 mmHg
AV Peak grad: 14.1 mmHg
Ao pk vel: 1.88 m/s
Area-P 1/2: 2.5 cm2
Height: 65 in
S' Lateral: 2.2 cm
Weight: 3952 [oz_av]

## 2024-09-07 LAB — IRON AND TIBC
Iron: 23 ug/dL — ABNORMAL LOW (ref 28–170)
Saturation Ratios: 7 % — ABNORMAL LOW (ref 10.4–31.8)
TIBC: 309 ug/dL (ref 250–450)
UIBC: 286 ug/dL

## 2024-09-07 LAB — CBC
HCT: 25.9 % — ABNORMAL LOW (ref 36.0–46.0)
Hemoglobin: 8.2 g/dL — ABNORMAL LOW (ref 12.0–15.0)
MCH: 29.9 pg (ref 26.0–34.0)
MCHC: 31.7 g/dL (ref 30.0–36.0)
MCV: 94.5 fL (ref 80.0–100.0)
Platelets: 190 K/uL (ref 150–400)
RBC: 2.74 MIL/uL — ABNORMAL LOW (ref 3.87–5.11)
RDW: 14.4 % (ref 11.5–15.5)
WBC: 6.6 K/uL (ref 4.0–10.5)
nRBC: 0 % (ref 0.0–0.2)

## 2024-09-07 LAB — URINALYSIS, COMPLETE (UACMP) WITH MICROSCOPIC
Bilirubin Urine: NEGATIVE
Glucose, UA: 50 mg/dL — AB
Hgb urine dipstick: NEGATIVE
Ketones, ur: NEGATIVE mg/dL
Leukocytes,Ua: NEGATIVE
Nitrite: NEGATIVE
Protein, ur: 100 mg/dL — AB
Specific Gravity, Urine: 1.008 (ref 1.005–1.030)
pH: 8 (ref 5.0–8.0)

## 2024-09-07 LAB — FERRITIN: Ferritin: 96 ng/mL (ref 11–307)

## 2024-09-07 LAB — CREATININE, URINE, RANDOM: Creatinine, Urine: 37 mg/dL

## 2024-09-07 LAB — VITAMIN B12: Vitamin B-12: 423 pg/mL (ref 180–914)

## 2024-09-07 LAB — RETICULOCYTES
Immature Retic Fract: 18.7 % — ABNORMAL HIGH (ref 2.3–15.9)
RBC.: 2.77 MIL/uL — ABNORMAL LOW (ref 3.87–5.11)
Retic Count, Absolute: 79.2 K/uL (ref 19.0–186.0)
Retic Ct Pct: 2.9 % (ref 0.4–3.1)

## 2024-09-07 LAB — CBG MONITORING, ED
Glucose-Capillary: 133 mg/dL — ABNORMAL HIGH (ref 70–99)
Glucose-Capillary: 161 mg/dL — ABNORMAL HIGH (ref 70–99)
Glucose-Capillary: 169 mg/dL — ABNORMAL HIGH (ref 70–99)

## 2024-09-07 LAB — FOLATE: Folate: 11.9 ng/mL (ref >5.9)

## 2024-09-07 LAB — HIV ANTIBODY (ROUTINE TESTING W REFLEX): HIV Screen 4th Generation wRfx: NONREACTIVE

## 2024-09-07 LAB — MAGNESIUM: Magnesium: 2 mg/dL (ref 1.7–2.4)

## 2024-09-07 LAB — SODIUM, URINE, RANDOM: Sodium, Ur: 121 mmol/L

## 2024-09-07 LAB — PHOSPHORUS: Phosphorus: 4.5 mg/dL (ref 2.5–4.6)

## 2024-09-07 LAB — HEMOGLOBIN A1C
Hgb A1c MFr Bld: 5.9 % — ABNORMAL HIGH (ref 4.8–5.6)
Mean Plasma Glucose: 122.63 mg/dL

## 2024-09-07 MED ORDER — FUROSEMIDE 10 MG/ML IJ SOLN
60.0000 mg | Freq: Two times a day (BID) | INTRAMUSCULAR | Status: DC
  Administered 2024-09-07 – 2024-09-09 (×4): 60 mg via INTRAVENOUS
  Filled 2024-09-07 (×4): qty 6

## 2024-09-07 MED ORDER — FUROSEMIDE 10 MG/ML PO SOLN
40.0000 mg | Freq: Once | ORAL | Status: DC
  Filled 2024-09-07: qty 4

## 2024-09-07 MED ORDER — FENTANYL CITRATE PF 50 MCG/ML IJ SOSY: 25.0000 ug | PREFILLED_SYRINGE | INTRAMUSCULAR | Status: DC | PRN

## 2024-09-07 MED ORDER — CARVEDILOL 12.5 MG PO TABS
12.5000 mg | ORAL_TABLET | Freq: Two times a day (BID) | ORAL | Status: DC
  Administered 2024-09-07 – 2024-09-09 (×4): 12.5 mg via ORAL
  Filled 2024-09-07 (×4): qty 1

## 2024-09-07 MED ORDER — IRON SUCROSE 200 MG IVPB - SIMPLE MED
200.0000 mg | Status: DC
  Administered 2024-09-07: 200 mg via INTRAVENOUS
  Filled 2024-09-07: qty 200
  Filled 2024-09-07: qty 110

## 2024-09-07 MED ORDER — AMLODIPINE BESYLATE 5 MG PO TABS
5.0000 mg | ORAL_TABLET | Freq: Once | ORAL | Status: AC
  Administered 2024-09-07: 5 mg via ORAL
  Filled 2024-09-07: qty 1

## 2024-09-07 MED ORDER — FUROSEMIDE 10 MG/ML PO SOLN
60.0000 mg | Freq: Two times a day (BID) | ORAL | Status: DC
Start: 2024-09-07 — End: 2024-09-07

## 2024-09-07 MED ORDER — PERFLUTREN LIPID MICROSPHERE
1.0000 mL | INTRAVENOUS | Status: AC | PRN
  Administered 2024-09-07: 4 mL via INTRAVENOUS

## 2024-09-07 MED ORDER — OXYCODONE HCL 5 MG PO TABS: 5.0000 mg | ORAL_TABLET | Freq: Four times a day (QID) | ORAL | Status: DC | PRN

## 2024-09-07 MED ORDER — AMLODIPINE BESYLATE 10 MG PO TABS
10.0000 mg | ORAL_TABLET | Freq: Every day | ORAL | Status: DC
  Administered 2024-09-08 – 2024-09-09 (×2): 10 mg via ORAL
  Filled 2024-09-07 (×2): qty 1

## 2024-09-07 MED ORDER — FUROSEMIDE 10 MG/ML PO SOLN
60.0000 mg | Freq: Two times a day (BID) | ORAL | Status: DC
  Filled 2024-09-07: qty 6

## 2024-09-07 NOTE — Assessment & Plan Note (Signed)
Body mass index is 42.53 kg/m.

## 2024-09-07 NOTE — Progress Notes (Signed)
 PROGRESS NOTE    BRAYDEE SHIMKUS  FMW:[id number] DOB: [birth year] DOA: 09/06/2024 PCP: Campbell Reynolds, NP  Subjective: Pt seen and examined. Met with pt and dtr leslie at bedside. Pt is breathing better. LE edema has resolved. Nephrology consulted this AM.   Hospital Course: CC: right ankle injury, SOB  HPI:  Sarah Kline is a 58 y.o. female with medical history significant for hypertension, type 2 diabetes mellitus, hyperlipidemia, breast cancer, and CKD stage V not yet on dialysis who presents with right ankle pain and swelling, shortness of breath, and hypoxia.   Patient reports that she twisted her ankle today and has had right ankle pain and swelling.  She initially sought evaluation for this in an outpatient clinic where she was noted to be hypoxic and therefore directed to the ED.  Patient reports insidiously worsening dyspnea over the course of days to weeks but denies any significant cough, denies fever or chills, and denies any leg swelling until the right ankle injury today.  Patient states that she has a fistula in her right upper extremity that is ready for use, and notes that her nephrologist, Dr. Marlee, recently told her that her kidney function was worsening.   ED Course: Upon arrival to the ED, patient is found to be afebrile and saturating well on 2 L/min of supplemental oxygen with mild tachypnea, normal HR, and elevated BP.  Labs are most notable for BUN 76, creatinine 6.58, normal WBC, hemoglobin 9.3, and BNP 672.  Plain radiographs of the right ankle are negative for acute fracture or dislocation.  Chest x-ray is concerning for cardiomegaly and pulmonary vascular congestion with perihilar edema.   Patient was treated in the ED with IV Lasix , albuterol , and Atrovent.  Significant Events: Admitted 09/06/2024 for acute on chronic diastolic CHF, acute hypoxic respiratory failure, AKI on CKD stage 5   Admission Labs: WBC 7.8, HgB 9,3 plt 198 Na 143, K 4.8, CO2 of  21, BUN 76, Scr 6.58 T. Prot 6.0, alb 3.2, AST 21, ALT 28, alk phos 71, T. Bili 1.3 BNP 672 Fe 23, TIBC 309, %sat 7, ferritin 96 Vit B12 of 423  Admission Imaging Studies: CXR Cardiac enlargement with pulmonary vascular congestion and perihilar edema demonstrating progression since prior study. Right ankle XR No acute bony abnormalities. Diffuse soft tissue edema suggested.   Significant Labs:   Significant Imaging Studies: Echo LVEF 65-70%  Antibiotic Therapy: Anti-infectives (From admission, onward)    None       Procedures:   Consultants: nephrology    Assessment and Plan: * Hypervolemia associated with renal insufficiency 09/07/24 nephrology consulted to assist with management of intravascular volume. Pt still urinates. Defer to nephrology to dose her IV lasix  given acute renal failure on CKD stage 5. Pt has AVF that was placed in 2023.   Acute respiratory failure with hypoxia (HCC) 09/07/24 pt not on home O2. Due to acute pulmonary edema from hypervolemia. Wean to RA as tolerated.   Acute pulmonary edema (HCC) 09/07/24 due to hypervolemia from CKD.   Acute renal failure superimposed on stage 5 chronic kidney disease, not on chronic dialysis (HCC) 09/07/24 unclear the cause of her ARF. Pt is not uremic. She is still urinating. Hopefully she can avoid having to start HD now. Nephrology consulted.   Chronic diastolic CHF (congestive heart failure) (HCC) 09/07/24 chronic. LE edema has improved with diuresis.   Anemia of chronic renal failure, stage 5 (HCC) 09/07/24 management per nephrology   Legally blind 09/07/24  chronic.   Essential hypertension 09/07/24 on norvasc . Restart coreg . LVEF 65%   Diabetes mellitus with hyperglycemia (HCC) 09/07/24 on SSI.   Obesity, Class III, BMI 40-49.9 (morbid obesity) (HCC) Body mass index is 42.53 kg/m.   DVT prophylaxis: heparin  injection 5,000 Units Start: 09/07/24 1400    Code Status: Full Code Family  Communication: discussed with pt and dtr leslie at bedside Disposition Plan: return home Reason for continuing need for hospitalization: remains on supplemental O2. Nephrology dosing IV lasix .  Objective: Vitals:   09/07/24 1445 09/07/24 1524 09/07/24 1614 09/07/24 1700  BP: (!) 166/125 (!) 166/70 (!) 197/71 (!) 186/75  Pulse: 68 75 72 67  Resp: (!) 22 (!) 22 15 20   Temp:  99.3 F (37.4 C)  98.8 F (37.1 C)  TempSrc:  Oral  Oral  SpO2: 99% 96% 100% 100%  Weight:   112.4 kg 112.4 kg  Height:   5' 4 (1.626 m) 5' 4 (1.626 m)    Intake/Output Summary (Last 24 hours) at 09/07/2024 1900 Last data filed at 09/07/2024 1731 Gross per 24 hour  Intake 88.36 ml  Output --  Net 88.36 ml   Filed Weights   09/06/24 1701 09/07/24 1614 09/07/24 1700  Weight: 112 kg 112.4 kg 112.4 kg    Examination:  Physical Exam Vitals and nursing note reviewed.  Constitutional:      Appearance: She is obese.  HENT:     Head: Normocephalic and atraumatic.  Eyes:     General: No scleral icterus. Cardiovascular:     Rate and Rhythm: Normal rate and regular rhythm.  Pulmonary:     Effort: Pulmonary effort is normal.     Breath sounds: Examination of the right-lower field reveals rales. Examination of the left-lower field reveals rales. Rales present.  Abdominal:     General: Bowel sounds are normal. There is no distension.     Palpations: Abdomen is soft.  Musculoskeletal:     Right lower leg: No edema.     Left lower leg: No edema.  Skin:    General: Skin is warm and dry.     Capillary Refill: Capillary refill takes less than 2 seconds.  Neurological:     Mental Status: She is oriented to person, place, and time.     Comments: Pt is blind.     Data Reviewed: I have personally reviewed following labs and imaging studies  CBC: Recent Labs  Lab 09/06/24 2014 09/07/24 0431  WBC 7.8 6.6  NEUTROABS 6.0  --   HGB 9.3* 8.2*  HCT 30.8* 25.9*  MCV 98.1 94.5  PLT 198 190   Basic  Metabolic Panel: Recent Labs  Lab 09/06/24 2014 09/07/24 0431  NA 143 140  K 4.8 3.8  CL 114* 108  CO2 21* 21*  GLUCOSE 114* 215*  BUN 76* 75*  CREATININE 6.58* 6.45*  CALCIUM  8.8* 8.5*  MG  --  2.0  PHOS  --  4.5   GFR: Estimated Creatinine Clearance: 11.5 mL/min (A) (by C-G formula based on SCr of 6.45 mg/dL (H)). Liver Function Tests: Recent Labs  Lab 09/06/24 2014  AST 21  ALT 28  ALKPHOS 71  BILITOT 1.3*  PROT 6.0*  ALBUMIN 3.2*   BNP (last 3 results) Recent Labs    09/06/24 2014  BNP 672.2*   HbA1C: Recent Labs    09/07/24 0431  HGBA1C 5.9*   CBG: Recent Labs  Lab 09/07/24 0025 09/07/24 0802 09/07/24 1228  GLUCAP 133* 161* 169*  Anemia Panel: Recent Labs    09/07/24 0431  VITAMINB12 423  FOLATE 11.9  FERRITIN 96  TIBC 309  IRON  23*  RETICCTPCT 2.9    Radiology Studies: ECHOCARDIOGRAM COMPLETE Result Date: 09/07/2024    ECHOCARDIOGRAM REPORT   Patient Name:   CLAUDEEN LEASON Date of Exam: 09/07/2024 Medical Rec #:  [id number]          Height:       65.0 in Accession #:    [id number]         Weight:       247.0 lb Date of Birth:  November 20, 1966         BSA:          2.164 m Patient Age:    57 years           BP:           189/55 mmHg Patient Gender: F                  HR:           66 bpm. Exam Location:  Inpatient Procedure: 2D Echo, Cardiac Doppler, Color Doppler and Intracardiac            Opacification Agent (Both Spectral and Color Flow Doppler were            utilized during procedure). Indications:    I50.31 Acute diastolic (congestive) heart failure  History:        Patient has prior history of Echocardiogram examinations, most                 recent 02/22/2023. CKD V; Risk Factors:Hypertension, Diabetes and                 Dyslipidemia.  Sonographer:    Damien Senior RDCS Referring Phys: [id number] TIMOTHY S OPYD  Sonographer Comments: Technically difficult study due to patient body habitus IMPRESSIONS  1. Left ventricular ejection fraction, by  estimation, is 65 to 70%. The left ventricle has normal function. The left ventricle has no regional wall motion abnormalities. There is moderate concentric left ventricular hypertrophy. Left ventricular diastolic parameters are consistent with Grade I diastolic dysfunction (impaired relaxation).  2. Right ventricular systolic function is normal. The right ventricular size is not well visualized. Tricuspid regurgitation signal is inadequate for assessing PA pressure.  3. Left atrial size was mildly dilated.  4. The mitral valve is grossly normal. No evidence of mitral valve regurgitation.  5. The aortic valve has an indeterminant number of cusps. Aortic valve regurgitation is trivial.  6. The inferior vena cava is normal in size with <50% respiratory variability, suggesting right atrial pressure of 8 mmHg. Comparison(s): A prior study was performed on 02/22/2023. There was grade 2 diastolic dysfunction with elevated LVEDP. NO other significant change. FINDINGS  Left Ventricle: Left ventricular ejection fraction, by estimation, is 65 to 70%. The left ventricle has normal function. The left ventricle has no regional wall motion abnormalities. Definity contrast agent was given IV to delineate the left ventricular  endocardial borders. The left ventricular internal cavity size was normal in size. There is moderate concentric left ventricular hypertrophy. Left ventricular diastolic parameters are consistent with Grade I diastolic dysfunction (impaired relaxation). Normal left ventricular filling pressure. Right Ventricle: The right ventricular size is not well visualized. Right vetricular wall thickness was not well visualized. Right ventricular systolic function is normal. Tricuspid regurgitation signal is inadequate for assessing PA pressure. Left Atrium: Left atrial size was  mildly dilated. Right Atrium: Right atrial size was not well visualized. Pericardium: Trivial pericardial effusion is present. Mitral Valve: The  mitral valve is grossly normal. No evidence of mitral valve regurgitation. Tricuspid Valve: The tricuspid valve is grossly normal. Tricuspid valve regurgitation is trivial. Aortic Valve: The aortic valve has an indeterminant number of cusps. Aortic valve regurgitation is trivial. Aortic valve mean gradient measures 8.0 mmHg. Aortic valve peak gradient measures 14.1 mmHg. Aortic valve area, by VTI measures 1.72 cm. Pulmonic Valve: The pulmonic valve was grossly normal. Pulmonic valve regurgitation is trivial. Aorta: The aortic root and ascending aorta are structurally normal, with no evidence of dilitation. Venous: The inferior vena cava is normal in size with less than 50% respiratory variability, suggesting right atrial pressure of 8 mmHg. IAS/Shunts: The atrial septum is grossly normal.  LEFT VENTRICLE PLAX 2D LVIDd:         3.40 cm   Diastology LVIDs:         2.20 cm   LV e' medial:    5.33 cm/s LV PW:         1.60 cm   LV E/e' medial:  18.1 LV IVS:        1.50 cm   LV e' lateral:   5.66 cm/s LVOT diam:     1.90 cm   LV E/e' lateral: 17.0 LV SV:         75 LV SV Index:   35 LVOT Area:     2.84 cm LV IVRT:       120 msec  RIGHT VENTRICLE RV S prime:     11.70 cm/s TAPSE (M-mode): 3.2 cm LEFT ATRIUM             Index LA diam:        4.20 cm 1.94 cm/m LA Vol (A2C):   58.8 ml 27.17 ml/m LA Vol (A4C):   59.0 ml 27.27 ml/m LA Biplane Vol: 59.2 ml 27.36 ml/m  AORTIC VALVE AV Area (Vmax):    1.66 cm AV Area (Vmean):   1.74 cm AV Area (VTI):     1.72 cm AV Vmax:           188.00 cm/s AV Vmean:          139.000 cm/s AV VTI:            0.439 m AV Peak Grad:      14.1 mmHg AV Mean Grad:      8.0 mmHg LVOT Vmax:         110.00 cm/s LVOT Vmean:        85.400 cm/s LVOT VTI:          0.266 m LVOT/AV VTI ratio: 0.61  AORTA Ao Root diam: 2.70 cm Ao Asc diam:  3.60 cm MITRAL VALVE MV Area (PHT): 2.50 cm     SHUNTS MV Decel Time: 303 msec     Systemic VTI:  0.27 m MV E velocity: 96.30 cm/s   Systemic Diam: 1.90 cm MV A  velocity: 129.00 cm/s MV E/A ratio:  0.75 Emeline Calender Electronically signed by Emeline Calender Signature Date/Time: 09/07/2024/4:54:06 PM    Final    DG Ankle Complete Right Result Date: 09/06/2024 CLINICAL DATA:  Right ankle pain of unknown etiology. EXAM: RIGHT ANKLE - COMPLETE 3+ VIEW COMPARISON:  No prior comparison studies are available. FINDINGS: No evidence of acute fracture or dislocation. No focal bone lesion or bone destruction. Joint spaces are normal. Plantar and Achilles calcaneal spurs.  Degenerative changes in the visualized intertarsal joints. Diffuse soft tissue swelling with infiltration in the subcutaneous fat likely representing edema. No radiopaque soft tissue foreign bodies or soft tissue gas. IMPRESSION: No acute bony abnormalities.  Diffuse soft tissue edema suggested. Electronically Signed   By: Elsie Gravely M.D.   On: 09/06/2024 19:04   DG Chest 1 View Result Date: 09/06/2024 CLINICAL DATA:  Cough EXAM: CHEST  1 VIEW COMPARISON:  02/22/2023 FINDINGS: Shallow inspiration. Cardiac enlargement with pulmonary vascular congestion and mild perihilar edema, progressing since prior study. No pleural effusion or pneumothorax. Mediastinal contours appear intact. Degenerative changes in the spine and shoulders. IMPRESSION: Cardiac enlargement with pulmonary vascular congestion and perihilar edema demonstrating progression since prior study. Electronically Signed   By: Elsie Gravely M.D.   On: 09/06/2024 19:02    Scheduled Meds:  [START ON 09/08/2024] amLODipine   10 mg Oral Daily   atorvastatin   40 mg Oral QHS   calcitRIOL   0.25 mcg Oral QODAY   carvedilol   12.5 mg Oral BID   furosemide   60 mg Intravenous BID   heparin   5,000 Units Subcutaneous Q8H   insulin  aspart  0-5 Units Subcutaneous QHS   insulin  aspart  0-6 Units Subcutaneous TID WC   sodium bicarbonate   1,300 mg Oral BID   sodium chloride  flush  3 mL Intravenous Q12H   tamoxifen   10 mg Oral Daily   Continuous Infusions:   iron  sucrose Stopped (09/07/24 1550)     LOS: 1 day   Time spent: 60 minutes  Camellia Door, DO  [facility]  09/07/2024, 7:00 PM

## 2024-09-07 NOTE — Progress Notes (Signed)
 Echocardiogram 2D Echocardiogram has been performed.  [name] [name] RDCS 09/07/2024, 1:58 PM

## 2024-09-07 NOTE — Subjective & Objective (Signed)
 Pt seen and examined. Met with pt and dtr leslie at bedside. Pt is breathing better. LE edema has resolved. Nephrology consulted this AM.

## 2024-09-07 NOTE — Progress Notes (Signed)
 Echo attempted at 7:50, patient not available. Will retry as schedule permits.  [facility] Destini Cambre RDCS

## 2024-09-07 NOTE — ED Notes (Signed)
 Pt saturated in urine. Peri care performed. New gown, sheets, chucks, and diaper applied. Purwick in place.

## 2024-09-07 NOTE — Assessment & Plan Note (Addendum)
 09/07/24 chronic. LE edema has improved with diuresis.  09/08/24 continue with IV lasix  60 mg q12h per nephrology.

## 2024-09-07 NOTE — Assessment & Plan Note (Signed)
09/07/24  

## 2024-09-07 NOTE — Hospital Course (Addendum)
 CC: right ankle injury, SOB  HPI:  Sarah Kline is a 58 y.o. 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   Patient reported twisting her ankle on the day of admission, now painful and swollen. Was evaluated in clinic where she was noted to be hypoxic and sent to the ED. she admitted to insidiously worsening dyspnea over the course of days to weeks but denies any significant cough, fevers, chills, or pedal edema until the injury to her right ankle.  She has undergone placement of a RUE fistula and that has matured.  She follows with Dr. Marlee with nephrology.   ED Course: In the ED, she was found to be afebrile, maintaining appropriate SpO2 on 2 L nasal cannula, mildly tachypneic, normal HR, elevated BP.  Labs were notable for BUN 76, creatinine 6.58, normal WBC, Hgb 9.3, and BNP 672.  X-ray of right ankle was negative for acute fracture or dislocation.  CXR was concerning for cardiomegaly and pulmonary vascular congestion with perihilar edema.   Patient was treated in the ED with IV Lasix , albuterol , and Atrovent.  Significant Events: Admitted 09/06/2024 for acute on chronic diastolic CHF, acute hypoxic respiratory failure, AKI on CKD stage 5  Admission Labs: WBC 7.8, HgB 9.3 plt 198 Na 143, K 4.8, CO2 of 21, BUN 76, Scr 6.58 T. Prot 6.0, alb 3.2, AST 21, ALT 28, alk phos 71, T. Bili 1.3 BNP 672 Fe 23, TIBC 309, %sat 7, ferritin 96 Vit B12 of 423

## 2024-09-07 NOTE — Progress Notes (Addendum)
 Patient arrived from Ed  @ 1700 in stable condition. O2 at 3L, bath done, gown changed and placed on telemetry. All needs addressed. Call light within reach, daughter at bedside.

## 2024-09-07 NOTE — Assessment & Plan Note (Addendum)
 09/07/24 management per nephrology

## 2024-09-07 NOTE — Plan of Care (Signed)
  Problem: Coping: Goal: Ability to adjust to condition or change in health will improve Outcome: Progressing   Problem: Metabolic: Goal: Ability to maintain appropriate glucose levels will improve Outcome: Progressing   Problem: Nutritional: Goal: Maintenance of adequate nutrition will improve Outcome: Progressing   Problem: Tissue Perfusion: Goal: Adequacy of tissue perfusion will improve Outcome: Progressing   Problem: Education: Goal: Knowledge of General Education information will improve Description: Including pain rating scale, medication(s)/side effects and non-pharmacologic comfort measures Outcome: Progressing   Problem: Clinical Measurements: Goal: Ability to maintain clinical measurements within normal limits will improve Outcome: Progressing Goal: Will remain free from infection Outcome: Progressing Goal: Diagnostic test results will improve Outcome: Progressing Goal: Respiratory complications will improve Outcome: Progressing Goal: Cardiovascular complication will be avoided Outcome: Progressing   Problem: Activity: Goal: Risk for activity intolerance will decrease Outcome: Progressing   Problem: Nutrition: Goal: Adequate nutrition will be maintained Outcome: Progressing   Problem: Elimination: Goal: Will not experience complications related to bowel motility Outcome: Progressing Goal: Will not experience complications related to urinary retention Outcome: Progressing   Problem: Pain Managment: Goal: General experience of comfort will improve and/or be controlled Outcome: Progressing   Problem: Safety: Goal: Ability to remain free from injury will improve Outcome: Progressing   Problem: Education: Goal: Ability to demonstrate management of disease process will improve Outcome: Progressing   Problem: Activity: Goal: Capacity to carry out activities will improve Outcome: Progressing   Problem: Cardiac: Goal: Ability to achieve and maintain  adequate cardiopulmonary perfusion will improve Outcome: Progressing

## 2024-09-07 NOTE — Assessment & Plan Note (Addendum)
 09/07/24 pt not on home O2. Due to acute pulmonary edema from hypervolemia. Wean to RA as tolerated.  09/08/24 remains on supplemental O2. RA sats dropped to 83% when getting out of bed today.

## 2024-09-07 NOTE — ED Notes (Signed)
 Care of pt assumed from previous RN. Pt states she had just wet the bed, purewick not connected. Linens and gown changed, pericare performed. Due medications administered, pt connected to continuous monitoring. Pt in NAD, noted to have expiratory wheezes with movement, pt able to stand and pivot to bedside recliner for bed change. Pt safely returned to stretcher, remains on 3L via [place].  Spouse at bedside, call bell in reach, VSS.

## 2024-09-07 NOTE — Consult Note (Addendum)
 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  The patient reports twisting her right ankle today, resulting in pain and swelling. She initially sought care at an outpatient clinic, where she was noted to be hypoxic and referred to the ED. She describes gradually worsening dyspnea over days to weeks but denies cough, fever, chills, or prior lower extremity swelling until the ankle injury. She has a right upper extremity AV fistula ready for use and reports that her nephrologist, Dr. Marlee, recently informed her that her kidney function is declining.  On arrival, the patient was afebrile, tachypneic, with normal heart rate, elevated blood pressure, and oxygen saturation maintained on 2 L/min supplemental oxygen. Labs were notable for BUN 76 mg/dL, creatinine 3.41 mg/dL, hemoglobin 9.3 g/dL, BNP 327 pg/mL, and a normal WBC count. Right ankle radiographs showed no fracture or dislocation. Chest X-ray demonstrated cardiomegaly with pulmonary vascular congestion and perihilar edema.The patient was treated with IV furosemide , albuterol , and ipratropium. She is currently on 2 L of O2 satting at 98-100%  Nephrology was consulted for AKI on CKD V   Trend in Creatinine: Creatinine  Date/Time Value Ref Range Status  05/21/2022 12:29 PM 3.61 (HH) 0.44 - 1.00 mg/dL Final    Comment:    CRITICAL RESULT CALLED TO, READ BACK BY AND VERIFIED WITH: Kelsey Desota, RN at 1330 by CHRISTELLA Rattler    Creat  Date/Time Value Ref Range Status  02/13/2017 10:08 AM 1.73 (H) 0.50 - 1.05 mg/dL Final    Comment:      For patients > or = 60 years of age: The upper reference limit for Creatinine is approximately 13% higher for people  identified as African-American.     11/26/2016 08:58 AM 1.85 (H) 0.50 - 1.05 mg/dL Final    Comment:      For patients > or = 60 years of age: The upper reference limit for Creatinine is approximately 13% higher for people identified as African-American.     11/12/2016 10:40 AM 2.19 (H) 0.50 - 1.05 mg/dL Final    Comment:      For patients > or = 60 years of age: The upper reference limit for Creatinine is approximately 13% higher for people identified as African-American.     11/19/2012 10:39 AM 0.91 0.50 - 1.10 mg/dL Final  87/86/7986 96:40 PM 0.74 0.50 - 1.10 mg/dL Final   Creatinine, Ser  Date/Time Value Ref Range Status  09/07/2024 04:31 AM 6.45 (H) 0.44 - 1.00 mg/dL Final  89/92/7974 91:85 PM 6.58 (H) 0.44 - 1.00 mg/dL Final  90/96/7975 89:42 AM 4.36 (H) 0.44 - 1.00 mg/dL Final  96/70/7975 87:64 PM 5.38 (H) 0.44 - 1.00 mg/dL Final  96/70/7975 92:86 AM 5.62 (H) 0.44 - 1.00 mg/dL Final  96/71/7975 88:73 PM 5.44 (H) 0.44 - 1.00 mg/dL Final  96/71/7975 87:84 AM 5.16 (H) 0.44 - 1.00 mg/dL Final  96/72/7975 87:71 AM 4.77 (H) 0.44 - 1.00 mg/dL Final  96/73/7975 87:87 AM 4.74 (H) 0.44 - 1.00 mg/dL Final  96/74/7975 87:66 AM 5.21 (H) 0.44 - 1.00 mg/dL Final  96/75/7975 92:75 AM 5.33 (H) 0.44 - 1.00 mg/dL Final  96/76/7975 91:47 AM 4.76 (H) 0.44 - 1.00 mg/dL Final  96/77/7975 88:74 AM 4.10 (H)  0.44 - 1.00 mg/dL Final  96/80/7975 88:59 AM 3.74 (H) 0.44 - 1.00 mg/dL Final  95/95/7976 93:85 AM 3.90 (H) 0.44 - 1.00 mg/dL Final  88/87/7979 88:85 AM 2.59 (H) 0.57 - 1.00 mg/dL Final  93/79/7980 89:71 AM 2.66 (H) 0.57 - 1.00 mg/dL Final  88/85/7981 89:64 AM 2.07 (H) 0.57 - 1.00 mg/dL Final  88/97/7981 96:74 PM 2.64 (H) 0.57 - 1.00 mg/dL Final  94/76/7981 96:66 PM 2.05 (H) 0.57 - 1.00 mg/dL Final  97/79/7981 95:49 AM 1.79 (H) 0.44 - 1.00 mg/dL Final  97/80/7981 95:53 PM 1.87 (H) 0.44 - 1.00 mg/dL Final  93/93/7983 92:76 PM 2.12 (H) 0.44 - 1.00 mg/dL Final  91/69/7986 89:93 PM 0.69  0.50 - 1.10 mg/dL Final    PMH:   Past Medical History:  Diagnosis Date   Anemia    Blind    Both eyes   Breast cancer (HCC)    Chronic kidney disease    Diabetes mellitus    Hypertension    Personal history of radiation therapy 07/2022   Left   Sickle cell disease, type S (HCC)    Trait   Stroke (HCC) 2017   TIA    PSH:   Past Surgical History:  Procedure Laterality Date   AV FISTULA PLACEMENT Right 03/04/2022   Procedure: RIGHT ARM BRACHIOCEPHALIC FISTULA CREATION;  Surgeon: Lanis Fonda BRAVO, MD;  Location: [facility] OR;  Service: Vascular;  Laterality: Right;  PERIPHERAL NERVE BLOCK   BREAST LUMPECTOMY WITH RADIOACTIVE SEED LOCALIZATION Left 05/27/2022   Procedure: LEFT BREAST LUMPECTOMY WITH RADIOACTIVE SEED LOCALIZATION;  Surgeon: Belinda Cough, MD;  Location: [facility] OR;  Service: General;  Laterality: Left;  LMA   COLONOSCOPY     EYE SURGERY Left    retinal detachment repair 2017   EYE SURGERY Left    ? type of surgery to repair blood vessels 2018   GANGLION CYST EXCISION     Left dorsal wrist   NASAL SEPTOPLASTY W/ TURBINOPLASTY N/A 02/20/2023   Procedure: NASAL SEPTOPLASTY WITH BILATERAL TURBINATE REDUCTION;  Surgeon: Karis Clunes, MD;  Location: [facility] OR;  Service: ENT;  Laterality: N/A;   RETINAL DETACHMENT SURGERY Left    2017, [place]    Allergies: No Known Allergies  Medications:   Prior to Admission medications   Medication Sig Start Date End Date Taking? Authorizing Provider  amLODipine  (NORVASC ) 5 MG tablet Take 1 tablet (5 mg total) by mouth daily. 02/28/23 11/30/24 Yes Sharl Gee, MD  atorvastatin  (LIPITOR ) 20 MG tablet Take 20 mg by mouth at bedtime. 08/03/24  Yes [provider]  calcitRIOL  (ROCALTROL ) 0.25 MCG capsule TAKE 1 CAPSULE EVERY DAY 08/26/21  Yes Simmons-Robinson, Makiera, MD  calcium  acetate (PHOSLO) 667 MG capsule Take 667 mg by mouth 3 (three) times daily. 07/20/24  Yes [provider]  carvedilol  (COREG ) 12.5 MG tablet Take 12.5  mg by mouth 2 (two) times daily. 08/05/24  Yes [provider]  LANTUS SOLOSTAR 100 UNIT/ML Solostar Pen 10 Units at bedtime. 06/21/24  Yes [provider]  losartan (COZAAR) 50 MG tablet Take 50 mg by mouth at bedtime. 06/23/24  Yes [provider]  sodium bicarbonate  650 MG tablet Take 1,300 mg by mouth 2 (two) times daily. 06/02/18  Yes [provider]  tamoxifen  (NOLVADEX ) 10 MG tablet TAKE 1 TABLET EVERY DAY 02/29/24  Yes Gudena, Vinay, MD    Inpatient medications:  amLODipine   5 mg Oral Daily   atorvastatin   40 mg Oral QHS  calcitRIOL   0.25 mcg Oral QODAY   furosemide   40 mg Oral Once   heparin   5,000 Units Subcutaneous Q8H   insulin  aspart  0-5 Units Subcutaneous QHS   insulin  aspart  0-6 Units Subcutaneous TID WC   sodium bicarbonate   1,300 mg Oral BID   sodium chloride  flush  3 mL Intravenous Q12H   tamoxifen   10 mg Oral Daily    Discontinued Meds:   Medications Discontinued During This Encounter  Medication Reason   atorvastatin  (LIPITOR ) 40 MG tablet Dose change   Blood Glucose Monitoring Suppl (PRODIGY AUTOCODE BLOOD GLUCOSE) w/Device KIT    iron  polysaccharides (NIFEREX) 150 MG capsule Discontinued by provider   furosemide  (LASIX ) 40 MG tablet Discontinued by provider   furosemide  (LASIX ) injection 40 mg     Social History:  reports that she has never smoked. She has never used smokeless tobacco. She reports that she does not currently use alcohol after a past usage of about 2.0 standard drinks of alcohol per week. She reports that she does not use drugs.  Family History:   Family History  Problem Relation Age of Onset   Asthma Mother    COPD Mother    Hypertension Mother    Stroke Mother    Breast cancer Sister 16   Diabetes Daughter    Diabetes Son      Weight change:  No intake or output data in the 24 hours ending 09/07/24 1301 BP (!) 183/79   Pulse 68   Temp 98.6 F (37 C) (Oral)   Resp 19   Ht 5' 5 (1.651 m)   Wt  112 kg   LMP 04/28/2018   SpO2 97%   BMI 41.10 kg/m  Vitals:   09/07/24 0900 09/07/24 0916 09/07/24 0945 09/07/24 0958  BP: (!) 190/76  (!) 183/79   Pulse: 70  67 68  Resp: 20  (!) 23 19  Temp:  98.6 F (37 C)    TempSrc:  Oral    SpO2: 100%  93% 97%  Weight:      Height:         General appearance: alert, cooperative, and no distress Eyes: Legally blind in both eyes Resp: clear to auscultation bilaterally Cardio: regular rate and rhythm and S1, S2 normal Extremities: AV fistula on right arm with palpable thrills. Extremities normal, atraumatic, no cyanosis,Right ankle trace edema. LLE with no swelling, and Homans sign is negative, no sign of DVT Pulses: 2+ and symmetric Skin: Skin color, texture, turgor normal. No rashes or lesions  Labs: Basic Metabolic Panel: Recent Labs  Lab 09/06/24 2014 09/07/24 0431  NA 143 140  K 4.8 3.8  CL 114* 108  CO2 21* 21*  GLUCOSE 114* 215*  BUN 76* 75*  CREATININE 6.58* 6.45*  ALBUMIN 3.2*  --   CALCIUM  8.8* 8.5*  PHOS  --  4.5   Liver Function Tests: Recent Labs  Lab 09/06/24 2014  AST 21  ALT 28  ALKPHOS 71  BILITOT 1.3*  PROT 6.0*  ALBUMIN 3.2*   No results for input(s): LIPASE, AMYLASE in the last 168 hours. No results for input(s): AMMONIA in the last 168 hours. CBC: Recent Labs  Lab 09/06/24 2014 09/07/24 0431  WBC 7.8 6.6  NEUTROABS 6.0  --   HGB 9.3* 8.2*  HCT 30.8* 25.9*  MCV 98.1 94.5  PLT 198 190   PT/INR: @LABRCNTIP (inr:5) Cardiac Enzymes: )No results for input(s): CKTOTAL, CKMB, CKMBINDEX, TROPONINI in the last 168 hours. CBG:  Recent Labs  Lab 09/07/24 0025 09/07/24 0802 09/07/24 1228  GLUCAP 133* 161* 169*    Iron  Studies:  Recent Labs  Lab 09/07/24 0431  IRON  23*  TIBC 309  FERRITIN 96    Xrays/Other Studies: DG Ankle Complete Right Result Date: 09/06/2024 CLINICAL DATA:  Right ankle pain of unknown etiology. EXAM: RIGHT ANKLE - COMPLETE 3+ VIEW COMPARISON:  No  prior comparison studies are available. FINDINGS: No evidence of acute fracture or dislocation. No focal bone lesion or bone destruction. Joint spaces are normal. Plantar and Achilles calcaneal spurs. Degenerative changes in the visualized intertarsal joints. Diffuse soft tissue swelling with infiltration in the subcutaneous fat likely representing edema. No radiopaque soft tissue foreign bodies or soft tissue gas. IMPRESSION: No acute bony abnormalities.  Diffuse soft tissue edema suggested. Electronically Signed   By: Elsie Gravely M.D.   On: 09/06/2024 19:04   DG Chest 1 View Result Date: 09/06/2024 CLINICAL DATA:  Cough EXAM: CHEST  1 VIEW COMPARISON:  02/22/2023 FINDINGS: Shallow inspiration. Cardiac enlargement with pulmonary vascular congestion and mild perihilar edema, progressing since prior study. No pleural effusion or pneumothorax. Mediastinal contours appear intact. Degenerative changes in the spine and shoulders. IMPRESSION: Cardiac enlargement with pulmonary vascular congestion and perihilar edema demonstrating progression since prior study. Electronically Signed   By: Elsie Gravely M.D.   On: 09/06/2024 19:02     Assessment/Plan:  AKI on CKD V The patient has had declining kidney function since December 2013, which has not recovered. Her most recent creatinine was 4.3 mg/dL in September 2024, with a baseline range of 3-5 mg/dL, but on this admission it was found to be 6.58 mg/dL, likely secondary to heart failure exacerbation with a cardiorenal picture. There is no evidence of infection, and the patient reports no change in urine output. She is eating and drinking well. BNP is elevated, and imaging findings are consistent with volume overload in the setting of chronic heart failure. UA shows proteinuria which is expected but negative for urinary infections    - No urgent need for dialysis at this time given stable electrolytes, no evidence uremic encephalopathy and mild acidosis.I  however worry about her refractory volume overload despite home lasix . She has no reliable urine out or weight measurement at home.    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

## 2024-09-07 NOTE — Progress Notes (Signed)
 Heart Failure Navigator Progress Note  Assessed for Heart & Vascular TOC clinic readiness.  Patient does not meet criteria due to is seen by [facility]. No HF TOC. .   Navigator will sign off at this time.   Stephane Haddock, BSN, Scientist, clinical (histocompatibility and immunogenetics) Only

## 2024-09-07 NOTE — ED Notes (Signed)
 EVS called to replace the SHARPS bin.

## 2024-09-07 NOTE — Assessment & Plan Note (Signed)
 09/07/24 nephrology consulted to assist with management of intravascular volume. Pt still urinates. Defer to nephrology to dose her IV lasix  given acute renal failure on CKD stage 5. Pt has AVF that was placed in 2023.  09/08/24 nephrology continuing with IV lasix  60 mg q12. Monitor renal function. Appears pt may not need to initiate HD during this hospital admission.

## 2024-09-07 NOTE — Assessment & Plan Note (Addendum)
 09/07/24 unclear the cause of her ARF. Pt is not uremic. She is still urinating. Hopefully she can avoid having to start HD now. Nephrology consulted.  09/08/24 BUN 75. Scr 6.62 today. Relatively stable since yesterday. Scr of 6-7 may be her new baseline. Pt is not having any uremic symptoms. Nephrology following.

## 2024-09-07 NOTE — Assessment & Plan Note (Addendum)
 09/07/24 chronic.

## 2024-09-07 NOTE — Assessment & Plan Note (Addendum)
 09/07/24 due to hypervolemia from CKD.  09/08/24 clinically improving. Lungs are clear.

## 2024-09-07 NOTE — Assessment & Plan Note (Addendum)
 09/07/24 on SSI.  09/08/24  stable.

## 2024-09-07 NOTE — Assessment & Plan Note (Addendum)
 09/07/24 on norvasc . Restart coreg . LVEF 65%  09/08/24 on coreg  and norvasc .

## 2024-09-08 DIAGNOSIS — J9601 Acute respiratory failure with hypoxia: Secondary | ICD-10-CM | POA: Diagnosis not present

## 2024-09-08 DIAGNOSIS — E8770 Fluid overload, unspecified: Secondary | ICD-10-CM | POA: Diagnosis not present

## 2024-09-08 DIAGNOSIS — E66813 Obesity, class 3: Secondary | ICD-10-CM

## 2024-09-08 DIAGNOSIS — N179 Acute kidney failure, unspecified: Secondary | ICD-10-CM | POA: Diagnosis not present

## 2024-09-08 DIAGNOSIS — J810 Acute pulmonary edema: Secondary | ICD-10-CM | POA: Diagnosis not present

## 2024-09-08 LAB — UREA NITROGEN, URINE: Urea Nitrogen, Ur: 202 mg/dL

## 2024-09-08 LAB — BASIC METABOLIC PANEL WITH GFR
Anion gap: 12 (ref 5–15)
BUN: 75 mg/dL — ABNORMAL HIGH (ref 6–20)
CO2: 22 mmol/L (ref 22–32)
Calcium: 8.5 mg/dL — ABNORMAL LOW (ref 8.9–10.3)
Chloride: 108 mmol/L (ref 98–111)
Creatinine, Ser: 6.62 mg/dL — ABNORMAL HIGH (ref 0.44–1.00)
GFR, Estimated: 7 mL/min — ABNORMAL LOW (ref >60)
Glucose, Bld: 148 mg/dL — ABNORMAL HIGH (ref 70–99)
Potassium: 4.1 mmol/L (ref 3.5–5.1)
Sodium: 142 mmol/L (ref 135–145)

## 2024-09-08 LAB — GLUCOSE, CAPILLARY
Glucose-Capillary: 128 mg/dL — ABNORMAL HIGH (ref 70–99)
Glucose-Capillary: 158 mg/dL — ABNORMAL HIGH (ref 70–99)
Glucose-Capillary: 191 mg/dL — ABNORMAL HIGH (ref 70–99)
Glucose-Capillary: 201 mg/dL — ABNORMAL HIGH (ref 70–99)

## 2024-09-08 LAB — CBC
HCT: 25.3 % — ABNORMAL LOW (ref 36.0–46.0)
Hemoglobin: 8.2 g/dL — ABNORMAL LOW (ref 12.0–15.0)
MCH: 29.8 pg (ref 26.0–34.0)
MCHC: 32.4 g/dL (ref 30.0–36.0)
MCV: 92 fL (ref 80.0–100.0)
Platelets: 178 K/uL (ref 150–400)
RBC: 2.75 MIL/uL — ABNORMAL LOW (ref 3.87–5.11)
RDW: 13.9 % (ref 11.5–15.5)
WBC: 6.2 K/uL (ref 4.0–10.5)
nRBC: 0 % (ref 0.0–0.2)

## 2024-09-08 MED ORDER — FENTANYL CITRATE (PF) 50 MCG/ML IJ SOSY: 25.0000 ug | PREFILLED_SYRINGE | INTRAMUSCULAR | Status: DC | PRN

## 2024-09-08 MED ORDER — SODIUM CHLORIDE 0.9 % IV SOLN
200.0000 mg | INTRAVENOUS | Status: AC
  Administered 2024-09-08 – 2024-09-09 (×2): 200 mg via INTRAVENOUS
  Filled 2024-09-08 (×2): qty 10

## 2024-09-08 NOTE — Plan of Care (Signed)
  Problem: Cardiac: Goal: Ability to achieve and maintain adequate cardiopulmonary perfusion will improve Outcome: Progressing   Problem: Activity: Goal: Capacity to carry out activities will improve Outcome: Progressing   Problem: Education: Goal: Ability to verbalize understanding of medication therapies will improve Outcome: Progressing   Problem: Safety: Goal: Ability to remain free from injury will improve Outcome: Progressing   Problem: Skin Integrity: Goal: Risk for impaired skin integrity will decrease Outcome: Progressing   Problem: Pain Managment: Goal: General experience of comfort will improve and/or be controlled Outcome: Progressing

## 2024-09-08 NOTE — Progress Notes (Signed)
 PROGRESS NOTE    Sarah Kline  FMW:[id number] DOB: [birth year] DOA: 09/06/2024 PCP: Campbell Reynolds, NP  Subjective: Pt seen and examined. Breathing ok at rest. Gets DOE when oob. RA sats dropped to 83% when walking to bathroom. Remains on IV lasix  per nephrology.  BUN 75. Scr 6.62 today. Relatively stable since yesterday.   Hospital Course: CC: right ankle injury, SOB  HPI:  Sarah Kline is a 58 y.o. female with medical history significant for hypertension, type 2 diabetes mellitus, hyperlipidemia, breast cancer, and CKD stage V not yet on dialysis who presents with right ankle pain and swelling, shortness of breath, and hypoxia.   Patient reports that she twisted her ankle today and has had right ankle pain and swelling.  She initially sought evaluation for this in an outpatient clinic where she was noted to be hypoxic and therefore directed to the ED.  Patient reports insidiously worsening dyspnea over the course of days to weeks but denies any significant cough, denies fever or chills, and denies any leg swelling until the right ankle injury today.  Patient states that she has a fistula in her right upper extremity that is ready for use, and notes that her nephrologist, Dr. Marlee, recently told her that her kidney function was worsening.   ED Course: Upon arrival to the ED, patient is found to be afebrile and saturating well on 2 L/min of supplemental oxygen with mild tachypnea, normal HR, and elevated BP.  Labs are most notable for BUN 76, creatinine 6.58, normal WBC, hemoglobin 9.3, and BNP 672.  Plain radiographs of the right ankle are negative for acute fracture or dislocation.  Chest x-ray is concerning for cardiomegaly and pulmonary vascular congestion with perihilar edema.   Patient was treated in the ED with IV Lasix , albuterol , and Atrovent.  Significant Events: Admitted 09/06/2024 for acute on chronic diastolic CHF, acute hypoxic respiratory failure, AKI on CKD stage  5   Admission Labs: WBC 7.8, HgB 9,3 plt 198 Na 143, K 4.8, CO2 of 21, BUN 76, Scr 6.58 T. Prot 6.0, alb 3.2, AST 21, ALT 28, alk phos 71, T. Bili 1.3 BNP 672 Fe 23, TIBC 309, %sat 7, ferritin 96 Vit B12 of 423  Admission Imaging Studies: CXR Cardiac enlargement with pulmonary vascular congestion and perihilar edema demonstrating progression since prior study. Right ankle XR No acute bony abnormalities. Diffuse soft tissue edema suggested.   Significant Labs:   Significant Imaging Studies: Echo LVEF 65-70%  Antibiotic Therapy: Anti-infectives (From admission, onward)    None       Procedures:   Consultants: nephrology    Assessment and Plan: * Hypervolemia associated with renal insufficiency 09/07/24 nephrology consulted to assist with management of intravascular volume. Pt still urinates. Defer to nephrology to dose her IV lasix  given acute renal failure on CKD stage 5. Pt has AVF that was placed in 2023.  09/08/24 nephrology continuing with IV lasix  60 mg q12. Monitor renal function. Appears pt may not need to initiate HD during this hospital admission.   Acute respiratory failure with hypoxia (HCC) 09/07/24 pt not on home O2. Due to acute pulmonary edema from hypervolemia. Wean to RA as tolerated.  09/08/24 remains on supplemental O2. RA sats dropped to 83% when getting out of bed today.    Acute pulmonary edema (HCC) 09/07/24 due to hypervolemia from CKD.  09/08/24 clinically improving. Lungs are clear.    Acute renal failure superimposed on stage 5 chronic kidney disease, not on chronic  dialysis [facility]) 09/07/24 unclear the cause of her ARF. Pt is not uremic. She is still urinating. Hopefully she can avoid having to start HD now. Nephrology consulted.  09/08/24 BUN 75. Scr 6.62 today. Relatively stable since yesterday. Scr of 6-7 may be her new baseline. Pt is not having any uremic symptoms. Nephrology following.    Chronic diastolic CHF (congestive  heart failure) (HCC) 09/07/24 chronic. LE edema has improved with diuresis.  09/08/24 continue with IV lasix  60 mg q12h per nephrology.    Anemia of chronic renal failure, stage 5 (HCC) 09/07/24 management per nephrology   Legally blind 09/07/24 chronic.   Essential hypertension 09/07/24 on norvasc . Restart coreg . LVEF 65%  09/08/24 on coreg  and norvasc .    Diabetes mellitus with hyperglycemia (HCC) 09/07/24 on SSI.  09/08/24  stable.  Obesity, Class III, BMI 40-49.9 (morbid obesity) (HCC) Body mass index is 42.53 kg/m.   DVT prophylaxis: heparin  injection 5,000 Units Start: 09/07/24 1400    Code Status: Full Code Family Communication: no family at bedside Disposition Plan: return home Reason for continuing need for hospitalization: remains on IV lasix . Nephrology following.  Objective: Vitals:   09/08/24 0955 09/08/24 1000 09/08/24 1005 09/08/24 1218  BP:    (!) 148/58  Pulse: 61 62 66   Resp:    20  Temp:    98.4 F (36.9 C)  TempSrc:    Oral  SpO2: (!) 83% (!) 84% 99% 99%  Weight:      Height:        Intake/Output Summary (Last 24 hours) at 09/08/2024 1507 Last data filed at 09/08/2024 0558 Gross per 24 hour  Intake 208.36 ml  Output 1400 ml  Net -1191.64 ml   Filed Weights   09/07/24 1614 09/07/24 1700 09/08/24 0603  Weight: 112.4 kg 112.4 kg 111.7 kg    Examination:  Physical Exam Vitals and nursing note reviewed.  Constitutional:      Appearance: She is obese.  HENT:     Head: Normocephalic and atraumatic.  Cardiovascular:     Rate and Rhythm: Normal rate and regular rhythm.  Pulmonary:     Effort: Pulmonary effort is normal. No respiratory distress.     Breath sounds: Normal breath sounds. No wheezing or rales.  Abdominal:     General: Bowel sounds are normal.     Palpations: Abdomen is soft.  Musculoskeletal:     Comments: Trace right ankle edema No left ankle edema No pedal edema bilaterally.  Skin:    Capillary Refill:  Capillary refill takes less than 2 seconds.  Neurological:     Mental Status: She is alert and oriented to person, place, and time.     Comments: Pt is blind.     Data Reviewed: I have personally reviewed following labs and imaging studies  CBC: Recent Labs  Lab 09/06/24 2014 09/07/24 0431 09/08/24 0248  WBC 7.8 6.6 6.2  NEUTROABS 6.0  --   --   HGB 9.3* 8.2* 8.2*  HCT 30.8* 25.9* 25.3*  MCV 98.1 94.5 92.0  PLT 198 190 178   Basic Metabolic Panel: Recent Labs  Lab 09/06/24 2014 09/07/24 0431 09/08/24 0248  NA 143 140 142  K 4.8 3.8 4.1  CL 114* 108 108  CO2 21* 21* 22  GLUCOSE 114* 215* 148*  BUN 76* 75* 75*  CREATININE 6.58* 6.45* 6.62*  CALCIUM  8.8* 8.5* 8.5*  MG  --  2.0  --   PHOS  --  4.5  --  GFR: Estimated Creatinine Clearance: 11.2 mL/min (A) (by C-G formula based on SCr of 6.62 mg/dL (H)). Liver Function Tests: Recent Labs  Lab 09/06/24 2014  AST 21  ALT 28  ALKPHOS 71  BILITOT 1.3*  PROT 6.0*  ALBUMIN 3.2*   BNP (last 3 results) Recent Labs    09/06/24 2014  BNP 672.2*   HbA1C: Recent Labs    09/07/24 0431  HGBA1C 5.9*   CBG: Recent Labs  Lab 09/07/24 1228 09/07/24 1757 09/07/24 2104 09/08/24 0554 09/08/24 1214  GLUCAP 169* 166* 174* 128* 158*   Anemia Panel: Recent Labs    09/07/24 0431  VITAMINB12 423  FOLATE 11.9  FERRITIN 96  TIBC 309  IRON  23*  RETICCTPCT 2.9   Radiology Studies: ECHOCARDIOGRAM COMPLETE Result Date: 09/07/2024    ECHOCARDIOGRAM REPORT   Patient Name:   SARANYA HARLIN Date of Exam: 09/07/2024 Medical Rec #:  [id number]          Height:       65.0 in Accession #:    [id number]         Weight:       247.0 lb Date of Birth:  06-Jun-1966         BSA:          2.164 m Patient Age:    57 years           BP:           189/55 mmHg Patient Gender: F                  HR:           66 bpm. Exam Location:  Inpatient Procedure: 2D Echo, Cardiac Doppler, Color Doppler and Intracardiac            Opacification  Agent (Both Spectral and Color Flow Doppler were            utilized during procedure). Indications:    I50.31 Acute diastolic (congestive) heart failure  History:        Patient has prior history of Echocardiogram examinations, most                 recent 02/22/2023. CKD V; Risk Factors:Hypertension, Diabetes and                 Dyslipidemia.  Sonographer:    Damien Senior RDCS Referring Phys: [id number] TIMOTHY S OPYD  Sonographer Comments: Technically difficult study due to patient body habitus IMPRESSIONS  1. Left ventricular ejection fraction, by estimation, is 65 to 70%. The left ventricle has normal function. The left ventricle has no regional wall motion abnormalities. There is moderate concentric left ventricular hypertrophy. Left ventricular diastolic parameters are consistent with Grade I diastolic dysfunction (impaired relaxation).  2. Right ventricular systolic function is normal. The right ventricular size is not well visualized. Tricuspid regurgitation signal is inadequate for assessing PA pressure.  3. Left atrial size was mildly dilated.  4. The mitral valve is grossly normal. No evidence of mitral valve regurgitation.  5. The aortic valve has an indeterminant number of cusps. Aortic valve regurgitation is trivial.  6. The inferior vena cava is normal in size with <50% respiratory variability, suggesting right atrial pressure of 8 mmHg. Comparison(s): A prior study was performed on 02/22/2023. There was grade 2 diastolic dysfunction with elevated LVEDP. NO other significant change. FINDINGS  Left Ventricle: Left ventricular ejection fraction, by estimation, is 65 to 70%. The left ventricle has normal  function. The left ventricle has no regional wall motion abnormalities. Definity contrast agent was given IV to delineate the left ventricular  endocardial borders. The left ventricular internal cavity size was normal in size. There is moderate concentric left ventricular hypertrophy. Left ventricular  diastolic parameters are consistent with Grade I diastolic dysfunction (impaired relaxation). Normal left ventricular filling pressure. Right Ventricle: The right ventricular size is not well visualized. Right vetricular wall thickness was not well visualized. Right ventricular systolic function is normal. Tricuspid regurgitation signal is inadequate for assessing PA pressure. Left Atrium: Left atrial size was mildly dilated. Right Atrium: Right atrial size was not well visualized. Pericardium: Trivial pericardial effusion is present. Mitral Valve: The mitral valve is grossly normal. No evidence of mitral valve regurgitation. Tricuspid Valve: The tricuspid valve is grossly normal. Tricuspid valve regurgitation is trivial. Aortic Valve: The aortic valve has an indeterminant number of cusps. Aortic valve regurgitation is trivial. Aortic valve mean gradient measures 8.0 mmHg. Aortic valve peak gradient measures 14.1 mmHg. Aortic valve area, by VTI measures 1.72 cm. Pulmonic Valve: The pulmonic valve was grossly normal. Pulmonic valve regurgitation is trivial. Aorta: The aortic root and ascending aorta are structurally normal, with no evidence of dilitation. Venous: The inferior vena cava is normal in size with less than 50% respiratory variability, suggesting right atrial pressure of 8 mmHg. IAS/Shunts: The atrial septum is grossly normal.  LEFT VENTRICLE PLAX 2D LVIDd:         3.40 cm   Diastology LVIDs:         2.20 cm   LV e' medial:    5.33 cm/s LV PW:         1.60 cm   LV E/e' medial:  18.1 LV IVS:        1.50 cm   LV e' lateral:   5.66 cm/s LVOT diam:     1.90 cm   LV E/e' lateral: 17.0 LV SV:         75 LV SV Index:   35 LVOT Area:     2.84 cm LV IVRT:       120 msec  RIGHT VENTRICLE RV S prime:     11.70 cm/s TAPSE (M-mode): 3.2 cm LEFT ATRIUM             Index LA diam:        4.20 cm 1.94 cm/m LA Vol (A2C):   58.8 ml 27.17 ml/m LA Vol (A4C):   59.0 ml 27.27 ml/m LA Biplane Vol: 59.2 ml 27.36 ml/m  AORTIC  VALVE AV Area (Vmax):    1.66 cm AV Area (Vmean):   1.74 cm AV Area (VTI):     1.72 cm AV Vmax:           188.00 cm/s AV Vmean:          139.000 cm/s AV VTI:            0.439 m AV Peak Grad:      14.1 mmHg AV Mean Grad:      8.0 mmHg LVOT Vmax:         110.00 cm/s LVOT Vmean:        85.400 cm/s LVOT VTI:          0.266 m LVOT/AV VTI ratio: 0.61  AORTA Ao Root diam: 2.70 cm Ao Asc diam:  3.60 cm MITRAL VALVE MV Area (PHT): 2.50 cm     SHUNTS MV Decel Time: 303 msec     Systemic  VTI:  0.27 m MV E velocity: 96.30 cm/s   Systemic Diam: 1.90 cm MV A velocity: 129.00 cm/s MV E/A ratio:  0.75 Emeline Calender Electronically signed by Emeline Calender Signature Date/Time: 09/07/2024/4:54:06 PM    Final    DG Ankle Complete Right Result Date: 09/06/2024 CLINICAL DATA:  Right ankle pain of unknown etiology. EXAM: RIGHT ANKLE - COMPLETE 3+ VIEW COMPARISON:  No prior comparison studies are available. FINDINGS: No evidence of acute fracture or dislocation. No focal bone lesion or bone destruction. Joint spaces are normal. Plantar and Achilles calcaneal spurs. Degenerative changes in the visualized intertarsal joints. Diffuse soft tissue swelling with infiltration in the subcutaneous fat likely representing edema. No radiopaque soft tissue foreign bodies or soft tissue gas. IMPRESSION: No acute bony abnormalities.  Diffuse soft tissue edema suggested. Electronically Signed   By: Elsie Gravely M.D.   On: 09/06/2024 19:04   DG Chest 1 View Result Date: 09/06/2024 CLINICAL DATA:  Cough EXAM: CHEST  1 VIEW COMPARISON:  02/22/2023 FINDINGS: Shallow inspiration. Cardiac enlargement with pulmonary vascular congestion and mild perihilar edema, progressing since prior study. No pleural effusion or pneumothorax. Mediastinal contours appear intact. Degenerative changes in the spine and shoulders. IMPRESSION: Cardiac enlargement with pulmonary vascular congestion and perihilar edema demonstrating progression since prior study.  Electronically Signed   By: Elsie Gravely M.D.   On: 09/06/2024 19:02    Scheduled Meds:  amLODipine   10 mg Oral Daily   atorvastatin   40 mg Oral QHS   calcitRIOL   0.25 mcg Oral QODAY   carvedilol   12.5 mg Oral BID   furosemide   60 mg Intravenous BID   heparin   5,000 Units Subcutaneous Q8H   insulin  aspart  0-5 Units Subcutaneous QHS   insulin  aspart  0-6 Units Subcutaneous TID WC   sodium bicarbonate   1,300 mg Oral BID   sodium chloride  flush  3 mL Intravenous Q12H   tamoxifen   10 mg Oral Daily   Continuous Infusions:  iron  sucrose       LOS: 2 days   Time spent: 55 minutes  Camellia Door, DO  [facility]  09/08/2024, 3:07 PM

## 2024-09-08 NOTE — Progress Notes (Addendum)
 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  The patient reports twisting her right ankle today, resulting in pain and swelling. She initially sought care at an outpatient clinic, where she was noted to be hypoxic and referred to the ED. She describes gradually worsening dyspnea over days to weeks but denies cough, fever, chills, or prior lower extremity swelling until the ankle injury. She has a right upper extremity AV fistula ready for use and reports that her nephrologist, Dr. Marlee, recently informed her that her kidney function is declining.  On arrival, the patient was afebrile, tachypneic, with normal heart rate, elevated blood pressure, and oxygen saturation maintained on 2 L/min supplemental oxygen. Labs were notable for BUN 76 mg/dL, creatinine 3.41 mg/dL, hemoglobin 9.3 g/dL, BNP 327 pg/mL, and a normal WBC count. Right ankle radiographs showed no fracture or dislocation. Chest X-ray demonstrated cardiomegaly with pulmonary vascular congestion and perihilar edema.The patient was treated with IV furosemide , albuterol , and ipratropium. She is currently on 2 L of O2 satting at 98-100%.Nephrology was consulted for AKI on CKD V.  INTERVAL HISTORY  10/9 : Patient seen at bedside this morning. She endorse resolution of her ankle pain and shortness of breath. Reports she is better and asking when she can go home. Had no acute events overnight.      Trend in Creatinine: Creatinine  Date/Time Value Ref Range Status  05/21/2022 12:29 PM 3.61 (HH) 0.44 - 1.00 mg/dL Final    Comment:    CRITICAL RESULT CALLED TO, READ BACK BY AND VERIFIED WITH: Larraine Curt, RN at 1330 by CHRISTELLA Rattler    Creat   Date/Time Value Ref Range Status  02/13/2017 10:08 AM 1.73 (H) 0.50 - 1.05 mg/dL Final    Comment:      For patients > or = 60 years of age: The upper reference limit for Creatinine is approximately 13% higher for people identified as African-American.     11/26/2016 08:58 AM 1.85 (H) 0.50 - 1.05 mg/dL Final    Comment:      For patients > or = 60 years of age: The upper reference limit for Creatinine is approximately 13% higher for people identified as African-American.     11/12/2016 10:40 AM 2.19 (H) 0.50 - 1.05 mg/dL Final    Comment:      For patients > or = 60 years of age: The upper reference limit for Creatinine is approximately 13% higher for people identified as African-American.     11/19/2012 10:39 AM 0.91 0.50 - 1.10 mg/dL Final  87/86/7986 96:40 PM 0.74 0.50 - 1.10 mg/dL Final   Creatinine, Ser  Date/Time Value Ref Range Status  09/08/2024 02:48 AM 6.62 (H) 0.44 - 1.00 mg/dL Final  89/91/7974 95:68 AM 6.45 (H) 0.44 - 1.00 mg/dL Final  89/92/7974 91:85 PM 6.58 (H) 0.44 - 1.00 mg/dL Final  90/96/7975 89:42 AM 4.36 (H) 0.44 - 1.00 mg/dL Final  96/70/7975 87:64 PM 5.38 (H) 0.44 - 1.00 mg/dL Final  96/70/7975 92:86 AM 5.62 (H) 0.44 - 1.00 mg/dL Final  96/71/7975 88:73 PM 5.44 (H) 0.44 - 1.00 mg/dL Final  96/71/7975 87:84 AM 5.16 (H) 0.44 - 1.00 mg/dL Final  96/72/7975 87:71 AM 4.77 (H) 0.44 - 1.00 mg/dL  Final  02/24/2023 12:12 AM 4.74 (H) 0.44 - 1.00 mg/dL Final  96/74/7975 87:66 AM 5.21 (H) 0.44 - 1.00 mg/dL Final  96/75/7975 92:75 AM 5.33 (H) 0.44 - 1.00 mg/dL Final  96/76/7975 91:47 AM 4.76 (H) 0.44 - 1.00 mg/dL Final  96/77/7975 88:74 AM 4.10 (H) 0.44 - 1.00 mg/dL Final  96/80/7975 88:59 AM 3.74 (H) 0.44 - 1.00 mg/dL Final  95/95/7976 93:85 AM 3.90 (H) 0.44 - 1.00 mg/dL Final  88/87/7979 88:85 AM 2.59 (H) 0.57 - 1.00 mg/dL Final  93/79/7980 89:71 AM 2.66 (H) 0.57 - 1.00 mg/dL Final  88/85/7981 89:64 AM 2.07 (H) 0.57 - 1.00 mg/dL Final  88/97/7981 96:74  PM 2.64 (H) 0.57 - 1.00 mg/dL Final  94/76/7981 96:66 PM 2.05 (H) 0.57 - 1.00 mg/dL Final  97/79/7981 95:49 AM 1.79 (H) 0.44 - 1.00 mg/dL Final  97/80/7981 95:53 PM 1.87 (H) 0.44 - 1.00 mg/dL Final  93/93/7983 92:76 PM 2.12 (H) 0.44 - 1.00 mg/dL Final  91/69/7986 89:93 PM 0.69 0.50 - 1.10 mg/dL Final    PMH:   Past Medical History:  Diagnosis Date   Acute renal failure superimposed on stage 5 chronic kidney disease, not on chronic dialysis, unspecified acute renal failure type (HCC) 02/21/2023   Anemia    Blind    Both eyes   Breast cancer (HCC)    Chronic kidney disease    Diabetes mellitus    Hypertension    Personal history of radiation therapy 07/2022   Left   Sickle cell disease, type S (HCC)    Trait   Stroke (HCC) 2017   TIA    PSH:   Past Surgical History:  Procedure Laterality Date   AV FISTULA PLACEMENT Right 03/04/2022   Procedure: RIGHT ARM BRACHIOCEPHALIC FISTULA CREATION;  Surgeon: Lanis Fonda BRAVO, MD;  Location: [facility] OR;  Service: Vascular;  Laterality: Right;  PERIPHERAL NERVE BLOCK   BREAST LUMPECTOMY WITH RADIOACTIVE SEED LOCALIZATION Left 05/27/2022   Procedure: LEFT BREAST LUMPECTOMY WITH RADIOACTIVE SEED LOCALIZATION;  Surgeon: Belinda Cough, MD;  Location: [facility] OR;  Service: General;  Laterality: Left;  LMA   COLONOSCOPY     EYE SURGERY Left    retinal detachment repair 2017   EYE SURGERY Left    ? type of surgery to repair blood vessels 2018   GANGLION CYST EXCISION     Left dorsal wrist   NASAL SEPTOPLASTY W/ TURBINOPLASTY N/A 02/20/2023   Procedure: NASAL SEPTOPLASTY WITH BILATERAL TURBINATE REDUCTION;  Surgeon: Karis Clunes, MD;  Location: [facility] OR;  Service: ENT;  Laterality: N/A;   RETINAL DETACHMENT SURGERY Left    2017, [place]    Allergies: No Known Allergies  Medications:   Prior to Admission medications   Medication Sig Start Date End Date Taking? Authorizing Provider  amLODipine  (NORVASC ) 5 MG tablet Take 1 tablet (5 mg total) by mouth  daily. 02/28/23 11/30/24 Yes Sharl Gee, MD  atorvastatin  (LIPITOR ) 20 MG tablet Take 20 mg by mouth at bedtime. 08/03/24  Yes [provider]  calcitRIOL  (ROCALTROL ) 0.25 MCG capsule TAKE 1 CAPSULE EVERY DAY 08/26/21  Yes Simmons-Robinson, Makiera, MD  calcium  acetate (PHOSLO) 667 MG capsule Take 667 mg by mouth 3 (three) times daily. 07/20/24  Yes [provider]  carvedilol  (COREG ) 12.5 MG tablet Take 12.5 mg by mouth 2 (two) times daily. 08/05/24  Yes [provider]  LANTUS SOLOSTAR 100 UNIT/ML Solostar Pen 10 Units at bedtime. 06/21/24  Yes [provider]  losartan (COZAAR) 50 MG  tablet Take 50 mg by mouth at bedtime. 06/23/24  Yes [provider]  sodium bicarbonate  650 MG tablet Take 1,300 mg by mouth 2 (two) times daily. 06/02/18  Yes [provider]  tamoxifen  (NOLVADEX ) 10 MG tablet TAKE 1 TABLET EVERY DAY 02/29/24  Yes Gudena, Vinay, MD    Inpatient medications:  amLODipine   10 mg Oral Daily   atorvastatin   40 mg Oral QHS   calcitRIOL   0.25 mcg Oral QODAY   carvedilol   12.5 mg Oral BID   furosemide   60 mg Intravenous BID   heparin   5,000 Units Subcutaneous Q8H   insulin  aspart  0-5 Units Subcutaneous QHS   insulin  aspart  0-6 Units Subcutaneous TID WC   sodium bicarbonate   1,300 mg Oral BID   sodium chloride  flush  3 mL Intravenous Q12H   tamoxifen   10 mg Oral Daily    Discontinued Meds:   Medications Discontinued During This Encounter  Medication Reason   atorvastatin  (LIPITOR ) 40 MG tablet Dose change   Blood Glucose Monitoring Suppl (PRODIGY AUTOCODE BLOOD GLUCOSE) w/Device KIT    iron  polysaccharides (NIFEREX) 150 MG capsule Discontinued by provider   furosemide  (LASIX ) 40 MG tablet Discontinued by provider   furosemide  (LASIX ) injection 40 mg    amLODipine  (NORVASC ) tablet 5 mg    furosemide  (LASIX ) 10 MG/ML solution 40 mg    furosemide  (LASIX ) 10 MG/ML solution 60 mg    furosemide  (LASIX ) 10 MG/ML solution 60 mg      Social History:  reports that she has never smoked. She has never used smokeless tobacco. She reports that she does not currently use alcohol after a past usage of about 2.0 standard drinks of alcohol per week. She reports that she does not use drugs.  Family History:   Family History  Problem Relation Age of Onset   Asthma Mother    COPD Mother    Hypertension Mother    Stroke Mother    Breast cancer Sister 58   Diabetes Daughter    Diabetes Son      Weight change: 0.361 kg  Intake/Output Summary (Last 24 hours) at 09/08/2024 0817 Last data filed at 09/08/2024 0558 Gross per 24 hour  Intake 208.36 ml  Output 1400 ml  Net -1191.64 ml   BP (!) 164/69 (BP Location: Left Arm)   Pulse 69   Temp 98.6 F (37 C) (Oral)   Resp 20   Ht 5' 4 (1.626 m)   Wt 111.7 kg   LMP 04/28/2018   SpO2 94%   BMI 42.26 kg/m  Vitals:   09/07/24 2110 09/07/24 2321 09/08/24 0432 09/08/24 0603  BP: (!) 162/53 (!) 149/63 (!) 164/69   Pulse: 67 65 69   Resp:  18 20   Temp:  99 F (37.2 C) 98.6 F (37 C)   TempSrc:  Oral Oral   SpO2:  100% 94%   Weight:    111.7 kg  Height:         General appearance: alert, cooperative, and no distress  Eyes: Legally blind in both eyes Resp: clear to auscultation bilaterally, sating well on RA  Cardio: regular rate and rhythm and S1, S2 normal Extremities: AV fistula on right arm with palpable thrills. Warm and dry  Pulses: 2+ and symmetric Skin: Skin color, texture, turgor normal. No rashes or lesions  Labs: Basic Metabolic Panel: Recent Labs  Lab 09/06/24 2014 09/07/24 0431 09/08/24 0248  NA 143 140 142  K 4.8 3.8 4.1  CL 114* 108 108  CO2 21* 21* 22  GLUCOSE 114* 215* 148*  BUN 76* 75* 75*  CREATININE 6.58* 6.45* 6.62*  ALBUMIN 3.2*  --   --   CALCIUM  8.8* 8.5* 8.5*  PHOS  --  4.5  --    Liver Function Tests: Recent Labs  Lab 09/06/24 2014  AST 21  ALT 28  ALKPHOS 71  BILITOT 1.3*  PROT 6.0*  ALBUMIN 3.2*   No results for  input(s): LIPASE, AMYLASE in the last 168 hours. No results for input(s): AMMONIA in the last 168 hours. CBC: Recent Labs  Lab 09/06/24 2014 09/07/24 0431 09/08/24 0248  WBC 7.8 6.6 6.2  NEUTROABS 6.0  --   --   HGB 9.3* 8.2* 8.2*  HCT 30.8* 25.9* 25.3*  MCV 98.1 94.5 92.0  PLT 198 190 178   PT/INR: @LABRCNTIP (inr:5) Cardiac Enzymes: )No results for input(s): CKTOTAL, CKMB, CKMBINDEX, TROPONINI in the last 168 hours. CBG: Recent Labs  Lab 09/07/24 0802 09/07/24 1228 09/07/24 1757 09/07/24 2104 09/08/24 0554  GLUCAP 161* 169* 166* 174* 128*    Iron  Studies:  Recent Labs  Lab 09/07/24 0431  IRON  23*  TIBC 309  FERRITIN 96    Xrays/Other Studies: ECHOCARDIOGRAM COMPLETE Result Date: 09/07/2024    ECHOCARDIOGRAM REPORT   Patient Name:   Sarah Kline Date of Exam: 09/07/2024 Medical Rec #:  [id number]          Height:       65.0 in Accession #:    [id number]         Weight:       247.0 lb Date of Birth:  11/21/1966         BSA:          2.164 m Patient Age:    57 years           BP:           189/55 mmHg Patient Gender: F                  HR:           66 bpm. Exam Location:  Inpatient Procedure: 2D Echo, Cardiac Doppler, Color Doppler and Intracardiac            Opacification Agent (Both Spectral and Color Flow Doppler were            utilized during procedure). Indications:    I50.31 Acute diastolic (congestive) heart failure  History:        Patient has prior history of Echocardiogram examinations, most                 recent 02/22/2023. CKD V; Risk Factors:Hypertension, Diabetes and                 Dyslipidemia.  Sonographer:    Damien Senior RDCS Referring Phys: [id number] TIMOTHY S OPYD  Sonographer Comments: Technically difficult study due to patient body habitus IMPRESSIONS  1. Left ventricular ejection fraction, by estimation, is 65 to 70%. The left ventricle has normal function. The left ventricle has no regional wall motion abnormalities. There is moderate  concentric left ventricular hypertrophy. Left ventricular diastolic parameters are consistent with Grade I diastolic dysfunction (impaired relaxation).  2. Right ventricular systolic function is normal. The right ventricular size is not well visualized. Tricuspid regurgitation signal is inadequate for assessing PA pressure.  3. Left atrial size was mildly dilated.  4. The mitral valve is grossly normal. No  evidence of mitral valve regurgitation.  5. The aortic valve has an indeterminant number of cusps. Aortic valve regurgitation is trivial.  6. The inferior vena cava is normal in size with <50% respiratory variability, suggesting right atrial pressure of 8 mmHg. Comparison(s): A prior study was performed on 02/22/2023. There was grade 2 diastolic dysfunction with elevated LVEDP. NO other significant change. FINDINGS  Left Ventricle: Left ventricular ejection fraction, by estimation, is 65 to 70%. The left ventricle has normal function. The left ventricle has no regional wall motion abnormalities. Definity contrast agent was given IV to delineate the left ventricular  endocardial borders. The left ventricular internal cavity size was normal in size. There is moderate concentric left ventricular hypertrophy. Left ventricular diastolic parameters are consistent with Grade I diastolic dysfunction (impaired relaxation). Normal left ventricular filling pressure. Right Ventricle: The right ventricular size is not well visualized. Right vetricular wall thickness was not well visualized. Right ventricular systolic function is normal. Tricuspid regurgitation signal is inadequate for assessing PA pressure. Left Atrium: Left atrial size was mildly dilated. Right Atrium: Right atrial size was not well visualized. Pericardium: Trivial pericardial effusion is present. Mitral Valve: The mitral valve is grossly normal. No evidence of mitral valve regurgitation. Tricuspid Valve: The tricuspid valve is grossly normal. Tricuspid valve  regurgitation is trivial. Aortic Valve: The aortic valve has an indeterminant number of cusps. Aortic valve regurgitation is trivial. Aortic valve mean gradient measures 8.0 mmHg. Aortic valve peak gradient measures 14.1 mmHg. Aortic valve area, by VTI measures 1.72 cm. Pulmonic Valve: The pulmonic valve was grossly normal. Pulmonic valve regurgitation is trivial. Aorta: The aortic root and ascending aorta are structurally normal, with no evidence of dilitation. Venous: The inferior vena cava is normal in size with less than 50% respiratory variability, suggesting right atrial pressure of 8 mmHg. IAS/Shunts: The atrial septum is grossly normal.  LEFT VENTRICLE PLAX 2D LVIDd:         3.40 cm   Diastology LVIDs:         2.20 cm   LV e' medial:    5.33 cm/s LV PW:         1.60 cm   LV E/e' medial:  18.1 LV IVS:        1.50 cm   LV e' lateral:   5.66 cm/s LVOT diam:     1.90 cm   LV E/e' lateral: 17.0 LV SV:         75 LV SV Index:   35 LVOT Area:     2.84 cm LV IVRT:       120 msec  RIGHT VENTRICLE RV S prime:     11.70 cm/s TAPSE (M-mode): 3.2 cm LEFT ATRIUM             Index LA diam:        4.20 cm 1.94 cm/m LA Vol (A2C):   58.8 ml 27.17 ml/m LA Vol (A4C):   59.0 ml 27.27 ml/m LA Biplane Vol: 59.2 ml 27.36 ml/m  AORTIC VALVE AV Area (Vmax):    1.66 cm AV Area (Vmean):   1.74 cm AV Area (VTI):     1.72 cm AV Vmax:           188.00 cm/s AV Vmean:          139.000 cm/s AV VTI:            0.439 m AV Peak Grad:      14.1 mmHg AV Mean Grad:  8.0 mmHg LVOT Vmax:         110.00 cm/s LVOT Vmean:        85.400 cm/s LVOT VTI:          0.266 m LVOT/AV VTI ratio: 0.61  AORTA Ao Root diam: 2.70 cm Ao Asc diam:  3.60 cm MITRAL VALVE MV Area (PHT): 2.50 cm     SHUNTS MV Decel Time: 303 msec     Systemic VTI:  0.27 m MV E velocity: 96.30 cm/s   Systemic Diam: 1.90 cm MV A velocity: 129.00 cm/s MV E/A ratio:  0.75 Emeline Calender Electronically signed by Emeline Calender Signature Date/Time: 09/07/2024/4:54:06 PM    Final    DG  Ankle Complete Right Result Date: 09/06/2024 CLINICAL DATA:  Right ankle pain of unknown etiology. EXAM: RIGHT ANKLE - COMPLETE 3+ VIEW COMPARISON:  No prior comparison studies are available. FINDINGS: No evidence of acute fracture or dislocation. No focal bone lesion or bone destruction. Joint spaces are normal. Plantar and Achilles calcaneal spurs. Degenerative changes in the visualized intertarsal joints. Diffuse soft tissue swelling with infiltration in the subcutaneous fat likely representing edema. No radiopaque soft tissue foreign bodies or soft tissue gas. IMPRESSION: No acute bony abnormalities.  Diffuse soft tissue edema suggested. Electronically Signed   By: Elsie Gravely M.D.   On: 09/06/2024 19:04   DG Chest 1 View Result Date: 09/06/2024 CLINICAL DATA:  Cough EXAM: CHEST  1 VIEW COMPARISON:  02/22/2023 FINDINGS: Shallow inspiration. Cardiac enlargement with pulmonary vascular congestion and mild perihilar edema, progressing since prior study. No pleural effusion or pneumothorax. Mediastinal contours appear intact. Degenerative changes in the spine and shoulders. IMPRESSION: Cardiac enlargement with pulmonary vascular congestion and perihilar edema demonstrating progression since prior study. Electronically Signed   By: Elsie Gravely M.D.   On: 09/06/2024 19:02     Assessment/Plan:  AKI on CKD V The patient has had declining kidney function since December 2013, which has not recovered. Her most recent creatinine was 4.3 mg/dL in September 2024, with a baseline range of 3-5 mg/dL, but on this admission it was found to be 6.58 mg/dL, likely secondary to heart failure exacerbation with a cardiorenal picture. There is no evidence of infection, and the patient reports no change in urine output. She is eating and drinking well. BNP is elevated, and imaging findings are consistent with volume overload in the setting of chronic heart failure. UA shows proteinuria which is expected but negative  for urinary infections.I however worry about her refractory volume overload despite home lasix . She has no reliable urine out or weight measurement at home. - In/Out, Net negative 1.16 - Weight down by 1kg since admission  - Appear euvolemic,on RA - Scr of 6.62 <- 6.45 , BUN stable at 75, no uremic symptoms,no urgent HD at this time - Resume home as needed Lasix  given stable electrolytes - Discuss importance of  of renal diet - Schedule F/U appointment nephrology on discharge    Metabolic acidosis- RESOLVED      - Continue home bicarb  650 mg     - Will continue to monitor  IDA/ACD     - Continue IV iron  200 mg      - Will continue to monitor      - Ambulatory sats   4.HTN     - Continue Amlodipine  to 10 mg daily      - Continue Coreg  12.5     - No ARB/ACE   5: Secondary Hyperparathyroidism    -  Due to CKD   - Continue Calcitriol   -  Continue Calcium  acetate   Dispo: We are okay with discharging her today if she maintains adequate oxygen saturation during ambulation and receives her IV iron  infusion. She will follow up with Dr. Marlee at [facility]. If she desaturates with ambulation, we will keep her inpatient for continued diuresis. I suspect her shortness of breath is more related to iron  deficiency anemia rather than volume overload.   Drue Grow 09/08/2024, 8:17 AM

## 2024-09-08 NOTE — TOC Initial Note (Signed)
 Transition of Care [facility]) - Initial/Assessment Note    Patient Details  Name: Sarah Kline MRN: [id number] Date of Birth: 1965-12-30  [facility]) CM/SW Contact:    Waddell Barnie Rama, RN Phone Number: 09/08/2024, 11:30 AM  Clinical Narrative:                 From home with spouse, has PCP and insurance on file, states has no HH services in place at this time or DME at home.  She states she does not need a HHRN.   States family member will transport them home at [employer] and family is support system, states gets medications from [facility] and [employer] delivery.  Pta self ambulatory.   There are no ICM  needs identified  at this time.  Please place consult for ICM needs.    Expected Discharge Plan: Home/Self Care Barriers to Discharge: Continued Medical Work up   Patient Goals and CMS Choice Patient states their goals for this hospitalization and ongoing recovery are:: return home   Choice offered to / list presented to : NA      Expected Discharge Plan and Services In-house Referral: NA Discharge Planning Services: CM Consult Post Acute Care Choice: NA Living arrangements for the past 2 months: Single Family Home                 DME Arranged: N/A DME Agency: NA       HH Arranged: NA          Prior Living Arrangements/Services Living arrangements for the past 2 months: Single Family Home Lives with:: Spouse Patient language and need for interpreter reviewed:: Yes Do you feel safe going back to the place where you live?: Yes      Need for Family Participation in Patient Care: Yes (Comment) Care giver support system in place?: Yes (comment)   Criminal Activity/Legal Involvement Pertinent to Current Situation/Hospitalization: No - Comment as needed  Activities of Daily Living   ADL Screening (condition at time of admission) Independently performs ADLs?: Yes (appropriate for developmental age) Is the patient deaf or have difficulty  hearing?: No Does the patient have difficulty seeing, even when wearing glasses/contacts?: Yes Does the patient have difficulty concentrating, remembering, or making decisions?: No  Permission Sought/Granted Permission sought to share information with : Case Manager Permission granted to share information with : Yes, Verbal Permission Granted              Emotional Assessment Appearance:: Appears stated age Attitude/Demeanor/Rapport: Engaged Affect (typically observed): Appropriate Orientation: : Oriented to Self, Oriented to Place, Oriented to  Time, Oriented to Situation Alcohol / Substance Use: Not Applicable Psych Involvement: No (comment)  Admission diagnosis:  Acute on chronic diastolic congestive heart failure (HCC) [I50.33] Acute on chronic diastolic CHF (congestive heart failure) (HCC) [I50.33] Chronic kidney disease, unspecified CKD stage [N18.9] Patient Active Problem List   Diagnosis Date Noted   Obesity, Class III, BMI 40-49.9 (morbid obesity) (HCC) 09/07/2024   Chronic diastolic CHF (congestive heart failure) (HCC) 09/06/2024   Chronic rhinitis 09/16/2023   Hypertrophy of nasal turbinates 09/16/2023   Acute pulmonary edema (HCC) 02/23/2023   Acute renal failure 02/23/2023   Acute respiratory failure with hypoxia (HCC) 02/23/2023   Hypervolemia associated with renal insufficiency 02/21/2023   Sinus bradycardia 02/21/2023   Family history of breast cancer 05/21/2022   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 [place] [address] Phone: [phone] Fax: [phone]  [facility] - [place], [place] - [address] [place] [address] Phone: [phone] Fax: [phone]  [facility] [address] [place] [address] Phone: [phone] Fax: [phone]     Social Drivers of Health (SDOH) Social History: SDOH Screenings   Food Insecurity: Patient Declined (09/07/2024)  Housing: Low Risk  (09/07/2024)  Transportation Needs: No Transportation Needs (09/07/2024)  Utilities: Not At Risk (09/07/2024)  Depression (PHQ2-9): Low Risk  (03/31/2019)  Tobacco Use: Low Risk  (09/06/2024)   SDOH Interventions: Housing Interventions: Intervention Not Indicated Transportation Interventions: Intervention Not Indicated   Readmission Risk Interventions    09/08/2024   11:28 AM  Readmission Risk Prevention Plan  Transportation Screening Complete  PCP or Specialist Appt within 3-5 Days Complete  HRI or Home Care Consult Complete  Palliative Care Screening Not Applicable  Medication Review (RN Care Manager) Complete

## 2024-09-09 LAB — CBC
HCT: 25.1 % — ABNORMAL LOW (ref 36.0–46.0)
Hemoglobin: 8.3 g/dL — ABNORMAL LOW (ref 12.0–15.0)
MCH: 30.2 pg (ref 26.0–34.0)
MCHC: 33.1 g/dL (ref 30.0–36.0)
MCV: 91.3 fL (ref 80.0–100.0)
Platelets: 205 K/uL (ref 150–400)
RBC: 2.75 MIL/uL — ABNORMAL LOW (ref 3.87–5.11)
RDW: 13.9 % (ref 11.5–15.5)
WBC: 6.4 K/uL (ref 4.0–10.5)
nRBC: 0 % (ref 0.0–0.2)

## 2024-09-09 LAB — BASIC METABOLIC PANEL WITH GFR
Anion gap: 12 (ref 5–15)
BUN: 88 mg/dL — ABNORMAL HIGH (ref 6–20)
CO2: 23 mmol/L (ref 22–32)
Calcium: 8.3 mg/dL — ABNORMAL LOW (ref 8.9–10.3)
Chloride: 104 mmol/L (ref 98–111)
Creatinine, Ser: 7.2 mg/dL — ABNORMAL HIGH (ref 0.44–1.00)
GFR, Estimated: 6 mL/min — ABNORMAL LOW (ref >60)
Glucose, Bld: 84 mg/dL (ref 70–99)
Potassium: 4.3 mmol/L (ref 3.5–5.1)
Sodium: 139 mmol/L (ref 135–145)

## 2024-09-09 LAB — GLUCOSE, CAPILLARY
Glucose-Capillary: 86 mg/dL (ref 70–99)
Glucose-Capillary: 135 mg/dL — ABNORMAL HIGH (ref 70–99)
Glucose-Capillary: 139 mg/dL — ABNORMAL HIGH (ref 70–99)

## 2024-09-09 MED ORDER — CALCITRIOL 0.25 MCG PO CAPS: 0.2500 ug | ORAL_CAPSULE | ORAL | 0 refills | Status: AC

## 2024-09-09 MED ORDER — AMLODIPINE BESYLATE 10 MG PO TABS: 10.0000 mg | ORAL_TABLET | Freq: Every day | ORAL | 0 refills | Status: AC

## 2024-09-09 MED ORDER — FUROSEMIDE 40 MG PO TABS
40.0000 mg | ORAL_TABLET | Freq: Every day | ORAL | 0 refills | Status: AC | PRN
Start: 2024-09-09 — End: 2025-09-09

## 2024-09-09 NOTE — Progress Notes (Addendum)
 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  The patient reports twisting her right ankle today, resulting in pain and swelling. She initially sought care at an outpatient clinic, where she was noted to be hypoxic and referred to the ED. She describes gradually worsening dyspnea over days to weeks but denies cough, fever, chills, or prior lower extremity swelling until the ankle injury. She has a right upper extremity AV fistula ready for use and reports that her nephrologist, Dr. Marlee, recently informed her that her kidney function is declining.  On arrival, the patient was afebrile, tachypneic, with normal heart rate, elevated blood pressure, and oxygen saturation maintained on 2 L/min supplemental oxygen. Labs were notable for BUN 76 mg/dL, creatinine 3.41 mg/dL, hemoglobin 9.3 g/dL, BNP 327 pg/mL, and a normal WBC count. Right ankle radiographs showed no fracture or dislocation. Chest X-ray demonstrated cardiomegaly with pulmonary vascular congestion and perihilar edema.The patient was treated with IV furosemide , albuterol , and ipratropium. She is currently on 2 L of O2 satting at 98-100%.Nephrology was consulted for AKI on CKD V.  INTERVAL HISTORY  10/9 : Patient seen at bedside this morning. She endorse resolution of her ankle pain and shortness of breath. Reports she is better and asking when she can go home. Had no acute events overnight.   10/10: This morning, Sarah Kline appears well and pleasant. She continues to deny any chest pain or new symptoms. She remains on 2 L nasal cannula with good oxygen saturation.    Trend in Creatinine: Creatinine  Date/Time Value Ref Range Status  05/21/2022  12:29 PM 3.61 (HH) 0.44 - 1.00 mg/dL Final    Comment:    CRITICAL RESULT CALLED TO, READ BACK BY AND VERIFIED WITH: Larraine Curt, RN at 1330 by CHRISTELLA Rattler    Creat  Date/Time Value Ref Range Status  02/13/2017 10:08 AM 1.73 (H) 0.50 - 1.05 mg/dL Final    Comment:      For patients > or = 60 years of age: The upper reference limit for Creatinine is approximately 13% higher for people identified as African-American.     11/26/2016 08:58 AM 1.85 (H) 0.50 - 1.05 mg/dL Final    Comment:      For patients > or = 60 years of age: The upper reference limit for Creatinine is approximately 13% higher for people identified as African-American.     11/12/2016 10:40 AM 2.19 (H) 0.50 - 1.05 mg/dL Final    Comment:      For patients > or = 60 years of age: The upper reference limit for Creatinine is approximately 13% higher for people identified as African-American.     11/19/2012 10:39 AM 0.91 0.50 - 1.10 mg/dL Final  87/86/7986 96:40 PM 0.74 0.50 - 1.10 mg/dL Final   Creatinine, Ser  Date/Time Value Ref Range Status  09/09/2024 02:43 AM 7.20 (H) 0.44 - 1.00 mg/dL Final  89/90/7974 97:51 AM 6.62 (H) 0.44 - 1.00 mg/dL Final  89/91/7974 95:68 AM 6.45 (H) 0.44 - 1.00 mg/dL Final  89/92/7974 91:85 PM 6.58 (H) 0.44 - 1.00 mg/dL Final  90/96/7975 89:42 AM 4.36 (H) 0.44 - 1.00 mg/dL Final  96/70/7975 87:64 PM 5.38 (H) 0.44 - 1.00 mg/dL Final  96/70/7975  07:13 AM 5.62 (H) 0.44 - 1.00 mg/dL Final  96/71/7975 88:73 PM 5.44 (H) 0.44 - 1.00 mg/dL Final  96/71/7975 87:84 AM 5.16 (H) 0.44 - 1.00 mg/dL Final  96/72/7975 87:71 AM 4.77 (H) 0.44 - 1.00 mg/dL Final  96/73/7975 87:87 AM 4.74 (H) 0.44 - 1.00 mg/dL Final  96/74/7975 87:66 AM 5.21 (H) 0.44 - 1.00 mg/dL Final  96/75/7975 92:75 AM 5.33 (H) 0.44 - 1.00 mg/dL Final  96/76/7975 91:47 AM 4.76 (H) 0.44 - 1.00 mg/dL Final  96/77/7975 88:74 AM 4.10 (H) 0.44 - 1.00 mg/dL Final  96/80/7975 88:59 AM 3.74 (H) 0.44 - 1.00 mg/dL Final  95/95/7976  93:85 AM 3.90 (H) 0.44 - 1.00 mg/dL Final  88/87/7979 88:85 AM 2.59 (H) 0.57 - 1.00 mg/dL Final  93/79/7980 89:71 AM 2.66 (H) 0.57 - 1.00 mg/dL Final  88/85/7981 89:64 AM 2.07 (H) 0.57 - 1.00 mg/dL Final  88/97/7981 96:74 PM 2.64 (H) 0.57 - 1.00 mg/dL Final  94/76/7981 96:66 PM 2.05 (H) 0.57 - 1.00 mg/dL Final  97/79/7981 95:49 AM 1.79 (H) 0.44 - 1.00 mg/dL Final  97/80/7981 95:53 PM 1.87 (H) 0.44 - 1.00 mg/dL Final  93/93/7983 92:76 PM 2.12 (H) 0.44 - 1.00 mg/dL Final  91/69/7986 89:93 PM 0.69 0.50 - 1.10 mg/dL Final    PMH:   Past Medical History:  Diagnosis Date   Acute renal failure superimposed on stage 5 chronic kidney disease, not on chronic dialysis, unspecified acute renal failure type (HCC) 02/21/2023   Anemia    Blind    Both eyes   Breast cancer (HCC)    Chronic kidney disease    Diabetes mellitus    Hypertension    Personal history of radiation therapy 07/2022   Left   Sickle cell disease, type S (HCC)    Trait   Stroke (HCC) 2017   TIA    PSH:   Past Surgical History:  Procedure Laterality Date   AV FISTULA PLACEMENT Right 03/04/2022   Procedure: RIGHT ARM BRACHIOCEPHALIC FISTULA CREATION;  Surgeon: Lanis Fonda BRAVO, MD;  Location: [facility] OR;  Service: Vascular;  Laterality: Right;  PERIPHERAL NERVE BLOCK   BREAST LUMPECTOMY WITH RADIOACTIVE SEED LOCALIZATION Left 05/27/2022   Procedure: LEFT BREAST LUMPECTOMY WITH RADIOACTIVE SEED LOCALIZATION;  Surgeon: Belinda Cough, MD;  Location: [facility] OR;  Service: General;  Laterality: Left;  LMA   COLONOSCOPY     EYE SURGERY Left    retinal detachment repair 2017   EYE SURGERY Left    ? type of surgery to repair blood vessels 2018   GANGLION CYST EXCISION     Left dorsal wrist   NASAL SEPTOPLASTY W/ TURBINOPLASTY N/A 02/20/2023   Procedure: NASAL SEPTOPLASTY WITH BILATERAL TURBINATE REDUCTION;  Surgeon: Karis Clunes, MD;  Location: [facility] OR;  Service: ENT;  Laterality: N/A;   RETINAL DETACHMENT SURGERY Left    2017, [place]     Allergies: No Known Allergies  Medications:   Prior to Admission medications   Medication Sig Start Date End Date Taking? Authorizing Provider  amLODipine  (NORVASC ) 5 MG tablet Take 1 tablet (5 mg total) by mouth daily. 02/28/23 11/30/24 Yes Sharl Gee, MD  atorvastatin  (LIPITOR ) 20 MG tablet Take 20 mg by mouth at bedtime. 08/03/24  Yes [provider]  calcitRIOL  (ROCALTROL ) 0.25 MCG capsule TAKE 1 CAPSULE EVERY DAY 08/26/21  Yes Simmons-Robinson, Makiera, MD  calcium  acetate (PHOSLO) 667 MG capsule Take 667 mg by mouth 3 (three) times daily. 07/20/24  Yes [provider]  carvedilol  (  COREG ) 12.5 MG tablet Take 12.5 mg by mouth 2 (two) times daily. 08/05/24  Yes [provider]  LANTUS SOLOSTAR 100 UNIT/ML Solostar Pen 10 Units at bedtime. 06/21/24  Yes [provider]  losartan (COZAAR) 50 MG tablet Take 50 mg by mouth at bedtime. 06/23/24  Yes [provider]  sodium bicarbonate  650 MG tablet Take 1,300 mg by mouth 2 (two) times daily. 06/02/18  Yes [provider]  tamoxifen  (NOLVADEX ) 10 MG tablet TAKE 1 TABLET EVERY DAY 02/29/24  Yes Gudena, Vinay, MD    Inpatient medications:  amLODipine   10 mg Oral Daily   atorvastatin   40 mg Oral QHS   calcitRIOL   0.25 mcg Oral QODAY   carvedilol   12.5 mg Oral BID   heparin   5,000 Units Subcutaneous Q8H   insulin  aspart  0-5 Units Subcutaneous QHS   insulin  aspart  0-6 Units Subcutaneous TID WC   sodium bicarbonate   1,300 mg Oral BID   sodium chloride  flush  3 mL Intravenous Q12H   tamoxifen   10 mg Oral Daily    Discontinued Meds:   Medications Discontinued During This Encounter  Medication Reason   atorvastatin  (LIPITOR ) 40 MG tablet Dose change   Blood Glucose Monitoring Suppl (PRODIGY AUTOCODE BLOOD GLUCOSE) w/Device KIT    iron  polysaccharides (NIFEREX) 150 MG capsule Discontinued by provider   furosemide  (LASIX ) 40 MG tablet Discontinued by provider   furosemide  (LASIX ) injection 40  mg    amLODipine  (NORVASC ) tablet 5 mg    furosemide  (LASIX ) 10 MG/ML solution 40 mg    furosemide  (LASIX ) 10 MG/ML solution 60 mg    furosemide  (LASIX ) 10 MG/ML solution 60 mg    iron  sucrose (VENOFER) 200 mg in sodium chloride  0.9 % 100 mL IVPB Entry Error   fentaNYL  (SUBLIMAZE ) injection 25-50 mcg Duplicate   furosemide  (LASIX ) injection 60 mg     Social History:  reports that she has never smoked. She has never used smokeless tobacco. She reports that she does not currently use alcohol after a past usage of about 2.0 standard drinks of alcohol per week. She reports that she does not use drugs.  Family History:   Family History  Problem Relation Age of Onset   Asthma Mother    COPD Mother    Hypertension Mother    Stroke Mother    Breast cancer Sister 60   Diabetes Daughter    Diabetes Son      Weight change: 2.8 kg  Intake/Output Summary (Last 24 hours) at 09/09/2024 0915 Last data filed at 09/09/2024 0507 Gross per 24 hour  Intake 108.7 ml  Output 600 ml  Net -491.3 ml   BP (!) 161/70 (BP Location: Left Arm)   Pulse 67   Temp 97.6 F (36.4 C) (Oral)   Resp 20   Ht 5' 4 (1.626 m)   Wt 115.2 kg   LMP 04/28/2018   SpO2 98%   BMI 43.59 kg/m  Vitals:   09/08/24 2355 09/09/24 0450 09/09/24 0500 09/09/24 0740  BP: 138/71 (!) 142/61  (!) 161/70  Pulse: 65 65  67  Resp: 16   20  Temp: 98.7 F (37.1 C) 97.8 F (36.6 C)  97.6 F (36.4 C)  TempSrc: Oral Temporal  Oral  SpO2: 99% 95%  98%  Weight:   115.2 kg   Height:         General appearance: alert, cooperative, and no distress  Eyes: Legally blind in both eyes Resp: clear to  auscultation bilaterally, sating well on 2L Cardio: regular rate and rhythm and S1, S2 normal Extremities: AV fistula on right arm with palpable thrills. Warm and dry  Pulses: 2+ and symmetric Skin: Skin color, texture, turgor normal. No rashes or lesions  Labs: Basic Metabolic Panel: Recent Labs  Lab 09/06/24 2014  09/07/24 0431 09/08/24 0248 09/09/24 0243  NA 143 140 142 139  K 4.8 3.8 4.1 4.3  CL 114* 108 108 104  CO2 21* 21* 22 23  GLUCOSE 114* 215* 148* 84  BUN 76* 75* 75* 88*  CREATININE 6.58* 6.45* 6.62* 7.20*  ALBUMIN 3.2*  --   --   --   CALCIUM  8.8* 8.5* 8.5* 8.3*  PHOS  --  4.5  --   --    Liver Function Tests: Recent Labs  Lab 09/06/24 2014  AST 21  ALT 28  ALKPHOS 71  BILITOT 1.3*  PROT 6.0*  ALBUMIN 3.2*   No results for input(s): LIPASE, AMYLASE in the last 168 hours. No results for input(s): AMMONIA in the last 168 hours. CBC: Recent Labs  Lab 09/06/24 2014 09/07/24 0431 09/08/24 0248 09/09/24 0243  WBC 7.8 6.6 6.2 6.4  NEUTROABS 6.0  --   --   --   HGB 9.3* 8.2* 8.2* 8.3*  HCT 30.8* 25.9* 25.3* 25.1*  MCV 98.1 94.5 92.0 91.3  PLT 198 190 178 205   PT/INR: @LABRCNTIP (inr:5) Cardiac Enzymes: )No results for input(s): CKTOTAL, CKMB, CKMBINDEX, TROPONINI in the last 168 hours. CBG: Recent Labs  Lab 09/08/24 0554 09/08/24 1214 09/08/24 1747 09/08/24 2117 09/09/24 0612  GLUCAP 128* 158* 191* 201* 86    Iron  Studies:  Recent Labs  Lab 09/07/24 0431  IRON  23*  TIBC 309  FERRITIN 96    Xrays/Other Studies: ECHOCARDIOGRAM COMPLETE Result Date: 09/07/2024    ECHOCARDIOGRAM REPORT   Patient Name:   KYNDRA CONDRON Date of Exam: 09/07/2024 Medical Rec #:  [id number]          Height:       65.0 in Accession #:    [id number]         Weight:       247.0 lb Date of Birth:  1966-08-06         BSA:          2.164 m Patient Age:    57 years           BP:           189/55 mmHg Patient Gender: F                  HR:           66 bpm. Exam Location:  Inpatient Procedure: 2D Echo, Cardiac Doppler, Color Doppler and Intracardiac            Opacification Agent (Both Spectral and Color Flow Doppler were            utilized during procedure). Indications:    I50.31 Acute diastolic (congestive) heart failure  History:        Patient has prior history of  Echocardiogram examinations, most                 recent 02/22/2023. CKD V; Risk Factors:Hypertension, Diabetes and                 Dyslipidemia.  Sonographer:    Damien Senior RDCS Referring Phys: [id number] TIMOTHY S OPYD  Sonographer Comments: Technically difficult study due  to patient body habitus IMPRESSIONS  1. Left ventricular ejection fraction, by estimation, is 65 to 70%. The left ventricle has normal function. The left ventricle has no regional wall motion abnormalities. There is moderate concentric left ventricular hypertrophy. Left ventricular diastolic parameters are consistent with Grade I diastolic dysfunction (impaired relaxation).  2. Right ventricular systolic function is normal. The right ventricular size is not well visualized. Tricuspid regurgitation signal is inadequate for assessing PA pressure.  3. Left atrial size was mildly dilated.  4. The mitral valve is grossly normal. No evidence of mitral valve regurgitation.  5. The aortic valve has an indeterminant number of cusps. Aortic valve regurgitation is trivial.  6. The inferior vena cava is normal in size with <50% respiratory variability, suggesting right atrial pressure of 8 mmHg. Comparison(s): A prior study was performed on 02/22/2023. There was grade 2 diastolic dysfunction with elevated LVEDP. NO other significant change. FINDINGS  Left Ventricle: Left ventricular ejection fraction, by estimation, is 65 to 70%. The left ventricle has normal function. The left ventricle has no regional wall motion abnormalities. Definity contrast agent was given IV to delineate the left ventricular  endocardial borders. The left ventricular internal cavity size was normal in size. There is moderate concentric left ventricular hypertrophy. Left ventricular diastolic parameters are consistent with Grade I diastolic dysfunction (impaired relaxation). Normal left ventricular filling pressure. Right Ventricle: The right ventricular size is not well visualized.  Right vetricular wall thickness was not well visualized. Right ventricular systolic function is normal. Tricuspid regurgitation signal is inadequate for assessing PA pressure. Left Atrium: Left atrial size was mildly dilated. Right Atrium: Right atrial size was not well visualized. Pericardium: Trivial pericardial effusion is present. Mitral Valve: The mitral valve is grossly normal. No evidence of mitral valve regurgitation. Tricuspid Valve: The tricuspid valve is grossly normal. Tricuspid valve regurgitation is trivial. Aortic Valve: The aortic valve has an indeterminant number of cusps. Aortic valve regurgitation is trivial. Aortic valve mean gradient measures 8.0 mmHg. Aortic valve peak gradient measures 14.1 mmHg. Aortic valve area, by VTI measures 1.72 cm. Pulmonic Valve: The pulmonic valve was grossly normal. Pulmonic valve regurgitation is trivial. Aorta: The aortic root and ascending aorta are structurally normal, with no evidence of dilitation. Venous: The inferior vena cava is normal in size with less than 50% respiratory variability, suggesting right atrial pressure of 8 mmHg. IAS/Shunts: The atrial septum is grossly normal.  LEFT VENTRICLE PLAX 2D LVIDd:         3.40 cm   Diastology LVIDs:         2.20 cm   LV e' medial:    5.33 cm/s LV PW:         1.60 cm   LV E/e' medial:  18.1 LV IVS:        1.50 cm   LV e' lateral:   5.66 cm/s LVOT diam:     1.90 cm   LV E/e' lateral: 17.0 LV SV:         75 LV SV Index:   35 LVOT Area:     2.84 cm LV IVRT:       120 msec  RIGHT VENTRICLE RV S prime:     11.70 cm/s TAPSE (M-mode): 3.2 cm LEFT ATRIUM             Index LA diam:        4.20 cm 1.94 cm/m LA Vol (A2C):   58.8 ml 27.17 ml/m LA Vol (A4C):  59.0 ml 27.27 ml/m LA Biplane Vol: 59.2 ml 27.36 ml/m  AORTIC VALVE AV Area (Vmax):    1.66 cm AV Area (Vmean):   1.74 cm AV Area (VTI):     1.72 cm AV Vmax:           188.00 cm/s AV Vmean:          139.000 cm/s AV VTI:            0.439 m AV Peak Grad:      14.1  mmHg AV Mean Grad:      8.0 mmHg LVOT Vmax:         110.00 cm/s LVOT Vmean:        85.400 cm/s LVOT VTI:          0.266 m LVOT/AV VTI ratio: 0.61  AORTA Ao Root diam: 2.70 cm Ao Asc diam:  3.60 cm MITRAL VALVE MV Area (PHT): 2.50 cm     SHUNTS MV Decel Time: 303 msec     Systemic VTI:  0.27 m MV E velocity: 96.30 cm/s   Systemic Diam: 1.90 cm MV A velocity: 129.00 cm/s MV E/A ratio:  0.75 Emeline Calender Electronically signed by Emeline Calender Signature Date/Time: 09/07/2024/4:54:06 PM    Final     Assessment/Plan:  AKI on CKD V     - Creatinine increased to 7.20 from 6.58 on admission, likely secondary to intravascular volume depletion.     - Hold furosemide       - Electrolytes within normal limits.     - Will reassess with ambulatory oxygen saturations today.     - Please refer to disposition plan below.  Metabolic acidosis- RESOLVED      - Continue home bicarb  650 mg     - Will continue to monitor  IDA/ACD     - Hgb 8.3 this morning,no avert sign of bleeding      - Trend CBC  4.HTN     - Continue Amlodipine  to 10 mg daily      - Continue Coreg  12.5     - No ARB/ACE   5: Secondary Hyperparathyroidism    - Due to CKD   - Continue Calcitriol   -  Continue Calcium  acetate   Dispo: Holding diuresis today in the setting of rising serum creatinine and clinical euvolemia. Will assess ambulatory oxygen saturations, and patient may be discharged if no significant desaturation is observed. If hypoxia persists, alternative etiologies for dyspnea should be considered, as volume overload-related hypoxemia is less likely.  Tynasia Mccaul 09/09/2024, 9:15 AM

## 2024-09-09 NOTE — Discharge Instructions (Addendum)
 You were prescribed Lasix  (fluid pill). Take 40 mg by mouth as needed for weight gain or swelling. If symptoms do not improve, may increase to 80 mg as needed. Keep a daily log of your blood pressure, ideally measured around the same time every day in the morning and at night, and take that to your next appointment with your PCP and/or nephrologist Hold Losartan (blood pressure medication) for now until you are instructed by nephrology to resume it Follow up with Dr. Marlee within 1 week of discharge Follow up with your PCP within 1 week of discharge Discuss options for weight loss with your PCP Ensure adherence to a sodium- and fluid-restricted diet (see attachment)

## 2024-09-09 NOTE — Discharge Summary (Signed)
 Physician Discharge Summary   Patient: Sarah Kline MRN: [id number] DOB: [birth year]  Admit date:     09/06/2024  Discharge date: 09/09/2024  Discharge Physician: Duffy Al-Sultani   PCP: Campbell Reynolds, NP   Recommendations at discharge:   Take Lasix  40 mg for weight gain of 3 pounds in 1 day, 5 pounds in 1 week, worsening shortness of breath, or worsening lower extremity edema.  Can take another dose if not responsive to the first. Follow-up with Dr. Marlee with nephrology Follow-up with PCP within 1 week to review discharge medications and repeat lab work  Discharge Diagnoses: Principal Problem:   Hypervolemia associated with renal insufficiency Active Problems:   Acute renal failure superimposed on stage 5 chronic kidney disease, not on chronic dialysis (HCC)   Acute pulmonary edema (HCC)   Acute hypoxic respiratory failure (HCC)   Diabetes mellitus with hyperglycemia (HCC)   Essential hypertension   Legally blind   Anemia of chronic renal failure, stage 5 (HCC)   Chronic diastolic CHF (congestive heart failure) (HCC)   Obesity, Class III, BMI 40-49.9 (morbid obesity) [facility])   Hospital Course: CC: right ankle injury, SOB  HPI:  Sarah Kline is a 58 y.o. 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   Patient reported twisting her ankle on the day of admission, now painful and swollen. Was evaluated in clinic where she was noted to be hypoxic and sent to the ED. she admitted to insidiously worsening dyspnea over the course of days to weeks but denies any significant cough, fevers, chills, or pedal edema until the injury to her right ankle.  She has undergone placement of a RUE fistula and that has matured.  She follows with Dr. Marlee with nephrology.   ED Course: In the ED, she  was found to be afebrile, maintaining appropriate SpO2 on 2 L nasal cannula, mildly tachypneic, normal HR, elevated BP.  Labs were notable for BUN 76, creatinine 6.58, normal WBC, Hgb 9.3, and BNP 672.  X-ray of right ankle was negative for acute fracture or dislocation.  CXR was concerning for cardiomegaly and pulmonary vascular congestion with perihilar edema.   Patient was treated in the ED with IV Lasix , albuterol , and Atrovent.  Significant Events: Admitted 09/06/2024 for acute on chronic diastolic CHF, acute hypoxic respiratory failure, AKI on CKD stage 5  Admission Labs: WBC 7.8, HgB 9.3 plt 198 Na 143, K 4.8, CO2 of 21, BUN 76, Scr 6.58 T. Prot 6.0, alb 3.2, AST 21, ALT 28, alk phos 71, T. Bili 1.3 BNP 672 Fe 23, TIBC 309, %sat 7, ferritin 96 Vit B12 of 423   Assessment and Plan:  #Acute hypoxic respite failure #Acute pulmonary edema # Hypervolemia associated with renal insufficiency #AKI on CKD stage V #HFpEF (LVEF 65 to 70%) Nephrology evaluated patient, noted acute worsening of previously noted declining kidney function most likely attributable to heart failure exacerbation and cardiorenal disease.  Favored management with diuresis as opposed to initiation of hemodialysis given no significant electrolyte abnormalities, no symptoms of uremia and only mild acidosis.  She underwent an echocardiogram on 09/07/2024 which showed an LVEF 65 to 70%, no RWMA, G1DD, normal RV function, no significant valvular abnormalities. She was diuresed with IV Lasix  over 3.5 days with significant improvement in her oxygen requirement, which was ultimately weaned down to room  air.  On the day of discharge, ambulatory saturations were obtained, and patient was noted to maintain an SpO2 greater than 90% while ambulating on room air.  In conjunction with nephrology's recommendation, the patient was discharged with instructions to take up to 2 doses of p.o. Lasix  40 mg for weight gain of 3 pounds in 1 day, 5  pounds in 1 week, worsening shortness of breath, or worsening lower extremity edema.  She was instructed to hold losartan on discharge until she is seen in nephrology clinic.  She is to follow-up with Dr. Marlee within a few weeks of discharge.  She was also instructed to follow-up with her PCP within 1 week for repeat lab work.  #IDA  anemia of CKD Received 3 doses of IV iron  200 mg  #Hypertension Amlodipine  was increased to 10 mg daily  #Class Obesity III (BMI Body mass index is 43.59 kg/m. kg/m) - Obesity is clinically significant and contributes to T2DM, HTN, and HFpEF.  - Discussed lifestyle modification including dietary changes, regular physical activity, and weight reduction strategies. Will continue to monitor weight and BMI during hospitalization.        Consultants: Nephrology Procedures performed: None  Disposition: Home Diet recommendation: Renal diet, Fluid restriction to < 2L/day, salt restriction to < 2g day  DISCHARGE MEDICATION: Allergies as of 09/09/2024   No Known Allergies      Medication List     PAUSE taking these medications    losartan 50 MG tablet Wait to take this until your doctor or other care provider tells you to start again. Commonly known as: COZAAR Take 50 mg by mouth at bedtime.       TAKE these medications    amLODipine  10 MG tablet Commonly known as: NORVASC  Take 1 tablet (10 mg total) by mouth daily. Start taking on: September 10, 2024 What changed:  medication strength how much to take   atorvastatin  20 MG tablet Commonly known as: LIPITOR  Take 20 mg by mouth at bedtime.   calcitRIOL  0.25 MCG capsule Commonly known as: ROCALTROL  Take 1 capsule (0.25 mcg total) by mouth every other day. Start taking on: September 11, 2024 What changed: when to take this   calcium  acetate 667 MG capsule Commonly known as: PHOSLO Take 667 mg by mouth 3 (three) times daily.   carvedilol  12.5 MG tablet Commonly known as: COREG  Take  12.5 mg by mouth 2 (two) times daily.   furosemide  40 MG tablet Commonly known as: Lasix  Take 1 tablet (40 mg total) by mouth daily as needed for fluid or edema. Take 40 mg by mouth as needed for weight gain or swelling. If symptoms do not improve, may increase to 80 mg as needed.   Lantus SoloStar 100 UNIT/ML Solostar Pen Generic drug: insulin  glargine 10 Units at bedtime.   sodium bicarbonate  650 MG tablet Take 1,300 mg by mouth 2 (two) times daily.   tamoxifen  10 MG tablet Commonly known as: NOLVADEX  TAKE 1 TABLET EVERY DAY        Follow-up Information     Marlee Bernardino NOVAK, MD. Schedule an appointment as soon as possible for a visit in 1 week(s).   Specialty: Nephrology Contact information: [address] [place] [address] [phone]         Campbell Reynolds, NP Follow up.   Contact information: [address] [place] [address] [phone]                Discharge Exam: Fredricka Weights  09/07/24 1700 09/08/24 0603 09/09/24 0500  Weight: 112.4 kg 111.7 kg 115.2 kg   Physical Exam Constitutional:      General: She is not in acute distress.    Appearance: She is not ill-appearing.  HENT:     Mouth/Throat:     Mouth: Mucous membranes are moist.  Cardiovascular:     Rate and Rhythm: Normal rate and regular rhythm.     Heart sounds: Normal heart sounds. No murmur heard.    Arteriovenous access: Right arteriovenous access is present.  Pulmonary:     Effort: Pulmonary effort is normal. No respiratory distress.     Breath sounds: Normal breath sounds. No wheezing.  Abdominal:     Palpations: Abdomen is soft.     Tenderness: There is no abdominal tenderness.  Musculoskeletal:     Right lower leg: Edema (trace) present.     Left lower leg: Edema (trace) present.  Skin:    General: Skin is warm and dry.     Capillary Refill: Capillary refill takes less than 2 seconds.  Neurological:     Mental Status: She is alert and oriented to person,  place, and time. Mental status is at baseline.     Condition at discharge: good  The results of significant diagnostics from this hospitalization (including imaging, microbiology, ancillary and laboratory) are listed below for reference.   Imaging Studies: ECHOCARDIOGRAM COMPLETE Result Date: 09/07/2024    ECHOCARDIOGRAM REPORT   Patient Name:   ISHANA BLADES Date of Exam: 09/07/2024 Medical Rec #:  [id number]          Height:       65.0 in Accession #:    [id number]         Weight:       247.0 lb Date of Birth:  28-Mar-1966         BSA:          2.164 m Patient Age:    57 years           BP:           189/55 mmHg Patient Gender: F                  HR:           66 bpm. Exam Location:  Inpatient Procedure: 2D Echo, Cardiac Doppler, Color Doppler and Intracardiac            Opacification Agent (Both Spectral and Color Flow Doppler were            utilized during procedure). Indications:    I50.31 Acute diastolic (congestive) heart failure  History:        Patient has prior history of Echocardiogram examinations, most                 recent 02/22/2023. CKD V; Risk Factors:Hypertension, Diabetes and                 Dyslipidemia.  Sonographer:    Damien Senior RDCS Referring Phys: [id number] TIMOTHY S OPYD  Sonographer Comments: Technically difficult study due to patient body habitus IMPRESSIONS  1. Left ventricular ejection fraction, by estimation, is 65 to 70%. The left ventricle has normal function. The left ventricle has no regional wall motion abnormalities. There is moderate concentric left ventricular hypertrophy. Left ventricular diastolic parameters are consistent with Grade I diastolic dysfunction (impaired relaxation).  2. Right ventricular systolic function is normal. The right ventricular size is not well visualized. Tricuspid  regurgitation signal is inadequate for assessing PA pressure.  3. Left atrial size was mildly dilated.  4. The mitral valve is grossly normal. No evidence of mitral valve  regurgitation.  5. The aortic valve has an indeterminant number of cusps. Aortic valve regurgitation is trivial.  6. The inferior vena cava is normal in size with <50% respiratory variability, suggesting right atrial pressure of 8 mmHg. Comparison(s): A prior study was performed on 02/22/2023. There was grade 2 diastolic dysfunction with elevated LVEDP. NO other significant change. FINDINGS  Left Ventricle: Left ventricular ejection fraction, by estimation, is 65 to 70%. The left ventricle has normal function. The left ventricle has no regional wall motion abnormalities. Definity contrast agent was given IV to delineate the left ventricular  endocardial borders. The left ventricular internal cavity size was normal in size. There is moderate concentric left ventricular hypertrophy. Left ventricular diastolic parameters are consistent with Grade I diastolic dysfunction (impaired relaxation). Normal left ventricular filling pressure. Right Ventricle: The right ventricular size is not well visualized. Right vetricular wall thickness was not well visualized. Right ventricular systolic function is normal. Tricuspid regurgitation signal is inadequate for assessing PA pressure. Left Atrium: Left atrial size was mildly dilated. Right Atrium: Right atrial size was not well visualized. Pericardium: Trivial pericardial effusion is present. Mitral Valve: The mitral valve is grossly normal. No evidence of mitral valve regurgitation. Tricuspid Valve: The tricuspid valve is grossly normal. Tricuspid valve regurgitation is trivial. Aortic Valve: The aortic valve has an indeterminant number of cusps. Aortic valve regurgitation is trivial. Aortic valve mean gradient measures 8.0 mmHg. Aortic valve peak gradient measures 14.1 mmHg. Aortic valve area, by VTI measures 1.72 cm. Pulmonic Valve: The pulmonic valve was grossly normal. Pulmonic valve regurgitation is trivial. Aorta: The aortic root and ascending aorta are structurally  normal, with no evidence of dilitation. Venous: The inferior vena cava is normal in size with less than 50% respiratory variability, suggesting right atrial pressure of 8 mmHg. IAS/Shunts: The atrial septum is grossly normal.  LEFT VENTRICLE PLAX 2D LVIDd:         3.40 cm   Diastology LVIDs:         2.20 cm   LV e' medial:    5.33 cm/s LV PW:         1.60 cm   LV E/e' medial:  18.1 LV IVS:        1.50 cm   LV e' lateral:   5.66 cm/s LVOT diam:     1.90 cm   LV E/e' lateral: 17.0 LV SV:         75 LV SV Index:   35 LVOT Area:     2.84 cm LV IVRT:       120 msec  RIGHT VENTRICLE RV S prime:     11.70 cm/s TAPSE (M-mode): 3.2 cm LEFT ATRIUM             Index LA diam:        4.20 cm 1.94 cm/m LA Vol (A2C):   58.8 ml 27.17 ml/m LA Vol (A4C):   59.0 ml 27.27 ml/m LA Biplane Vol: 59.2 ml 27.36 ml/m  AORTIC VALVE AV Area (Vmax):    1.66 cm AV Area (Vmean):   1.74 cm AV Area (VTI):     1.72 cm AV Vmax:           188.00 cm/s AV Vmean:          139.000 cm/s AV VTI:  0.439 m AV Peak Grad:      14.1 mmHg AV Mean Grad:      8.0 mmHg LVOT Vmax:         110.00 cm/s LVOT Vmean:        85.400 cm/s LVOT VTI:          0.266 m LVOT/AV VTI ratio: 0.61  AORTA Ao Root diam: 2.70 cm Ao Asc diam:  3.60 cm MITRAL VALVE MV Area (PHT): 2.50 cm     SHUNTS MV Decel Time: 303 msec     Systemic VTI:  0.27 m MV E velocity: 96.30 cm/s   Systemic Diam: 1.90 cm MV A velocity: 129.00 cm/s MV E/A ratio:  0.75 Emeline Calender Electronically signed by Emeline Calender Signature Date/Time: 09/07/2024/4:54:06 PM    Final    DG Ankle Complete Right Result Date: 09/06/2024 CLINICAL DATA:  Right ankle pain of unknown etiology. EXAM: RIGHT ANKLE - COMPLETE 3+ VIEW COMPARISON:  No prior comparison studies are available. FINDINGS: No evidence of acute fracture or dislocation. No focal bone lesion or bone destruction. Joint spaces are normal. Plantar and Achilles calcaneal spurs. Degenerative changes in the visualized intertarsal joints. Diffuse soft  tissue swelling with infiltration in the subcutaneous fat likely representing edema. No radiopaque soft tissue foreign bodies or soft tissue gas. IMPRESSION: No acute bony abnormalities.  Diffuse soft tissue edema suggested. Electronically Signed   By: Elsie Gravely M.D.   On: 09/06/2024 19:04   DG Chest 1 View Result Date: 09/06/2024 CLINICAL DATA:  Cough EXAM: CHEST  1 VIEW COMPARISON:  02/22/2023 FINDINGS: Shallow inspiration. Cardiac enlargement with pulmonary vascular congestion and mild perihilar edema, progressing since prior study. No pleural effusion or pneumothorax. Mediastinal contours appear intact. Degenerative changes in the spine and shoulders. IMPRESSION: Cardiac enlargement with pulmonary vascular congestion and perihilar edema demonstrating progression since prior study. Electronically Signed   By: Elsie Gravely M.D.   On: 09/06/2024 19:02    Microbiology: No results found for this or any previous visit.  Labs: CBC: Recent Labs  Lab 09/06/24 2014 09/07/24 0431 09/08/24 0248 09/09/24 0243  WBC 7.8 6.6 6.2 6.4  NEUTROABS 6.0  --   --   --   HGB 9.3* 8.2* 8.2* 8.3*  HCT 30.8* 25.9* 25.3* 25.1*  MCV 98.1 94.5 92.0 91.3  PLT 198 190 178 205   Basic Metabolic Panel: Recent Labs  Lab 09/06/24 2014 09/07/24 0431 09/08/24 0248 09/09/24 0243  NA 143 140 142 139  K 4.8 3.8 4.1 4.3  CL 114* 108 108 104  CO2 21* 21* 22 23  GLUCOSE 114* 215* 148* 84  BUN 76* 75* 75* 88*  CREATININE 6.58* 6.45* 6.62* 7.20*  CALCIUM  8.8* 8.5* 8.5* 8.3*  MG  --  2.0  --   --   PHOS  --  4.5  --   --    Liver Function Tests: Recent Labs  Lab 09/06/24 2014  AST 21  ALT 28  ALKPHOS 71  BILITOT 1.3*  PROT 6.0*  ALBUMIN 3.2*   CBG: Recent Labs  Lab 09/08/24 1747 09/08/24 2117 09/09/24 0612 09/09/24 1120 09/09/24 1557  GLUCAP 191* 201* 86 135* 139*    Discharge time spent: 45 minutes  Signed: Duffy Larch, MD [facility] 09/09/2024

## 2024-09-09 NOTE — Progress Notes (Signed)
 SATURATION QUALIFICATIONS: (This note is used to comply with regulatory documentation for home oxygen)  Patient Saturations on Room Air at Rest = 92%  Patient Saturations on Room Air while Ambulating = 90-93%  Patient Saturations on RA Liters of oxygen while Ambulating = 91%  Please briefly explain why patient needs home oxygen: Pt maintained O2 Sat above 90% ambulating on RA. 1L placed after ambulation d/t SOB. O2 Sat. Remained above 90% and pt was able to come off O2 shortly after sitting.

## 2024-09-09 NOTE — Plan of Care (Signed)
  Problem: Education: Goal: Ability to describe self-care measures that may prevent or decrease complications (Diabetes Survival Skills Education) will improve Outcome: Completed/Met Goal: Individualized Educational Video(s) Outcome: Completed/Met   Problem: Coping: Goal: Ability to adjust to condition or change in health will improve Outcome: Completed/Met   Problem: Fluid Volume: Goal: Ability to maintain a balanced intake and output will improve Outcome: Completed/Met   Problem: Health Behavior/Discharge Planning: Goal: Ability to identify and utilize available resources and services will improve Outcome: Completed/Met Goal: Ability to manage health-related needs will improve Outcome: Completed/Met

## 2024-09-21 ENCOUNTER — Other Ambulatory Visit: Payer: Self-pay

## 2024-09-21 ENCOUNTER — Emergency Department (HOSPITAL_COMMUNITY)

## 2024-09-21 ENCOUNTER — Encounter (HOSPITAL_COMMUNITY): Payer: Self-pay | Admitting: Emergency Medicine

## 2024-09-21 ENCOUNTER — Emergency Department (HOSPITAL_COMMUNITY)
Admission: EM | Admit: 2024-09-21 | Discharge: 2024-09-21 | Disposition: A | Discharge status: Home / Self Care | Attending: Emergency Medicine | Admitting: Emergency Medicine

## 2024-09-21 DIAGNOSIS — R0602 Shortness of breath: Secondary | ICD-10-CM | POA: Insufficient documentation

## 2024-09-21 DIAGNOSIS — D72829 Elevated white blood cell count, unspecified: Secondary | ICD-10-CM | POA: Insufficient documentation

## 2024-09-21 DIAGNOSIS — E119 Type 2 diabetes mellitus without complications: Secondary | ICD-10-CM | POA: Insufficient documentation

## 2024-09-21 DIAGNOSIS — R7989 Other specified abnormal findings of blood chemistry: Secondary | ICD-10-CM | POA: Diagnosis not present

## 2024-09-21 DIAGNOSIS — I509 Heart failure, unspecified: Secondary | ICD-10-CM | POA: Diagnosis not present

## 2024-09-21 LAB — CBC WITH DIFFERENTIAL/PLATELET
Abs Immature Granulocytes: 0.18 K/uL — ABNORMAL HIGH (ref 0.00–0.07)
Basophils Absolute: 0.1 K/uL (ref 0.0–0.1)
Basophils Relative: 0 %
Eosinophils Absolute: 0.2 K/uL (ref 0.0–0.5)
Eosinophils Relative: 2 %
HCT: 30.1 % — ABNORMAL LOW (ref 36.0–46.0)
Hemoglobin: 9.6 g/dL — ABNORMAL LOW (ref 12.0–15.0)
Immature Granulocytes: 1 %
Lymphocytes Relative: 6 %
Lymphs Abs: 0.8 K/uL (ref 0.7–4.0)
MCH: 30 pg (ref 26.0–34.0)
MCHC: 31.9 g/dL (ref 30.0–36.0)
MCV: 94.1 fL (ref 80.0–100.0)
Monocytes Absolute: 1.1 K/uL — ABNORMAL HIGH (ref 0.1–1.0)
Monocytes Relative: 8 %
Neutro Abs: 11.6 K/uL — ABNORMAL HIGH (ref 1.7–7.7)
Neutrophils Relative %: 83 %
Platelets: 346 K/uL (ref 150–400)
RBC: 3.2 MIL/uL — ABNORMAL LOW (ref 3.87–5.11)
RDW: 14.3 % (ref 11.5–15.5)
WBC: 13.9 K/uL — ABNORMAL HIGH (ref 4.0–10.5)
nRBC: 0 % (ref 0.0–0.2)

## 2024-09-21 LAB — COMPREHENSIVE METABOLIC PANEL WITH GFR
ALT: 21 U/L (ref 0–44)
AST: 23 U/L (ref 15–41)
Albumin: 2.9 g/dL — ABNORMAL LOW (ref 3.5–5.0)
Alkaline Phosphatase: 88 U/L (ref 38–126)
Anion gap: 10 (ref 5–15)
BUN: 65 mg/dL — ABNORMAL HIGH (ref 6–20)
CO2: 21 mmol/L — ABNORMAL LOW (ref 22–32)
Calcium: 8 mg/dL — ABNORMAL LOW (ref 8.9–10.3)
Chloride: 105 mmol/L (ref 98–111)
Creatinine, Ser: 7.25 mg/dL — ABNORMAL HIGH (ref 0.44–1.00)
GFR, Estimated: 6 mL/min — ABNORMAL LOW (ref >60)
Glucose, Bld: 172 mg/dL — ABNORMAL HIGH (ref 70–99)
Potassium: 4.5 mmol/L (ref 3.5–5.1)
Sodium: 136 mmol/L (ref 135–145)
Total Bilirubin: 1.4 mg/dL — ABNORMAL HIGH (ref 0.0–1.2)
Total Protein: 6.8 g/dL (ref 6.5–8.1)

## 2024-09-21 LAB — RESP PANEL BY RT-PCR (RSV, FLU A&B, COVID)  RVPGX2
Influenza A by PCR: NEGATIVE
Influenza B by PCR: NEGATIVE
Resp Syncytial Virus by PCR: NEGATIVE
SARS Coronavirus 2 by RT PCR: NEGATIVE

## 2024-09-21 LAB — BRAIN NATRIURETIC PEPTIDE: B Natriuretic Peptide: 490.5 pg/mL — ABNORMAL HIGH (ref 0.0–100.0)

## 2024-09-21 MED ORDER — FUROSEMIDE 20 MG PO TABS: 60.0000 mg | ORAL_TABLET | Freq: Every day | ORAL | 0 refills | Status: AC

## 2024-09-21 MED ORDER — FUROSEMIDE 10 MG/ML IJ SOLN
20.0000 mg | Freq: Once | INTRAMUSCULAR | Status: AC
  Administered 2024-09-21: 20 mg via INTRAVENOUS
  Filled 2024-09-21: qty 2

## 2024-09-21 NOTE — ED Triage Notes (Signed)
 Pt BIB [facility] from [facility] due to shortness of breath.  Pt was getting a walking test to see if she needs to have oxygen at baseline but unable to complete test due to SpO2 dropping into low 80's.  Right sided fistula.  Pt blind.  Pt reports balance issues ongoing for months at this time; not related to blindness.  She was hospitalized and discharged 09/09/24.   VS BP 154/64, HR 66, Resp 20, CBG 186

## 2024-09-21 NOTE — ED Notes (Signed)
 Phlebotomy to recollect CMP sample

## 2024-09-21 NOTE — Discharge Instructions (Signed)
 You were seen in the emergency department today for concerns of shortness of breath and recent hospitalization.  Your labs and imaging were largely reassuring but your BNP level was somewhat elevated although not elevated enough for I would consider admission for congestive heart failure concerns.  I would recommend increasing your diuretic dose, Lasix , up to 60 mg daily for the next 5 days.  Please follow-up with your primary care provider for further evaluation.  Return to the emergency department for any concerns of new or worsening symptoms.

## 2024-09-21 NOTE — ED Notes (Signed)
 Phlebotomy at bedside.

## 2024-09-21 NOTE — ED Provider Notes (Signed)
 [place] EMERGENCY DEPARTMENT AT [facility] Provider Note   CSN: [id number] Arrival date & time: 09/21/24  1002     Patient presents with: No chief complaint on file.   Sarah Kline is a 58 y.o. female.  Patient with past medical history significant for type 2 diabetes, acute hypoxic respiratory failure, legally blind, anemia, renal failure, and CHF presents to the emergency department today with concerns of shortness of breath.  Reportedly was at her PCP at [facility] for evaluation given recent hospitalization and admittance for CHF.  She reports that she has been compliant with her home diuretic, Lasix .  She denies any significant feelings of shortness of breath but does notice some increasing difficulty with activity due to feelings of dyspnea.  She states that she was told to come in for evaluation is when they attempted to ambulate patient at the facility, she reportedly became hypoxic on room air.  She does not wear any oxygen at baseline.  HPI     Prior to Admission medications   Medication Sig Start Date End Date Taking? Authorizing Provider  furosemide  (LASIX ) 20 MG tablet Take 3 tablets (60 mg total) by mouth daily for 5 days. 09/21/24 09/26/24 Yes Chivas Notz A, PA-C  amLODipine  (NORVASC ) 10 MG tablet Take 1 tablet (10 mg total) by mouth daily. 09/10/24   Al-Sultani, Anmar, MD  atorvastatin  (LIPITOR ) 20 MG tablet Take 20 mg by mouth at bedtime. 08/03/24   [provider]  calcitRIOL  (ROCALTROL ) 0.25 MCG capsule Take 1 capsule (0.25 mcg total) by mouth every other day. 09/11/24   Al-Sultani, Anmar, MD  calcium  acetate (PHOSLO) 667 MG capsule Take 667 mg by mouth 3 (three) times daily. 07/20/24   [provider]  carvedilol  (COREG ) 12.5 MG tablet Take 12.5 mg by mouth 2 (two) times daily. 08/05/24   [provider]  furosemide  (LASIX ) 40 MG tablet Take 1 tablet (40 mg total) by mouth daily as needed for fluid or edema. Take 40 mg  by mouth as needed for weight gain or swelling. If symptoms do not improve, may increase to 80 mg as needed. 09/09/24 09/09/25  Al-Sultani, Anmar, MD  LANTUS SOLOSTAR 100 UNIT/ML Solostar Pen 10 Units at bedtime. 06/21/24   [provider]  losartan (COZAAR) 50 MG tablet Take 50 mg by mouth at bedtime. 06/23/24   [provider]  sodium bicarbonate  650 MG tablet Take 1,300 mg by mouth 2 (two) times daily. 06/02/18   [provider]  tamoxifen  (NOLVADEX ) 10 MG tablet TAKE 1 TABLET EVERY DAY 02/29/24   Odean Potts, MD    Allergies: Patient has no known allergies.    Review of Systems  Respiratory:  Positive for shortness of breath.   All other systems reviewed and are negative.   Updated Vital Signs BP (!) 146/77   Pulse 67   Temp 99.2 F (37.3 C) (Oral)   Resp 18   Ht 5' 4 (1.626 m)   Wt 108.4 kg   LMP 04/28/2018   SpO2 92%   BMI 41.02 kg/m   Physical Exam Vitals and nursing note reviewed.  Constitutional:      General: She is not in acute distress.    Appearance: She is well-developed.  HENT:     Head: Normocephalic and atraumatic.  Eyes:     Conjunctiva/sclera: Conjunctivae normal.  Cardiovascular:     Rate and Rhythm: Normal rate and regular rhythm.     Heart sounds: No murmur  heard. Pulmonary:     Effort: Pulmonary effort is normal. No respiratory distress.     Breath sounds: Normal breath sounds. No wheezing or rales.  Abdominal:     General: Abdomen is flat. Bowel sounds are normal.     Palpations: Abdomen is soft.     Tenderness: There is no abdominal tenderness. There is no guarding.  Musculoskeletal:        General: No swelling.     Cervical back: Neck supple.  Skin:    General: Skin is warm and dry.     Capillary Refill: Capillary refill takes less than 2 seconds.  Neurological:     Mental Status: She is alert.  Psychiatric:        Mood and Affect: Mood normal.     (all labs ordered are listed, but only abnormal results are  displayed) Labs Reviewed  CBC WITH DIFFERENTIAL/PLATELET - Abnormal; Notable for the following components:      Result Value   WBC 13.9 (*)    RBC 3.20 (*)    Hemoglobin 9.6 (*)    HCT 30.1 (*)    Neutro Abs 11.6 (*)    Monocytes Absolute 1.1 (*)    Abs Immature Granulocytes 0.18 (*)    All other components within normal limits  BRAIN NATRIURETIC PEPTIDE - Abnormal; Notable for the following components:   B Natriuretic Peptide 490.5 (*)    All other components within normal limits  COMPREHENSIVE METABOLIC PANEL WITH GFR - Abnormal; Notable for the following components:   CO2 21 (*)    Glucose, Bld 172 (*)    BUN 65 (*)    Creatinine, Ser 7.25 (*)    Calcium  8.0 (*)    Albumin 2.9 (*)    Total Bilirubin 1.4 (*)    GFR, Estimated 6 (*)    All other components within normal limits  RESP PANEL BY RT-PCR (RSV, FLU A&B, COVID)  [id number]    EKG: None  Radiology: DG Chest 2 View Result Date: 09/21/2024 CLINICAL DATA:  Cough EXAM: CHEST - 2 VIEW COMPARISON:  09/06/2024 FINDINGS: Mild central pulmonary vascular congestion. No focal airspace consolidation, pleural effusion, or pneumothorax. Mild cardiomegaly. No acute fracture or destructive lesions. Multilevel thoracic osteophytosis. IMPRESSION: Mild cardiomegaly and central pulmonary vascular congestion. No lobar pneumonia or pleural effusion. Electronically Signed   By: Rogelia Myers M.D.   On: 09/21/2024 11:47     Procedures   Medications Ordered in the ED  furosemide  (LASIX ) injection 20 mg (20 mg Intravenous Given 09/21/24 1219)                                    Medical Decision Making Amount and/or Complexity of Data Reviewed Labs: ordered. Radiology: ordered.  Risk Prescription drug management.   This patient presents to the ED for concern of shortness of breath, this involves an extensive number of treatment options, and is a complaint that carries with it a high risk of complications and morbidity.  The  differential diagnosis includes CHF, ACS, pneumonia, bronchitis, viral URI, influenza, COVID-19   Co morbidities that complicate the patient evaluation  Type 2 diabetes, acute hypoxic respiratory failure, legally blind, anemia, renal failure, CHF   Additional history obtained:  Additional history obtained from epic chart review   Lab Tests:  I Ordered, and personally interpreted labs.  The pertinent results include: CBC with mild leukocytosis at 13.9 which is  increased from a week ago at 6.4, BMP slightly elevated at 490 but improved compared to week prior, respiratory panel negative, CMP with baseline renal dysfunction with creatinine at 7.25 and GFR at 6   Imaging Studies ordered:  I ordered imaging studies including chest x-ray I independently visualized and interpreted imaging which showed Mild cardiomegaly and central pulmonary vascular congestion. No lobar pneumonia or pleural effusion. I agree with the radiologist interpretation   Cardiac Monitoring: / EKG:  The patient was maintained on a cardiac monitor.  I personally viewed and interpreted the cardiac monitored which showed an underlying rhythm of: Sinus rhythm   Consultations Obtained:  I requested consultation with none,  and discussed lab and imaging findings as well as pertinent plan - they recommend: N/A   Problem List / ED Course / Critical interventions / Medication management  Patient with past history significant for CHF, type 2 diabetes, acute hypoxic respiratory failure, legally blind, and anemia presents the emergency department concerns of shortness of breath.  She was reportedly at her PCP appointment when they attempted to ambulate patient and was noted to be hypoxic.  She states that she is not on any oxygen at baseline.  She denies any feelings of shortness of breath at rest.  States that she has been compliant with her Lasix .  No reported fever, chills, cough, congestion, or sore throat. Physical exam  is unremarkable.  No abnormal heart or lung sounds.  No appreciable lower extremity swelling or edema, no oropharyngeal erythema. Workup largely reassuring with BNP borderline at 490.5.  Given minimal shortness of breath reported, do not feel the patient acutely requires airway management.  SpO2 unremarkable on room air.  Appears the patient is having some persistent elevations in BNP.  Will increase Lasix  from 40 to 60 mg for the next 5 days.  Advised patient to follow-up closely with PCP for further assessment.  Otherwise stable at this time for outpatient follow-up and discharged home. I ordered medication including Lasix  for fluid retention Reevaluation of the patient after these medicines showed that the patient improved I have reviewed the patients home medicines and have made adjustments as needed   Social Determinants of Health:  None   Test / Admission - Considered:  Considered but stable for outpatient follow-up.  Final diagnoses:  Shortness of breath  Elevated brain natriuretic peptide (BNP) level    ED Discharge Orders          Ordered    furosemide  (LASIX ) 20 MG tablet  Daily        09/21/24 1514               Cecily Legrand LABOR, PA-C 09/21/24 1529    Bari Roxie HERO, DO 09/23/24 [phone]

## 2024-12-17 ENCOUNTER — Other Ambulatory Visit: Payer: Self-pay | Admitting: Hematology and Oncology

## 2024-12-19 ENCOUNTER — Telehealth: Payer: Self-pay | Admitting: Hematology and Oncology

## 2024-12-19 NOTE — Telephone Encounter (Signed)
 Called pt to schedule yearly f/u appt no answer and no vm. Scheduled an appt and sent out reminder letter- not active on mychart

## 2025-01-18 ENCOUNTER — Inpatient Hospital Stay: Admitting: Hematology and Oncology

## 2025-01-19 ENCOUNTER — Inpatient Hospital Stay: Admitting: Hematology and Oncology
